# Patient Record
Sex: Female | Born: 1975
Health system: Southern US, Community
[De-identification: ages and names within clinical notes are randomized; demographics above are authoritative.]

## PROBLEM LIST (undated history)

## (undated) DIAGNOSIS — J45909 Unspecified asthma, uncomplicated: Secondary | ICD-10-CM

## (undated) DIAGNOSIS — T7840XA Allergy, unspecified, initial encounter: Secondary | ICD-10-CM

## (undated) DIAGNOSIS — K219 Gastro-esophageal reflux disease without esophagitis: Secondary | ICD-10-CM

## (undated) DIAGNOSIS — F411 Generalized anxiety disorder: Secondary | ICD-10-CM

## (undated) DIAGNOSIS — E559 Vitamin D deficiency, unspecified: Secondary | ICD-10-CM

## (undated) HISTORY — PX: TONSILLECTOMY AND ADENOIDECTOMY: SUR1326

## (undated) HISTORY — DX: Unspecified asthma, uncomplicated: J45.909

## (undated) HISTORY — DX: Allergy, unspecified, initial encounter: T78.40XA

## (undated) HISTORY — DX: Vitamin D deficiency, unspecified: E55.9

## (undated) HISTORY — DX: Gastro-esophageal reflux disease without esophagitis: K21.9

## (undated) HISTORY — DX: Generalized anxiety disorder: F41.1

---

## 1998-12-25 ENCOUNTER — Other Ambulatory Visit: Admission: RE | Admit: 1998-12-25 | Discharge: 1998-12-25 | Payer: Self-pay | Admitting: Obstetrics and Gynecology

## 2000-01-07 ENCOUNTER — Other Ambulatory Visit: Admission: RE | Admit: 2000-01-07 | Discharge: 2000-01-07 | Payer: Self-pay | Admitting: *Deleted

## 2000-06-08 ENCOUNTER — Ambulatory Visit (HOSPITAL_COMMUNITY): Admission: RE | Admit: 2000-06-08 | Discharge: 2000-06-08 | Payer: Self-pay | Admitting: Obstetrics and Gynecology

## 2000-06-08 ENCOUNTER — Encounter: Payer: Self-pay | Admitting: Obstetrics and Gynecology

## 2000-06-22 ENCOUNTER — Emergency Department (HOSPITAL_COMMUNITY): Admission: EM | Admit: 2000-06-22 | Discharge: 2000-06-22 | Payer: Self-pay | Admitting: Emergency Medicine

## 2000-07-02 ENCOUNTER — Emergency Department (HOSPITAL_COMMUNITY): Admission: EM | Admit: 2000-07-02 | Discharge: 2000-07-02 | Payer: Self-pay | Admitting: Emergency Medicine

## 2001-01-12 ENCOUNTER — Other Ambulatory Visit: Admission: RE | Admit: 2001-01-12 | Discharge: 2001-01-12 | Payer: Self-pay | Admitting: Obstetrics and Gynecology

## 2001-02-10 ENCOUNTER — Encounter (INDEPENDENT_AMBULATORY_CARE_PROVIDER_SITE_OTHER): Payer: Self-pay | Admitting: *Deleted

## 2001-02-10 ENCOUNTER — Ambulatory Visit (HOSPITAL_COMMUNITY): Admission: RE | Admit: 2001-02-10 | Discharge: 2001-02-10 | Payer: Self-pay

## 2002-04-05 ENCOUNTER — Encounter: Admission: RE | Admit: 2002-04-05 | Discharge: 2002-04-05 | Payer: Self-pay | Admitting: Obstetrics and Gynecology

## 2002-06-20 ENCOUNTER — Inpatient Hospital Stay (HOSPITAL_COMMUNITY): Admission: AD | Admit: 2002-06-20 | Discharge: 2002-06-23 | Payer: Self-pay | Admitting: Obstetrics & Gynecology

## 2002-07-18 ENCOUNTER — Other Ambulatory Visit: Admission: RE | Admit: 2002-07-18 | Discharge: 2002-07-18 | Payer: Self-pay | Admitting: Obstetrics & Gynecology

## 2003-07-05 ENCOUNTER — Other Ambulatory Visit: Admission: RE | Admit: 2003-07-05 | Discharge: 2003-07-05 | Payer: Self-pay | Admitting: Obstetrics and Gynecology

## 2004-07-05 ENCOUNTER — Other Ambulatory Visit: Admission: RE | Admit: 2004-07-05 | Discharge: 2004-07-05 | Payer: Self-pay | Admitting: Obstetrics and Gynecology

## 2005-07-25 ENCOUNTER — Other Ambulatory Visit: Admission: RE | Admit: 2005-07-25 | Discharge: 2005-07-25 | Payer: Self-pay | Admitting: Obstetrics and Gynecology

## 2008-09-30 ENCOUNTER — Emergency Department (HOSPITAL_COMMUNITY): Admission: EM | Admit: 2008-09-30 | Discharge: 2008-09-30 | Payer: Self-pay | Admitting: Emergency Medicine

## 2008-10-05 ENCOUNTER — Ambulatory Visit (HOSPITAL_COMMUNITY): Admission: RE | Admit: 2008-10-05 | Discharge: 2008-10-05 | Payer: Self-pay | Admitting: Gastroenterology

## 2008-10-13 ENCOUNTER — Encounter: Admission: RE | Admit: 2008-10-13 | Discharge: 2008-10-13 | Payer: Self-pay | Admitting: Gastroenterology

## 2008-11-08 ENCOUNTER — Ambulatory Visit (HOSPITAL_COMMUNITY): Admission: RE | Admit: 2008-11-08 | Discharge: 2008-11-08 | Payer: Self-pay | Admitting: Gastroenterology

## 2009-01-04 ENCOUNTER — Encounter: Admission: RE | Admit: 2009-01-04 | Discharge: 2009-01-04 | Payer: Self-pay | Admitting: Gastroenterology

## 2010-07-05 IMAGING — US US ABDOMEN COMPLETE
1 series · 14 of 25 positions shown · non-contrast
Comparison: Abdominal radiographs 10/05/2008.

CLINICAL DATA: Right upper quadrant abdominal pain.

ABDOMEN ULTRASOUND
TECHNIQUE: Complete abdominal ultrasound examination was performed
including evaluation of the liver, gallbladder, bile ducts,
pancreas, kidneys, spleen, IVC, and abdominal aorta.

[Series 1: unknown · 0.28mm/px · 14 of 60 slices shown]
[im 1/60]
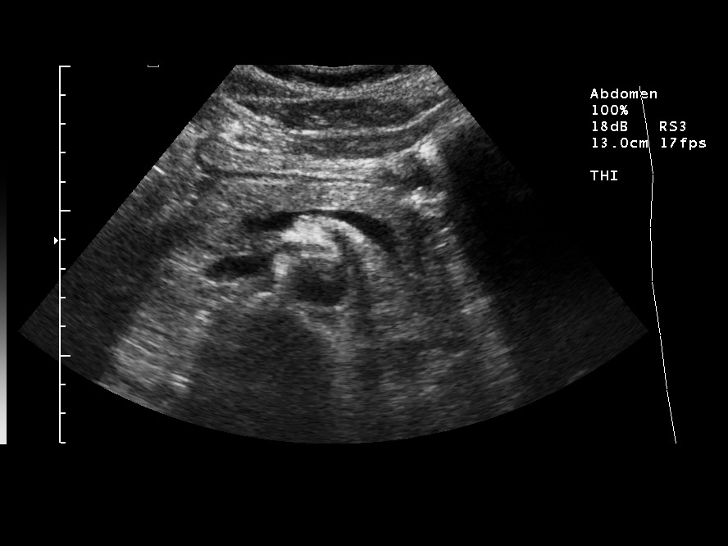
[im 5/60]
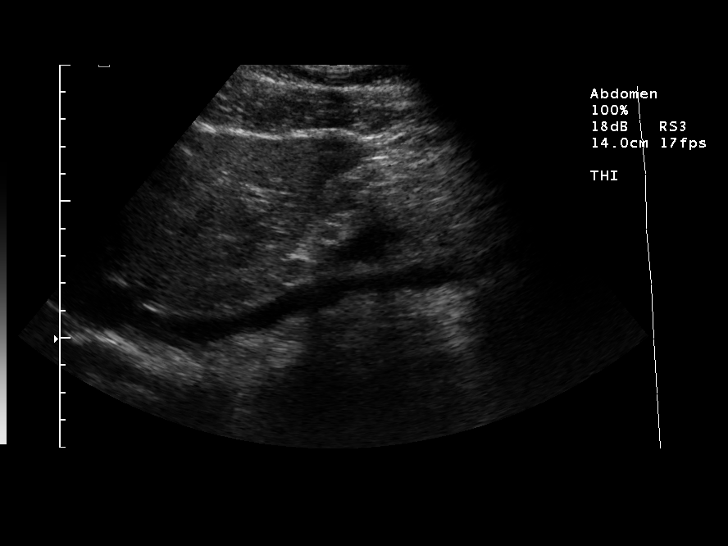
[im 10/60]
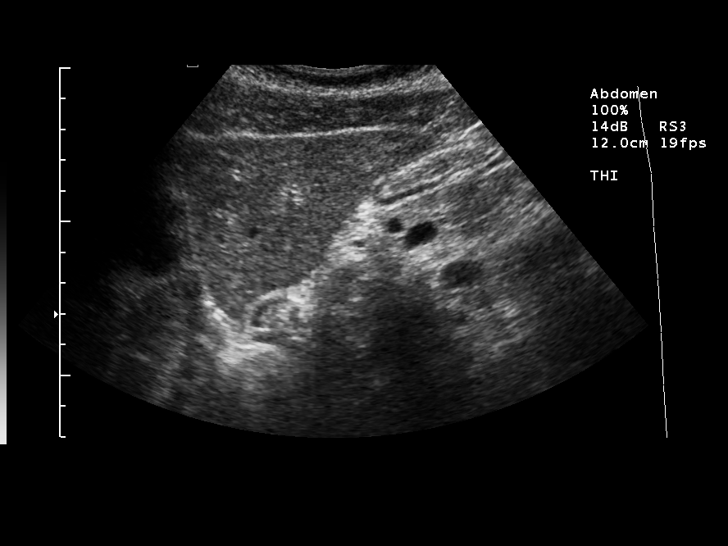
[im 15/60]
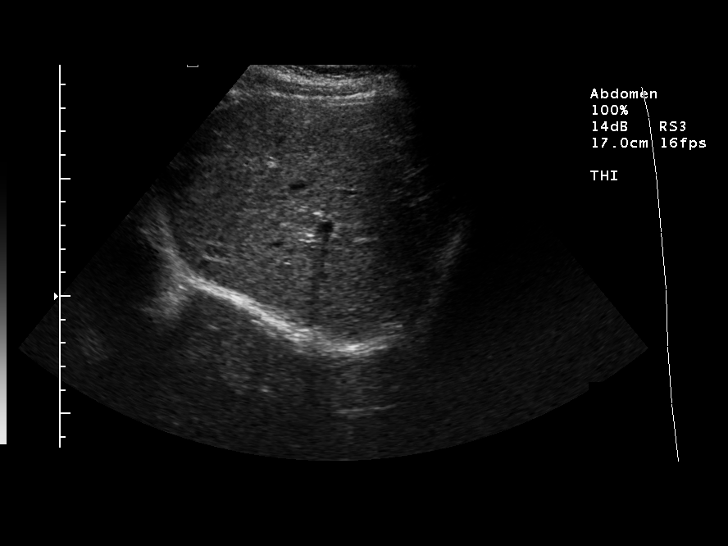
[im 20/60]
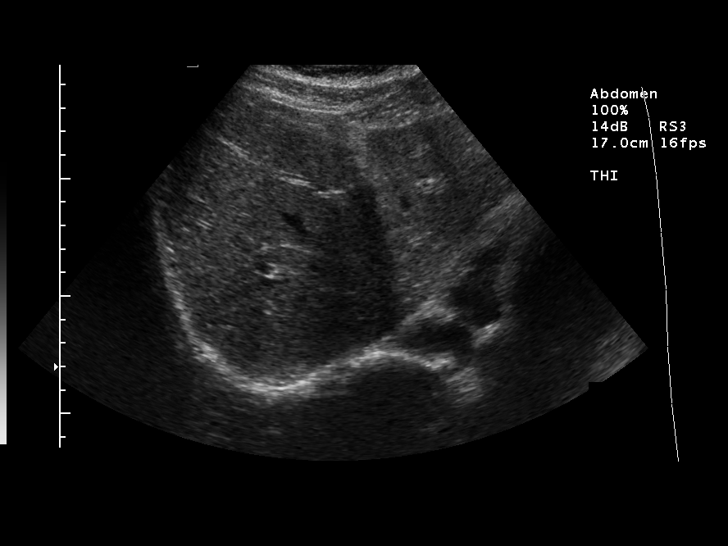
[im 23/60]
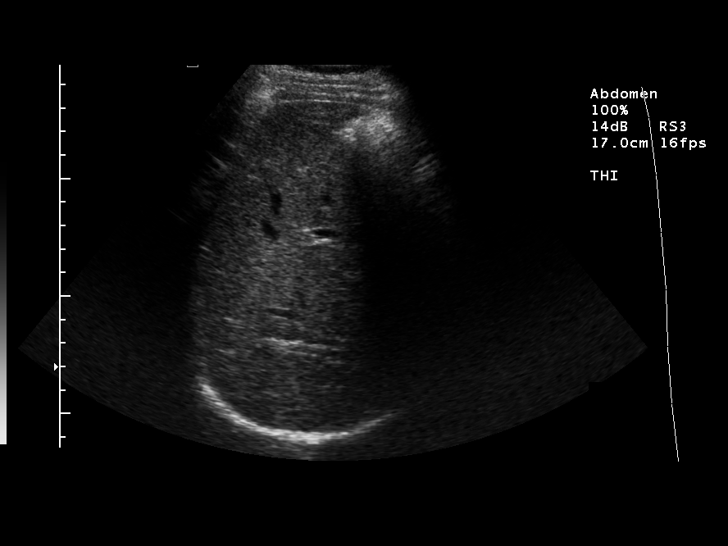
[im 28/60]
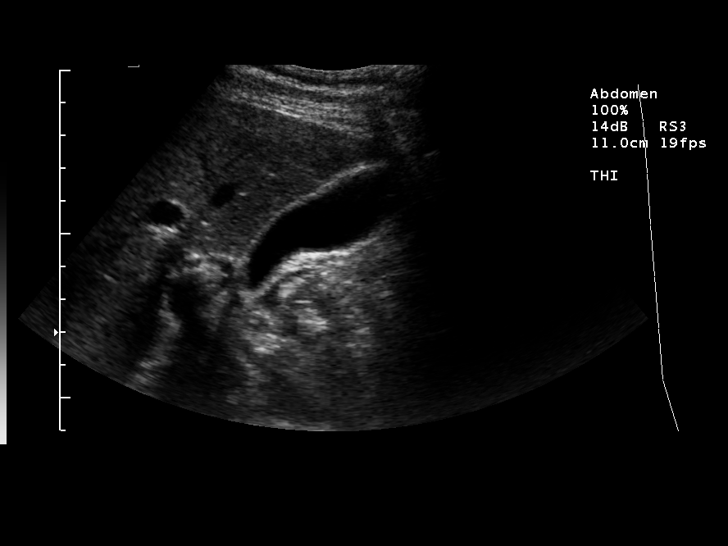
[im 32/60]
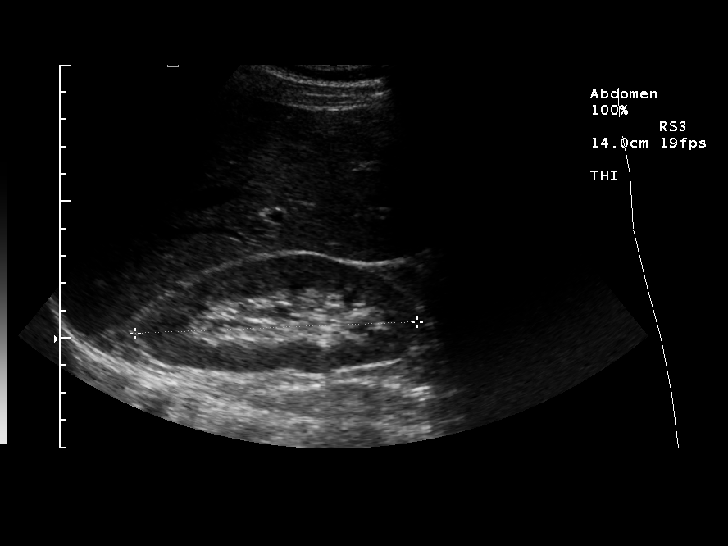
[im 37/60]
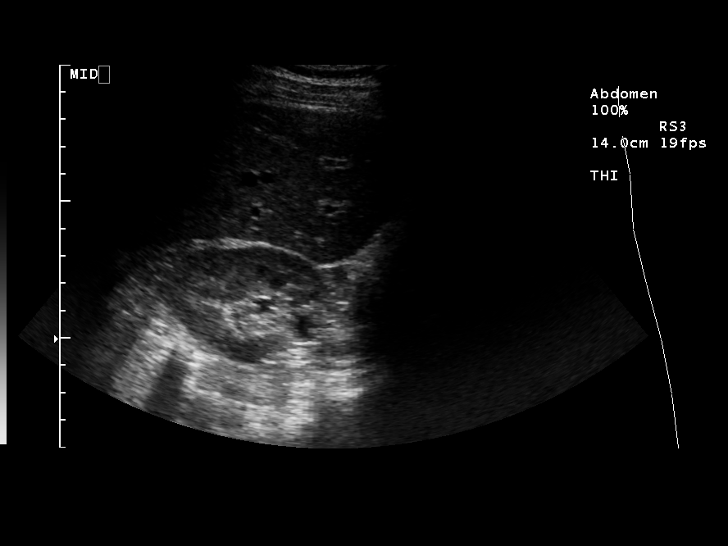
[im 40/60]
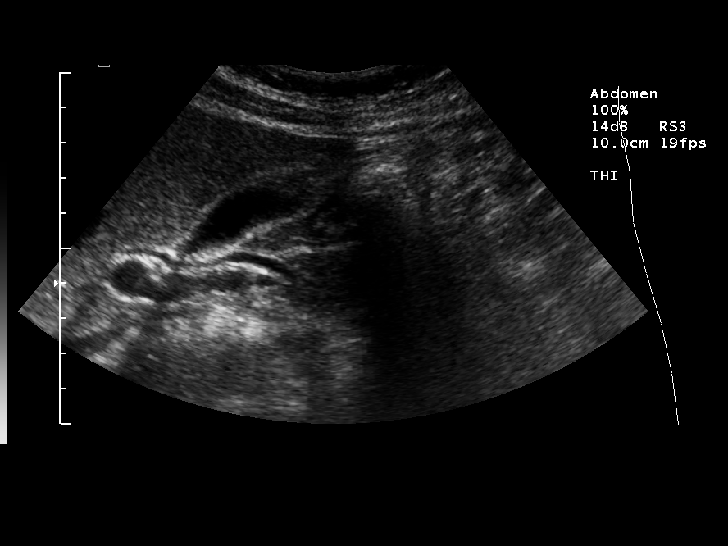
[im 45/60]
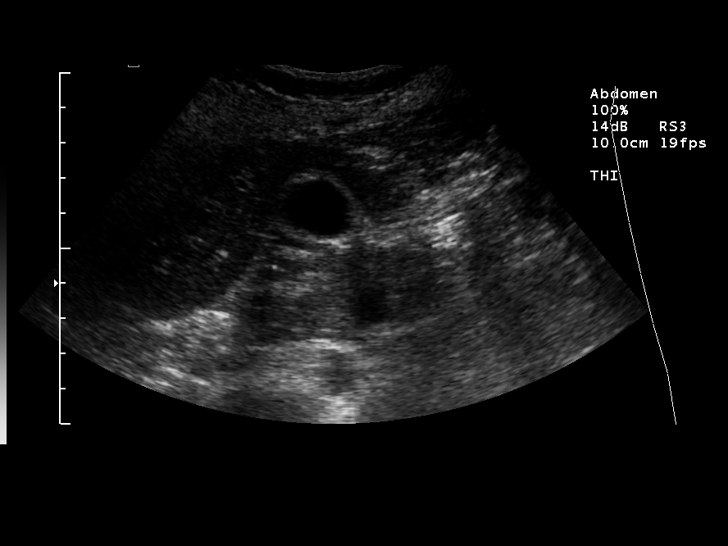
[im 50/60]
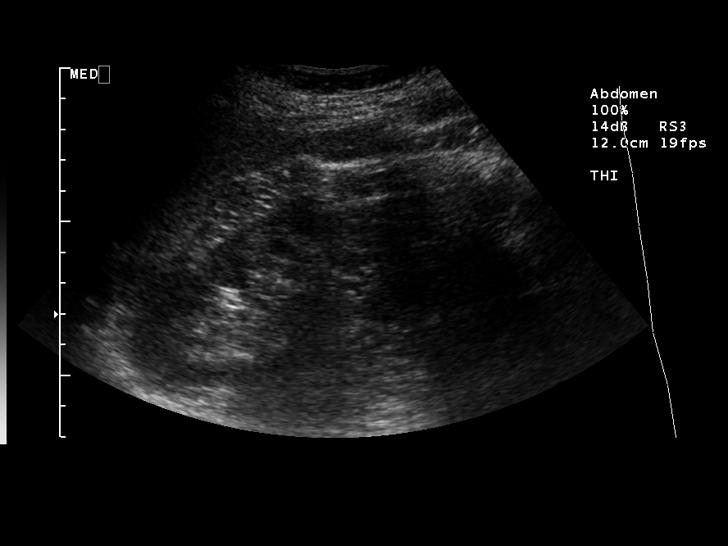
[im 55/60]
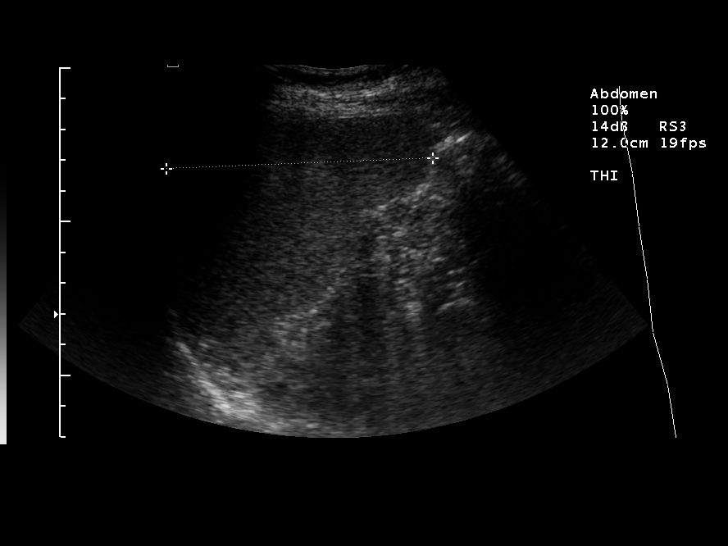
[im 60/60]
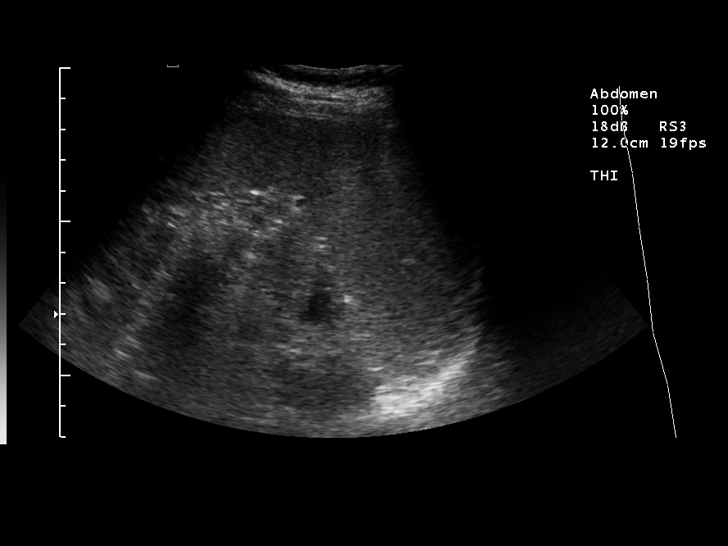

[14 of 25 positions shown; findings below may reference images not displayed]

FINDINGS: The gallbladder appears normal without gallstones or wall
thickening.  There is no biliary dilatation.  The visualized
portions of the liver, spleen, pancreas, IVC and abdominal aorta
appear normal.

Both kidneys appear normal.  The right kidney measures 10.3 cm in
length and the left kidney measures 11.8 cm in length.  There is no
hydronephrosis.
IMPRESSION: No acute abdominal findings.

## 2010-12-15 ENCOUNTER — Encounter: Payer: Self-pay | Admitting: Gastroenterology

## 2011-04-11 NOTE — Discharge Summary (Signed)
   NAMELAUREL, HARNDEN                         ACCOUNT NO.:  [id number]   MEDICAL RECORD NO.:  [id number]                   PATIENT TYPE:  INP   LOCATION:  9102                                 FACILITY:  [facility]   PHYSICIAN:  Malva Limes, MD                   DATE OF BIRTH:  [birth year]   DATE OF ADMISSION:  06/20/2002  DATE OF DISCHARGE:  06/23/2002                                 DISCHARGE SUMMARY   FINAL DIAGNOSES:  Intrauterine pregnancy at term, breech presentation.   PROCEDURE:  Primary low-transverse cesarean section.   SURGEON:  Gerrit Friends. Aldona Bar, M.D.   ASSISTANT:  Lodema Hong, M.D.   COMPLICATIONS:  None.   HISTORY:  This 35 year old G1, P0 was admitted to the hospital on June 20, 2002 for cesarean section secondary to breech presentation.  Patient was  taken to the operating room where primary low-transverse cesarean section  was performed with the delivery of a 7 pound 9 ounce female infant with  Apgar's of 9 and 10.  She delivered without complications.  Patient's  postoperative course was complicated by some postoperative anemia.  Her  hemoglobin was 8.3, which was stable and she was started on some iron.  She  was felt ready for discharge on postoperative day 3.   DISCHARGE INSTRUCTIONS:  1. She was sent home on a regular diet.  2. She was told to decrease activities.  3. She was told to continue prenatal vitamins.  4. Prescription for Tylox to take one to two every 4 hours as needed for     pain.   FOLLOW UP:  She is to follow up in the office in four weeks.      Michelle B. Rialto, PA-C                Malva Limes, MD    MBB/MEDQ  D:  07/26/2002  T:  07/26/2002  Job:  [id number]

## 2011-04-11 NOTE — Op Note (Signed)
Christine [name], Christine [name]                         ACCOUNT NO.:  [id number]   MEDICAL RECORD NO.:  [id number]                   PATIENT TYPE:  INP   LOCATION:  9102                                 FACILITY:  [facility]   PHYSICIAN:  Gerrit Friends. Aldona Bar, M.D.                DATE OF BIRTH:  Nov 10, 1976   DATE OF PROCEDURE:  06/20/2002  DATE OF DISCHARGE:  06/23/2002                                 OPERATIVE REPORT   PREOPERATIVE DIAGNOSES:  Primigravida breech at term.   POSTOPERATIVE DIAGNOSES:  Primigravida breech at term.  Plus delivery of 7  pound 9 ounce female infant, Apgars 9 and 10.   PROCEDURE:  Primary low transverse cesarean section.   SURGEON:  Gerrit Friends. Aldona Bar, M.D.   ASSISTANT:  Luvenia Redden, M.D.   ANESTHESIA:  Spinal by Quillian Quince, M.D.   INDICATIONS FOR PROCEDURE:  This 35 year old primigravida was known to have  a breech presentation and presents at this time for delivery by primary low  transverse cesarean section having had all options discussed with her in the  office. She is taken to the operating room for delivery.   DESCRIPTION OF PROCEDURE:  The patient was taken to the operating room where  after the satisfactory induction of spinal anesthetic by Dr. Arby Barrette, she  was prepped and draped in the usual sterile fashion.  A Foley catheter was  placed as part of the prep.  Once good anesthetic levels were documented,  with the patient being placed in the supine position slightly tilted to the  left, the procedure was begun.  The Pfannenstiel incision was made and with  minimal difficulty and good hemostasis, was dissected down to and through  the fascia in a low transverse fashion. Subfascial space was created  inferiorly and superiorly.  The muscles were separated in the midline.  The  peritoneum was identified and entered appropriately with care taken to avoid  the bowel superiorly and the bladder inferiorly.  At this time, the  vesicouterine peritoneum was incised in  a low transverse fashion and pushed  off the lower uterine segment with ease.  Sharp incision to the uterus in  the low transverse fashion was then carried out with Metzenbaum scissors and  extended laterally.  Amniotomy was produced with production of clear fluid.  Thereafter the baby was delivered from the frank breech presentation.  The  shoulders were delivered without difficulty using the modified Love seat  maneuver.  Once the infant had been delivered and the cord had been clamped  and cut, the infant was passed off to the awaiting team.  Apgars were noted  to be 9 and 10.  Subsequent weight was found to be 7 pounds 9 ounces.  The  baby was taken to the nursery in good condition. After the cord blood was  collected, the placenta was delivered intact.  The uterus was then  exteriorized  and rendered free of any remaining products of conception.  Good uterine contractility was afforded with slowly given intravenous  Pitocin and manual stimulation.  Closure of the uterus was carried out using  a single layer of #1 Vicryl in a running locking fashion and this was  reinforced with several figure-of-eight #1 Vicryls.  Tubes and ovaries  appeared normal.  At this time, the incision was noted to be dry, and the  uterus was replaced in the abdominal cavity after the abdomen lavaged of all  free blood and clot.  At this time after all counts were noted to be correct  and no foreign bodies were noted to be remaining in the abdominal cavity,  closure of the abdomen was begun in layers.  The abdominal peritoneum was  closed with 0 Vicryl in a running fashion and the muscles secured with the  same.  Assured of good subfascial hemostasis, the fascia was reapproximated  with 0 Vicryl from angle to midline bilaterally.  The subcutaneous tissue  was rendered hemostatic and staples were then used to close the skin.  A  sterile pressure dressing was applied and the patient at this time was  transported  to the recovery room in satisfactory condition having tolerated  the procedure well.  Estimated blood loss was 500 cc.  All needle, sponge,  and instrument count correct x2. In summary, this patient was a primigravida  breech and was delivered at term by primary low transverse cesarean section.  At the conclusion of the procedure, both mother and baby were doing well in  their respective recovery areas.                                                Gerrit Friends. Aldona Bar, M.D.    RMW/MEDQ  D:  06/20/2002  T:  06/23/2002  Job:  [id number]

## 2011-04-11 NOTE — Op Note (Signed)
[facility]  Patient:    Christine [name], Christine [name]                      MRN: [id number] Adm. Date:  [id number] Attending:  Silverio Lay A                           Operative Report  PREOPERATIVE DIAGNOSIS:       Infertility with questionable hysterosalpingogram.  POSTOPERATIVE DIAGNOSES:      1. Left paratubal cyst.                               2. Bilateral tubal patency.                               3. Posterior cul-de-sac peritoneal retraction                                  with possible endometriosis.  PROCEDURES:                   1. Diagnostic laparoscopy with peritoneal                                  biopsy x 3.                               2. Chromopertubation.  SURGEON:                      Silverio Lay, M.D.  ANESTHESIA:                   General.  ESTIMATED BLOOD LOSS:         Minimal.  DESCRIPTION OF PROCEDURE:     After being informed of the planned procedure with the possible complications including bleeding; infection; injury to bowel, bladder or ureter and the need for laparotomy, informed consent was obtained.  The patient was taken to OR #2, given general anesthesia with endotracheal intubation and placed in the lithotomy position,  She was prepped and draped in a sterile fashion and the bladder was emptied with a Foley catheter.  GYN exam revealed an anteverted uterus, normal in size and shape; two normal adnexa.  A speculum was inserted.  The anterior lip of the cervix was grasped with a tenaculum and an acorn manipulator was inserted, which was hooked to a line for methylene blue chromopertubation.  We then proceeded with an umbilical incision was 1 cm with a knife, insertion of a Veress needle and insufflation of pneumoperitoneum with a maximum pressure of 15 mmHg CO2.  We then tried to insert a 10 mm trocar, but the patients muscle habitus and thinness made it difficult to safely push in a 10 mm trocar while we were unable  to pull up her abdominal wall.  We then decided to proceed with an open approach.  Using skin retractors, we directly visualized fascia, which was opened with the knife.  Two sutures of 0 Vicryl were placed on each angle. The peritoneum was then entered bluntly with a hemostat clamp.  A Hasson 10 mm trocar  was then placed and held in place with the two sutures previously placed.  We then proceeded with reinsufflation of pneumoperitoneum and could insert a camera mounted on the video monitor.  OBSERVATION:                  The anterior cul-de-sac was normal.  The uterus was normal in shape and size.  Both tubes were normal except for the small paratubal cyst on the left side measuring 0.5 cm.  Both ovaries were normal. The posterior cul-de-sac showed on the left uterosacral ligament three areas of peritoneal retraction with no definite lesion.  Because these retractions could very well be signs of minimal endometriosis, we decided to proceed with biopsy and a 5 mm trocar was inserted suprapubically under direct visualization.  We proceeded with punch biopsy to do three small peritoneal biopsies without significant bleeding.  Using bipolar cautery, we then cauterized those three sites.  We finally proceeded with methylene blue chromopertubation, which revealed two patent tubes without any difficulty in filling.  We then removed trocars after evacuating the pneumoperitoneum, closed the previously placed sutures on the fascia of the umbilical incision after assessing that no bowel loop was in the incision.  The skin of both incisions was closed with Dermabond after infiltrating with Marcaine 0.25, 8 cc.  Instrument and sponge count was complete x 2.  Estimated blood loss was minimal.  The procedure was very well tolerated by the patient, without was taken to the recovery room in a well and stable condition. DD:  02/10/01 TD:  02/11/01 Job: [id number]

## 2013-02-10 ENCOUNTER — Ambulatory Visit (INDEPENDENT_AMBULATORY_CARE_PROVIDER_SITE_OTHER): Payer: 59 | Admitting: General Practice

## 2013-02-10 ENCOUNTER — Encounter: Payer: Self-pay | Admitting: General Practice

## 2013-02-10 VITALS — BP 138/76 | HR 78 | Temp 97.2°F | Ht 65.0 in | Wt 176.0 lb

## 2013-02-10 DIAGNOSIS — F411 Generalized anxiety disorder: Secondary | ICD-10-CM

## 2013-02-10 DIAGNOSIS — J45909 Unspecified asthma, uncomplicated: Secondary | ICD-10-CM

## 2013-02-10 DIAGNOSIS — E559 Vitamin D deficiency, unspecified: Secondary | ICD-10-CM | POA: Insufficient documentation

## 2013-02-10 DIAGNOSIS — J4530 Mild persistent asthma, uncomplicated: Secondary | ICD-10-CM | POA: Insufficient documentation

## 2013-02-10 MED ORDER — CITALOPRAM HYDROBROMIDE 20 MG PO TABS: 20.0000 mg | ORAL_TABLET | Freq: Every day | ORAL | Status: DC

## 2013-02-10 NOTE — Progress Notes (Signed)
  Subjective:    Patient ID: Christine [name], female    DOB: 1976-09-22, 37 y.o.   MRN: [id number]  Asthma She complains of cough, sputum production and wheezing. There is no chest tightness. Primary symptoms comments: Reports this only occurs when upset or very anxious . This is a chronic problem. The current episode started more than 1 year ago. The problem occurs rarely. The problem has been rapidly improving. The cough is productive of sputum. Pertinent negatives include no chest pain, dyspnea on exertion, sneezing or sore throat. Her symptoms are aggravated by emotional stress (allergens). Her symptoms are alleviated by rest (anti-anxiety med). She reports significant improvement on treatment. There are no known risk factors for lung disease. Her past medical history is significant for asthma.  Anxiety Symptoms include excessive worry and nervous/anxious behavior. Patient reports no chest pain, confusion, dizziness, nausea or suicidal ideas. Symptoms occur occasionally. The severity of symptoms is moderate. The patient sleeps 6 hours per night. The quality of sleep is fair. Nighttime awakenings: occasional.   Her past medical history is significant for asthma. Compliance with medications is 76-100%.  Patient expressed concern about weight of 176lb, which is her pregancy weight. Verbalizes she feels better around 130lbs.     Review of Systems  HENT: Negative for sore throat and sneezing.   Respiratory: Positive for cough, sputum production and wheezing.   Cardiovascular: Negative for chest pain and dyspnea on exertion.  Gastrointestinal: Negative for nausea.  Skin: Negative.   Neurological: Negative for dizziness.  Psychiatric/Behavioral: Negative for suicidal ideas and confusion. The patient is nervous/anxious.        Objective:   Physical Exam  Constitutional: She is oriented to person, place, and time. She appears well-developed and well-nourished.  Eyes: Pupils are equal, round, and  reactive to light.  Neck: Normal range of motion.  Cardiovascular: Normal rate, regular rhythm and normal heart sounds.   No murmur heard. Pulmonary/Chest: Effort normal and breath sounds normal.  Abdominal: Soft.  Musculoskeletal: Normal range of motion.  Neurological: She is alert and oriented to person, place, and time. She has normal reflexes.  Skin: Skin is warm and dry.  Psychiatric: She has a normal mood and affect.   Gad-7 score=2       Assessment & Plan:  Continue current medications as prescribed Discussed increase in physical activity, healthy eating habits for weight management discussed stress management   Raymon Mutton, FNP-C

## 2013-02-10 NOTE — Patient Instructions (Signed)
Anxiety and Panic Attacks Your caregiver has informed you that you are having an anxiety or panic attack. There may be many forms of this. Most of the time these attacks come suddenly and without warning. They come at any time of day, including periods of sleep, and at any time of life. They may be strong and unexplained. Although panic attacks are very scary, they are physically harmless. Sometimes the cause of your anxiety is not known. Anxiety is a protective mechanism of the body in its fight or flight mechanism. Most of these perceived danger situations are actually nonphysical situations (such as anxiety over losing a job). CAUSES  The causes of an anxiety or panic attack are many. Panic attacks may occur in otherwise healthy people given a certain set of circumstances. There may be a genetic cause for panic attacks. Some medications may also have anxiety as a side effect. SYMPTOMS  Some of the most common feelings are:  Intense terror.  Dizziness, feeling faint.  Hot and cold flashes.  Fear of going crazy.  Feelings that nothing is real.  Sweating.  Shaking.  Chest pain or a fast heartbeat (palpitations).  Smothering, choking sensations.  Feelings of impending doom and that death is near.  Tingling of extremities, this may be from over-breathing.  Altered reality (derealization).  Being detached from yourself (depersonalization). Several symptoms can be present to make up anxiety or panic attacks. DIAGNOSIS  The evaluation by your caregiver will depend on the type of symptoms you are experiencing. The diagnosis of anxiety or panic attack is made when no physical illness can be determined to be a cause of the symptoms. TREATMENT  Treatment to prevent anxiety and panic attacks may include:  Avoidance of circumstances that cause anxiety.  Reassurance and relaxation.  Regular exercise.  Relaxation therapies, such as yoga.  Psychotherapy with a psychiatrist or  therapist.  Avoidance of caffeine, alcohol and illegal drugs.  Prescribed medication. SEEK IMMEDIATE MEDICAL CARE IF:   You experience panic attack symptoms that are different than your usual symptoms.  You have any worsening or concerning symptoms. Document Released: 11/10/2005 Document Revised: 02/02/2012 Document Reviewed: 03/14/2010 [facility] Patient Information 2013 [place], [place].   Asthma, Adult Asthma is caused by narrowing of the air passages in the lungs. It may be triggered by pollen, dust, animal dander, molds, some foods, respiratory infections, exposure to smoke, exercise, emotional stress or other allergens (things that cause allergic reactions or allergies). Repeat attacks are common. HOME CARE INSTRUCTIONS   Use prescription medications as ordered by your caregiver.  Avoid pollen, dust, animal dander, molds, smoke and other things that cause attacks at home and at work.  You may have fewer attacks if you decrease dust in your home. Electrostatic air cleaners may help.  It may help to replace your pillows or mattress with materials less likely to cause allergies.  Talk to your caregiver about an action plan for managing asthma attacks at home, including, the use of a peak flow meter which measures the severity of your asthma attack. An action plan can help minimize or stop the attack without having to seek medical care.  If you are not on a fluid restriction, drink 8 to 10 glasses of water each day.  Always have a plan prepared for seeking medical attention, including, calling your physician, accessing local emergency care, and calling 911 (in the [place] for a severe attack.  Discuss possible exercise routines with your caregiver.  If animal dander is the cause  of asthma, you may need to get rid of pets. SEEK MEDICAL CARE IF:   You have wheezing and shortness of breath even if taking medicine to prevent attacks.  You have muscle aches, chest pain or thickening of  sputum.  Your sputum changes from clear or white to yellow, green, gray, or bloody.  You have any problems that may be related to the medicine you are taking (such as a rash, itching, swelling or trouble breathing). SEEK IMMEDIATE MEDICAL CARE IF:   Your usual medicines do not stop your wheezing or there is increased coughing and/or shortness of breath.  You have increased difficulty breathing.  You have a fever. MAKE SURE YOU:   Understand these instructions.  Will watch your condition.  Will get help right away if you are not doing well or get worse. Document Released: 11/10/2005 Document Revised: 02/02/2012 Document Reviewed: 06/28/2008 [facility] Patient Information 2013 [place], [place].

## 2013-05-09 ENCOUNTER — Ambulatory Visit: Payer: 59 | Admitting: Nurse Practitioner

## 2013-06-14 ENCOUNTER — Other Ambulatory Visit: Payer: Self-pay | Admitting: *Deleted

## 2013-06-14 DIAGNOSIS — F411 Generalized anxiety disorder: Secondary | ICD-10-CM

## 2013-06-14 MED ORDER — CITALOPRAM HYDROBROMIDE 20 MG PO TABS: 20.0000 mg | ORAL_TABLET | Freq: Every day | ORAL | Status: DC

## 2013-06-14 NOTE — Telephone Encounter (Signed)
LAST OV 3/14

## 2013-07-06 ENCOUNTER — Ambulatory Visit (INDEPENDENT_AMBULATORY_CARE_PROVIDER_SITE_OTHER): Payer: BC Managed Care – PPO | Admitting: Nurse Practitioner

## 2013-07-06 VITALS — BP 134/74 | HR 78 | Temp 97.8°F | Ht 65.0 in | Wt 184.0 lb

## 2013-07-06 DIAGNOSIS — F32A Depression, unspecified: Secondary | ICD-10-CM

## 2013-07-06 DIAGNOSIS — F411 Generalized anxiety disorder: Secondary | ICD-10-CM

## 2013-07-06 DIAGNOSIS — F3289 Other specified depressive episodes: Secondary | ICD-10-CM

## 2013-07-06 DIAGNOSIS — F329 Major depressive disorder, single episode, unspecified: Secondary | ICD-10-CM

## 2013-07-06 MED ORDER — CITALOPRAM HYDROBROMIDE 20 MG PO TABS: 20.0000 mg | ORAL_TABLET | Freq: Every day | ORAL | Status: DC

## 2013-07-06 NOTE — Progress Notes (Signed)
  Subjective:    Patient ID: Christine [name], female    DOB: [birth year], 37 y.o.   MRN: [id number]  HPI  Patinet here today for refill on celexa- she has been taking for awhile for depression- Patient says that it helps with her anxiety- keeps her calm- Wants to remain on current dose.    Review of Systems  All other systems reviewed and are negative.       Objective:   Physical Exam  Constitutional: She appears well-developed and well-nourished.  Cardiovascular: Normal rate and normal heart sounds.   Pulmonary/Chest: Effort normal and breath sounds normal.  Psychiatric: She has a normal mood and affect. Her behavior is normal. Judgment and thought content normal.    BP 134/74  Pulse 78  Temp(Src) 97.8 F (36.6 C) (Oral)  Ht 5\' 5"  (1.651 m)  Wt 184 lb (83.462 kg)  BMI 30.62 kg/m2  LMP 07/06/2013       Assessment & Plan:   1. Depression   2. GAD (generalized anxiety disorder)   3. Generalized anxiety disorder    Meds ordered this encounter  Medications  . citalopram (CELEXA) 20 MG tablet    Sig: Take 1 tablet (20 mg total) by mouth daily.    Dispense:  30 tablet    Refill:  11    Order Specific Question:  Supervising Provider    Answer:  Ernestina Penna [[id number]]   Stress management Flo[place] up in 6  Months  Mary-Margaret Daphine Deutscher, FNP

## 2013-07-06 NOTE — Patient Instructions (Signed)

## 2013-07-08 ENCOUNTER — Ambulatory Visit: Payer: 59 | Admitting: Nurse Practitioner

## 2013-08-08 ENCOUNTER — Telehealth: Payer: Self-pay | Admitting: Nurse Practitioner

## 2013-08-08 MED ORDER — AZITHROMYCIN 250 MG PO TABS: ORAL_TABLET | ORAL | Status: DC

## 2013-08-08 NOTE — Telephone Encounter (Signed)
zpak rx sent to pharmacy 

## 2013-08-08 NOTE — Telephone Encounter (Signed)
Please advise 

## 2014-03-07 ENCOUNTER — Ambulatory Visit (INDEPENDENT_AMBULATORY_CARE_PROVIDER_SITE_OTHER): Payer: BC Managed Care – PPO | Admitting: General Practice

## 2014-03-07 VITALS — BP 147/92 | HR 73 | Temp 97.4°F | Wt 194.6 lb

## 2014-03-07 DIAGNOSIS — W57XXXA Bitten or stung by nonvenomous insect and other nonvenomous arthropods, initial encounter: Secondary | ICD-10-CM

## 2014-03-07 DIAGNOSIS — J209 Acute bronchitis, unspecified: Secondary | ICD-10-CM

## 2014-03-07 DIAGNOSIS — T148 Other injury of unspecified body region: Secondary | ICD-10-CM

## 2014-03-07 DIAGNOSIS — L0390 Cellulitis, unspecified: Secondary | ICD-10-CM

## 2014-03-07 DIAGNOSIS — L0291 Cutaneous abscess, unspecified: Secondary | ICD-10-CM

## 2014-03-07 MED ORDER — PREDNISONE (PAK) 10 MG PO TABS: ORAL_TABLET | ORAL | Status: DC

## 2014-03-07 MED ORDER — DOXYCYCLINE HYCLATE 100 MG PO TABS: ORAL_TABLET | ORAL | Status: DC

## 2014-03-07 MED ORDER — METHYLPREDNISOLONE ACETATE 80 MG/ML IJ SUSP
80.0000 mg | Freq: Once | INTRAMUSCULAR | Status: AC
  Administered 2014-03-07: 80 mg via INTRAMUSCULAR

## 2014-03-07 NOTE — Patient Instructions (Signed)

## 2014-03-07 NOTE — Progress Notes (Signed)
   Subjective:    Patient ID: Christine MontaneJenny H [name], female    DOB: 06/11/1976, 38 y.o.   MRN: [id number]  Cough This is a new problem. The current episode started in the past 7 days. The problem has been waxing and waning. The cough is non-productive. Associated symptoms include wheezing. Pertinent negatives include no chest pain, chills, fever or shortness of breath. She has tried nothing for the symptoms. Her past medical history is significant for asthma and bronchitis.   Patient presents today with complaints of redness to right inner elbow area. Reports tick being removed from arm on Sunday and increased redness and swelling since then. Reports putting alcohol on area, Sunday and Monday. Last menstrual cycle 3 weeks ago and normal.     Review of Systems  Constitutional: Negative for fever and chills.  Respiratory: Positive for cough and wheezing. Negative for chest tightness and shortness of breath.   Cardiovascular: Negative for chest pain and palpitations.  Skin:       Redness and warmth to right inner elbow area.       Objective:   Physical Exam  Constitutional: She is oriented to person, place, and time. She appears well-developed and well-nourished.  Cardiovascular: Normal rate, regular rhythm and normal heart sounds.   Pulmonary/Chest: Effort normal. No respiratory distress. She has wheezes. She exhibits no tenderness.  Bronchial cough  Neurological: She is alert and oriented to person, place, and time.  Skin: Skin is warm and dry.  Right inner elbow area redness and warmth, 1/2 inch x 1/2 inch. Negative for drainage.   Psychiatric: She has a normal mood and affect.          Assessment & Plan:  1. Acute bronchitis  - methylPREDNISolone acetate (DEPO-MEDROL) injection 80 mg; Inject 1 mL (80 mg total) into the muscle once. - predniSONE (STERAPRED UNI-PAK) 10 MG tablet; Start on 03/08/14  Dispense: 21 tablet; Refill: 0  2. Tick bite, 3. Cellulitis  - doxycycline  (VIBRA-TABS) 100 MG tablet; Take one tablet twice a day on day one, then take one tablet daily for next 14 days  Dispense: 16 tablet; Refill: 0 -provided and discussed home care instructions -RTO prn -Patient verbalized understanding Christine KeensMae E. Lily Kernen, FNP-C

## 2014-03-20 ENCOUNTER — Ambulatory Visit (INDEPENDENT_AMBULATORY_CARE_PROVIDER_SITE_OTHER): Payer: BC Managed Care – PPO | Admitting: Family Medicine

## 2014-03-20 VITALS — BP 142/88 | HR 75 | Temp 97.9°F | Ht 65.0 in | Wt 193.2 lb

## 2014-03-20 DIAGNOSIS — R21 Rash and other nonspecific skin eruption: Secondary | ICD-10-CM

## 2014-03-20 DIAGNOSIS — L500 Allergic urticaria: Secondary | ICD-10-CM

## 2014-03-20 DIAGNOSIS — T7840XA Allergy, unspecified, initial encounter: Secondary | ICD-10-CM

## 2014-03-20 MED ORDER — HYDROXYZINE HCL 25 MG PO TABS: 25.0000 mg | ORAL_TABLET | Freq: Three times a day (TID) | ORAL | Status: DC | PRN

## 2014-03-20 MED ORDER — METHYLPREDNISOLONE (PAK) 4 MG PO TABS: ORAL_TABLET | ORAL | Status: DC

## 2014-03-20 MED ORDER — METHYLPREDNISOLONE ACETATE 80 MG/ML IJ SUSP
80.0000 mg | Freq: Once | INTRAMUSCULAR | Status: AC
  Administered 2014-03-20: 80 mg via INTRAMUSCULAR

## 2014-03-20 MED ORDER — EPINEPHRINE 0.3 MG/0.3ML IJ SOAJ: 0.3000 mg | Freq: Once | INTRAMUSCULAR | Status: DC

## 2014-03-20 NOTE — Progress Notes (Signed)
   Subjective:    Patient ID: Christine [name], female    DOB: 09/10/1976, 38 y.o.   MRN: [id number]  HPI This 38 y.o. female presents for evaluation of rash and hives.  She states she was on doxycycline and finished it and 3 days later she rashes. She is not sure what else could have caused this.  She has hx  Of urticaria and facial swelling due to allergens and allergic rxn's to abx's.  She was rx'd epi pen by Her allergist but she did not get it filled.   Review of Systems C/o rash and pruritis .No chest pain, SOB, HA, dizziness, vision change, N/V, diarrhea, constipation, dysuria, urinary urgency or frequency, myalgias, arthralgias.     Objective:   Physical Exam Vital signs noted  Well developed well nourished female.  HEENT - Head atraumatic Normocephalic                Eyes - PERRLA, Conjuctiva - clear Sclera- Clear EOMI                Ears - EAC's Wnl TM's Wnl Gross Hearing WNL                Throat - oropharanx wnl Respiratory - Lungs CTA bilateral Cardiac - RRR S1 and S2 without murmur GI - Abdomen soft Nontender and bowel sounds active x 4 Extremities - No edema. Neuro - Grossly intact. Skin - Urticaria over abdomen, chest, back, and arms      Assessment & Plan:  Allergic urticaria - Plan: hydrOXYzine (ATARAX/VISTARIL) 25 MG tablet, methylPREDNIsolone (MEDROL DOSPACK) 4 MG tablet, methylPREDNISolone acetate (DEPO-MEDROL) injection 80 mg, EPINEPHrine (EPI-PEN) 0.3 mg/0.3 mL SOAJ injection  Allergic reaction caused by a drug - Plan: hydrOXYzine (ATARAX/VISTARIL) 25 MG tablet, methylPREDNIsolone (MEDROL DOSPACK) 4 MG tablet, methylPREDNISolone acetate (DEPO-MEDROL) injection 80 mg, EPINEPHrine (EPI-PEN) 0.3 mg/0.3 mL SOAJ injection  Rash and nonspecific skin eruption - Plan: hydrOXYzine (ATARAX/VISTARIL) 25 MG tablet, methylPREDNIsolone (MEDROL DOSPACK) 4 MG tablet, methylPREDNISolone acetate (DEPO-MEDROL) injection 80 mg, EPINEPHrine (EPI-PEN) 0.3 mg/0.3 mL SOAJ  injection  Deatra CanterWilliam J Oxford FNP

## 2014-05-31 ENCOUNTER — Encounter (INDEPENDENT_AMBULATORY_CARE_PROVIDER_SITE_OTHER): Payer: Self-pay

## 2014-05-31 ENCOUNTER — Ambulatory Visit (INDEPENDENT_AMBULATORY_CARE_PROVIDER_SITE_OTHER): Payer: BC Managed Care – PPO | Admitting: Family Medicine

## 2014-05-31 ENCOUNTER — Encounter: Payer: Self-pay | Admitting: Family Medicine

## 2014-05-31 ENCOUNTER — Telehealth: Payer: Self-pay | Admitting: Nurse Practitioner

## 2014-05-31 VITALS — BP 141/90 | HR 87 | Temp 98.5°F | Ht 65.0 in | Wt 190.8 lb

## 2014-05-31 DIAGNOSIS — T50995A Adverse effect of other drugs, medicaments and biological substances, initial encounter: Secondary | ICD-10-CM

## 2014-05-31 DIAGNOSIS — L500 Allergic urticaria: Secondary | ICD-10-CM

## 2014-05-31 DIAGNOSIS — I10 Essential (primary) hypertension: Secondary | ICD-10-CM

## 2014-05-31 DIAGNOSIS — R21 Rash and other nonspecific skin eruption: Secondary | ICD-10-CM

## 2014-05-31 DIAGNOSIS — T7840XA Allergy, unspecified, initial encounter: Secondary | ICD-10-CM

## 2014-05-31 MED ORDER — METHYLPREDNISOLONE ACETATE 80 MG/ML IJ SUSP
80.0000 mg | Freq: Once | INTRAMUSCULAR | Status: AC
  Administered 2014-05-31: 80 mg via INTRAMUSCULAR

## 2014-05-31 MED ORDER — METHYLPREDNISOLONE (PAK) 4 MG PO TABS: ORAL_TABLET | ORAL | Status: DC

## 2014-05-31 NOTE — Progress Notes (Signed)
   Subjective:    Patient ID: Erich MontaneJenny H Belmonte, female    DOB: 07/11/1976, 38 y.o.   MRN: [id number]  HPI This 38 y.o. female presents for evaluation of rash on back and abdomen.  She has been having difficutly with urticaria.   Review of Systems C/o rash No chest pain, SOB, HA, dizziness, vision change, N/V, diarrhea, constipation, dysuria, urinary urgency or frequency, myalgias, arthralgias.     Objective:   Physical Exam  Vital signs noted  Well developed well nourished female.  HEENT - Head atraumatic Normocephalic                Eyes - PERRLA, Conjuctiva - clear Sclera- Clear EOMI                Ears - EAC's Wnl TM's Wnl Gross Hearing WNL                Nose - Nares patent                 Throat - oropharanx wnl Respiratory - Lungs CTA bilateral Cardiac - RRR S1 and S2 without murmur GI - Abdomen soft Nontender and bowel sounds active x 4 Extremities - No edema. Neuro - Grossly intact. Skin - Urticaria on back and chest    Assessment & Plan:  Allergic urticaria - Plan: methylPREDNISolone acetate (DEPO-MEDROL) injection 80 mg, methylPREDNIsolone (MEDROL DOSPACK) 4 MG tablet  Take zyrtec bid  Allergic reaction caused by a drug - Plan: methylPREDNISolone acetate (DEPO-MEDROL) injection 80 mg, methylPREDNIsolone (MEDROL DOSPACK) 4 MG tablet  Take zyrtec bid  Rash and nonspecific skin eruption - Plan: methylPREDNISolone acetate (DEPO-MEDROL) injection 80 mg, methylPREDNIsolone (MEDROL DOSPACK) 4 MG tablet  Take zyrtec bid  Follow up with allergist   Deatra CanterWilliam J Oxford FNP

## 2014-05-31 NOTE — Telephone Encounter (Signed)
appt given for today at 5 patient has not been seen since 02/2014

## 2014-06-01 NOTE — Telephone Encounter (Signed)
Appointment made

## 2014-07-17 ENCOUNTER — Other Ambulatory Visit: Payer: Self-pay | Admitting: *Deleted

## 2014-07-17 DIAGNOSIS — F411 Generalized anxiety disorder: Secondary | ICD-10-CM

## 2014-07-17 MED ORDER — CITALOPRAM HYDROBROMIDE 20 MG PO TABS: 20.0000 mg | ORAL_TABLET | Freq: Every day | ORAL | Status: DC

## 2014-10-13 ENCOUNTER — Other Ambulatory Visit: Payer: Self-pay | Admitting: Family Medicine

## 2014-11-03 ENCOUNTER — Encounter: Payer: Self-pay | Admitting: Family Medicine

## 2014-11-03 ENCOUNTER — Ambulatory Visit (INDEPENDENT_AMBULATORY_CARE_PROVIDER_SITE_OTHER): Payer: BC Managed Care – PPO | Admitting: Family Medicine

## 2014-11-03 VITALS — BP 156/86 | HR 87 | Temp 98.3°F | Wt 193.2 lb

## 2014-11-03 DIAGNOSIS — J069 Acute upper respiratory infection, unspecified: Secondary | ICD-10-CM

## 2014-11-03 MED ORDER — METHYLPREDNISOLONE ACETATE 80 MG/ML IJ SUSP
80.0000 mg | Freq: Once | INTRAMUSCULAR | Status: AC
  Administered 2014-11-03: 80 mg via INTRAMUSCULAR

## 2014-11-03 MED ORDER — AZITHROMYCIN 250 MG PO TABS: ORAL_TABLET | ORAL | Status: DC

## 2014-11-03 NOTE — Progress Notes (Signed)
   Subjective:    Patient ID: Christine MontaneJenny H [name], female    DOB: 08/11/1976, 38 y.o.   MRN: [id number]  HPI C/o uri sx's and cold sx's.  Review of Systems  Constitutional: Negative for fever.  HENT: Negative for ear pain.   Eyes: Negative for discharge.  Respiratory: Negative for cough.   Cardiovascular: Negative for chest pain.  Gastrointestinal: Negative for abdominal distention.  Endocrine: Negative for polyuria.  Genitourinary: Negative for difficulty urinating.  Musculoskeletal: Negative for gait problem and neck pain.  Skin: Negative for color change and rash.  Neurological: Negative for speech difficulty and headaches.  Psychiatric/Behavioral: Negative for agitation.       Objective:    BP 156/86 mmHg  Pulse 87  Temp(Src) 98.3 F (36.8 C) (Oral)  Wt 193 lb 3.2 oz (87.635 kg)  LMP 10/22/2014 Physical Exam  Constitutional: She is oriented to person, place, and time. She appears well-developed and well-nourished.  HENT:  Head: Normocephalic and atraumatic.  Mouth/Throat: Oropharynx is clear and moist.  Eyes: Pupils are equal, round, and reactive to light.  Neck: Normal range of motion. Neck supple.  Cardiovascular: Normal rate and regular rhythm.   No murmur heard. Pulmonary/Chest: Effort normal and breath sounds normal.  Abdominal: Soft. Bowel sounds are normal. There is no tenderness.  Neurological: She is alert and oriented to person, place, and time.  Skin: Skin is warm and dry.  Psychiatric: She has a normal mood and affect.          Assessment & Plan:     ICD-9-CM ICD-10-CM   1. URI (upper respiratory infection) 465.9 J06.9 azithromycin (ZITHROMAX) 250 MG tablet     methylPREDNISolone acetate (DEPO-MEDROL) injection 80 mg     No Follow-up on file.  Deatra CanterWilliam J Vylette Strubel FNP

## 2014-11-13 ENCOUNTER — Other Ambulatory Visit: Payer: Self-pay | Admitting: Nurse Practitioner

## 2014-11-13 ENCOUNTER — Other Ambulatory Visit: Payer: Self-pay | Admitting: *Deleted

## 2014-11-13 MED ORDER — CITALOPRAM HYDROBROMIDE 20 MG PO TABS: 20.0000 mg | ORAL_TABLET | Freq: Every day | ORAL | Status: DC

## 2014-11-24 DIAGNOSIS — N762 Acute vulvitis: Secondary | ICD-10-CM | POA: Insufficient documentation

## 2014-11-24 DIAGNOSIS — R928 Other abnormal and inconclusive findings on diagnostic imaging of breast: Secondary | ICD-10-CM | POA: Insufficient documentation

## 2014-11-27 ENCOUNTER — Telehealth: Payer: Self-pay | Admitting: Nurse Practitioner

## 2014-11-27 NOTE — Telephone Encounter (Signed)
Pt given the 4:30 appt on 1/13 with MMM.

## 2014-12-06 ENCOUNTER — Encounter: Payer: Self-pay | Admitting: Nurse Practitioner

## 2014-12-06 ENCOUNTER — Ambulatory Visit (INDEPENDENT_AMBULATORY_CARE_PROVIDER_SITE_OTHER): Payer: BLUE CROSS/BLUE SHIELD | Admitting: Nurse Practitioner

## 2014-12-06 VITALS — BP 156/97 | HR 70 | Temp 97.2°F | Ht 65.0 in | Wt 195.0 lb

## 2014-12-06 DIAGNOSIS — F411 Generalized anxiety disorder: Secondary | ICD-10-CM

## 2014-12-06 MED ORDER — CITALOPRAM HYDROBROMIDE 20 MG PO TABS: 20.0000 mg | ORAL_TABLET | Freq: Every day | ORAL | Status: DC

## 2014-12-06 NOTE — Progress Notes (Signed)
   Subjective:    Patient ID: Christine [name], female    DOB: 03/02/1976, 39 y.o.   MRN: [id number]  HPI Patient here today for follow up of GAD- she is currently on celexa 20 ng- working well- says that it keeps her daily anxiety to a minium. SHe denies any side effects.    Review of Systems  Constitutional: Negative.   HENT: Negative.   Respiratory: Negative.   Cardiovascular: Negative.   Gastrointestinal: Negative.   Genitourinary: Negative.   Neurological: Negative.   Psychiatric/Behavioral: Negative.   All other systems reviewed and are negative.      Objective:   Physical Exam  Constitutional: She is oriented to person, place, and time. She appears well-developed and well-nourished.  Cardiovascular: Normal rate and normal heart sounds.   Pulmonary/Chest: Effort normal and breath sounds normal.  Neurological: She is alert and oriented to person, place, and time.  Skin: Skin is warm and dry.  Psychiatric: She has a normal mood and affect. Her behavior is normal. Judgment and thought content normal.   BP 156/97 mmHg  Pulse 70  Temp(Src) 97.2 F (36.2 C) (Oral)  Ht 5\' 5"  (1.651 m)  Wt 195 lb (88.451 kg)  BMI 32.45 kg/m2        Assessment & Plan:   1. GAD (generalized anxiety disorder)    Meds ordered this encounter  Medications  . citalopram (CELEXA) 20 MG tablet    Sig: Take 1 tablet (20 mg total) by mouth daily.    Dispense:  30 tablet    Refill:  5    Order Specific Question:  Supervising Provider    Answer:  Ernestina PennaMOORE, DONALD W [[id number]]    Stress management Medication side effects discussed  Mary-Margaret Daphine DeutscherMartin, FNP

## 2014-12-06 NOTE — Patient Instructions (Signed)
Stress and Stress Management Stress is a normal reaction to life events. It is what you feel when life demands more than you are used to or more than you can handle. Some stress can be useful. For example, the stress reaction can help you catch the last bus of the day, study for a test, or meet a deadline at work. But stress that occurs too often or for too long can cause problems. It can affect your emotional health and interfere with relationships and normal daily activities. Too much stress can weaken your immune system and increase your risk for physical illness. If you already have a medical problem, stress can make it worse. CAUSES  All sorts of life events may cause stress. An event that causes stress for one person may not be stressful for another person. Major life events commonly cause stress. These may be positive or negative. Examples include losing your job, moving into a new home, getting married, having a baby, or losing a loved one. Less obvious life events may also cause stress, especially if they occur day after day or in combination. Examples include working long hours, driving in traffic, caring for children, being in debt, or being in a difficult relationship. SIGNS AND SYMPTOMS Stress may cause emotional symptoms including, the following:  Anxiety. This is feeling worried, afraid, on edge, overwhelmed, or out of control.  Anger. This is feeling irritated or impatient.  Depression. This is feeling sad, down, helpless, or guilty.  Difficulty focusing, remembering, or making decisions. Stress may cause physical symptoms, including the following:   Aches and pains. These may affect your head, neck, back, stomach, or other areas of your body.  Tight muscles or clenched jaw.  Low energy or trouble sleeping. Stress may cause unhealthy behaviors, including the following:   Eating to feel better (overeating) or skipping meals.  Sleeping too little, too much, or both.  Working  too much or putting off tasks (procrastination).  Smoking, drinking alcohol, or using drugs to feel better. DIAGNOSIS  Stress is diagnosed through an assessment by your health care provider. Your health care provider will ask questions about your symptoms and any stressful life events.Your health care provider will also ask about your medical history and may order blood tests or other tests. Certain medical conditions and medicine can cause physical symptoms similar to stress. Mental illness can cause emotional symptoms and unhealthy behaviors similar to stress. Your health care provider may refer you to a mental health professional for further evaluation.  TREATMENT  Stress management is the recommended treatment for stress.The goals of stress management are reducing stressful life events and coping with stress in healthy ways.  Techniques for reducing stressful life events include the following:  Stress identification. Self-monitor for stress and identify what causes stress for you. These skills may help you to avoid some stressful events.  Time management. Set your priorities, keep a calendar of events, and learn to say "no." These tools can help you avoid making too many commitments. Techniques for coping with stress include the following:  Rethinking the problem. Try to think realistically about stressful events rather than ignoring them or overreacting. Try to find the positives in a stressful situation rather than focusing on the negatives.  Exercise. Physical exercise can release both physical and emotional tension. The key is to find a form of exercise you enjoy and do it regularly.  Relaxation techniques. These relax the body and mind. Examples include yoga, meditation, tai chi, biofeedback, deep  breathing, progressive muscle relaxation, listening to music, being out in nature, journaling, and other hobbies. Again, the key is to find one or more that you enjoy and can do  regularly.  Healthy lifestyle. Eat a balanced diet, get plenty of sleep, and do not smoke. Avoid using alcohol or drugs to relax.  Strong support network. Spend time with family, friends, or other people you enjoy being around.Express your feelings and talk things over with someone you trust. Counseling or talktherapy with a mental health professional may be helpful if you are having difficulty managing stress on your own. Medicine is typically not recommended for the treatment of stress.Talk to your health care provider if you think you need medicine for symptoms of stress. HOME CARE INSTRUCTIONS  Keep all follow-up visits as directed by your health care provider.  Take all medicines as directed by your health care provider. SEEK MEDICAL CARE IF:  Your symptoms get worse or you start having new symptoms.  You feel overwhelmed by your problems and can no longer manage them on your own. SEEK IMMEDIATE MEDICAL CARE IF:  You feel like hurting yourself or someone else. Document Released: 05/06/2001 Document Revised: 03/27/2014 Document Reviewed: 07/05/2013 Exit[employer] Patient Information 2015 [employer]. This information is not intended to replace advice given to you by your health care provider. Make sure you discuss any questions you have with your health care provider.  

## 2015-06-06 ENCOUNTER — Other Ambulatory Visit: Payer: Self-pay | Admitting: Nurse Practitioner

## 2015-06-06 NOTE — Telephone Encounter (Signed)
no more refills without being seen  

## 2015-06-06 NOTE — Telephone Encounter (Signed)
Last seen 12/23/14 MMM

## 2015-06-09 ENCOUNTER — Other Ambulatory Visit: Payer: Self-pay | Admitting: Nurse Practitioner

## 2015-07-14 ENCOUNTER — Other Ambulatory Visit: Payer: Self-pay | Admitting: Nurse Practitioner

## 2015-07-16 ENCOUNTER — Telehealth: Payer: Self-pay | Admitting: Nurse Practitioner

## 2015-07-16 NOTE — Telephone Encounter (Signed)
Done earler this am

## 2015-07-16 NOTE — Telephone Encounter (Signed)
Last seen 12/13/14

## 2015-07-26 HISTORY — PX: BREAST BIOPSY: SHX20

## 2015-08-02 ENCOUNTER — Other Ambulatory Visit: Payer: Self-pay | Admitting: Obstetrics and Gynecology

## 2015-08-02 DIAGNOSIS — R928 Other abnormal and inconclusive findings on diagnostic imaging of breast: Secondary | ICD-10-CM

## 2015-08-03 ENCOUNTER — Ambulatory Visit
Admission: RE | Admit: 2015-08-03 | Discharge: 2015-08-03 | Disposition: A | Discharge status: Home / Self Care | Payer: BLUE CROSS/BLUE SHIELD | Source: Ambulatory Visit | Attending: Obstetrics and Gynecology | Admitting: Obstetrics and Gynecology

## 2015-08-03 ENCOUNTER — Other Ambulatory Visit: Payer: Self-pay | Admitting: Obstetrics and Gynecology

## 2015-08-03 ENCOUNTER — Other Ambulatory Visit: Payer: Self-pay

## 2015-08-03 DIAGNOSIS — R928 Other abnormal and inconclusive findings on diagnostic imaging of breast: Secondary | ICD-10-CM

## 2015-08-03 DIAGNOSIS — N6310 Unspecified lump in the right breast, unspecified quadrant: Secondary | ICD-10-CM

## 2015-08-08 ENCOUNTER — Ambulatory Visit
Admission: RE | Admit: 2015-08-08 | Discharge: 2015-08-08 | Disposition: A | Discharge status: Home / Self Care | Payer: BLUE CROSS/BLUE SHIELD | Source: Ambulatory Visit | Attending: Obstetrics and Gynecology | Admitting: Obstetrics and Gynecology

## 2015-08-08 ENCOUNTER — Other Ambulatory Visit: Payer: Self-pay | Admitting: Obstetrics and Gynecology

## 2015-08-08 DIAGNOSIS — N6310 Unspecified lump in the right breast, unspecified quadrant: Secondary | ICD-10-CM

## 2015-08-10 ENCOUNTER — Other Ambulatory Visit: Payer: BLUE CROSS/BLUE SHIELD

## 2015-08-15 ENCOUNTER — Other Ambulatory Visit: Payer: Self-pay | Admitting: Nurse Practitioner

## 2015-08-15 NOTE — Telephone Encounter (Signed)
no more refills without being seen  

## 2015-08-15 NOTE — Telephone Encounter (Signed)
lmovm that refill has been sent in but NTBS before next refill

## 2015-08-15 NOTE — Telephone Encounter (Signed)
Last seen 12/13/14

## 2015-08-16 NOTE — Telephone Encounter (Signed)
Is she talking about celexa- looks like it has already been done

## 2015-08-16 NOTE — Telephone Encounter (Signed)
Patient aware that medication was filled yesterday.

## 2015-09-06 ENCOUNTER — Ambulatory Visit (INDEPENDENT_AMBULATORY_CARE_PROVIDER_SITE_OTHER): Payer: BLUE CROSS/BLUE SHIELD | Admitting: Nurse Practitioner

## 2015-09-06 ENCOUNTER — Encounter: Payer: Self-pay | Admitting: Nurse Practitioner

## 2015-09-06 VITALS — BP 151/91 | HR 69 | Temp 97.3°F | Ht 65.0 in | Wt 197.0 lb

## 2015-09-06 DIAGNOSIS — Z6832 Body mass index (BMI) 32.0-32.9, adult: Secondary | ICD-10-CM

## 2015-09-06 DIAGNOSIS — Z91018 Allergy to other foods: Secondary | ICD-10-CM

## 2015-09-06 DIAGNOSIS — F411 Generalized anxiety disorder: Secondary | ICD-10-CM

## 2015-09-06 DIAGNOSIS — R5383 Other fatigue: Secondary | ICD-10-CM

## 2015-09-06 DIAGNOSIS — Z23 Encounter for immunization: Secondary | ICD-10-CM | POA: Diagnosis not present

## 2015-09-06 DIAGNOSIS — T7800XA Anaphylactic reaction due to unspecified food, initial encounter: Secondary | ICD-10-CM | POA: Insufficient documentation

## 2015-09-06 DIAGNOSIS — R030 Elevated blood-pressure reading, without diagnosis of hypertension: Secondary | ICD-10-CM | POA: Diagnosis not present

## 2015-09-06 MED ORDER — ALPRAZOLAM 0.25 MG PO TABS: 0.2500 mg | ORAL_TABLET | Freq: Two times a day (BID) | ORAL | Status: DC | PRN

## 2015-09-06 MED ORDER — CITALOPRAM HYDROBROMIDE 20 MG PO TABS: 20.0000 mg | ORAL_TABLET | Freq: Every day | ORAL | Status: DC

## 2015-09-06 NOTE — Progress Notes (Signed)
   Subjective:    Patient ID: Christine [name], female    DOB: April 24, 1976, 39 y.o.   MRN: [id number]  HPI  Patient here today for follow up of GAD- she is currently on celexa 20 ng- working well- says that it keeps her daily anxiety to a minium. SHe denies any side effects. She says she has occasional panic attacks and is stressed- would like to have xanax to take on as needed bases.  Thinks her b12 is low     Review of Systems  Constitutional: Negative.   HENT: Negative.   Respiratory: Negative.   Cardiovascular: Negative.   Gastrointestinal: Negative.   Genitourinary: Negative.   Neurological: Negative.   Psychiatric/Behavioral: Negative.   All other systems reviewed and are negative.      Objective:   Physical Exam  Constitutional: She is oriented to person, place, and time. She appears well-developed and well-nourished.  Cardiovascular: Normal rate and normal heart sounds.   Pulmonary/Chest: Effort normal and breath sounds normal.  Neurological: She is alert and oriented to person, place, and time.  Skin: Skin is warm and dry.  Psychiatric: She has a normal mood and affect. Her behavior is normal. Judgment and thought content normal.    BP 151/91 mmHg  Pulse 69  Temp(Src) 97.3 F (36.3 C) (Oral)  Ht $R'5\' 5"'Wj$  (1.651 m)  Wt 197 lb (89.359 kg)  BMI 32.78 kg/m2        Assessment & Plan:   1. Allergy to meat Avoid redmeat and pork Keep epipen with you at all times  2. White coat syndrome with high blood pressure without hypertension Avoid salt Check blood pressure at home  3. Generalized anxiety disorder Stress management - ALPRAZolam (XANAX) 0.25 MG tablet; Take 1 tablet (0.25 mg total) by mouth 2 (two) times daily as needed for anxiety.  Dispense: 20 tablet; Refill: 0 - citalopram (CELEXA) 20 MG tablet; Take 1 tablet (20 mg total) by mouth daily.  Dispense: 30 tablet; Refill: 5  4. Other fatigue Labs pending - CMP14+EGFR - Anemia Profile B - Thyroid Panel  With TSH  5. BMI 32.0-32.9,adult Discussed diet and exercise for person with BMI >25 Will recheck weight in 3-6 months     Labs pending Health maintenance reviewed Diet and exercise encouraged Continue all meds Follow up  In 6 months and prn   [name], FNP

## 2015-09-06 NOTE — Patient Instructions (Signed)
Fatigue  Fatigue is feeling tired all of the time, a lack of energy, or a lack of motivation. Occasional or mild fatigue is often a normal response to activity or life in general. However, long-lasting (chronic) or extreme fatigue may indicate an underlying medical condition.  HOME CARE INSTRUCTIONS   Watch your fatigue for any changes. The following actions may help to lessen any discomfort you are feeling:  · Talk to your health care provider about how much sleep you need each night. Try to get the required amount every night.  · Take medicines only as directed by your health care provider.  · Eat a healthy and nutritious diet. Ask your health care provider if you need help changing your diet.  · Drink enough fluid to keep your urine clear or pale yellow.  · Practice ways of relaxing, such as yoga, meditation, massage therapy, or acupuncture.  · Exercise regularly.    · Change situations that cause you stress. Try to keep your work and personal routine reasonable.  · Do not abuse illegal drugs.  · Limit alcohol intake to no more than 1 drink per day for nonpregnant women and 2 drinks per day for men. One drink equals 12 ounces of beer, 5 ounces of wine, or 1½ ounces of hard liquor.  · Take a multivitamin, if directed by your health care provider.  SEEK MEDICAL CARE IF:   · Your fatigue does not get better.  · You have a fever.    · You have unintentional weight loss or gain.  · You have headaches.    · You have difficulty:      Falling asleep.    Sleeping throughout the night.  · You feel angry, guilty, anxious, or sad.     · You are unable to have a bowel movement (constipation).    · You skin is dry.     · Your legs or another part of your body is swollen.    SEEK IMMEDIATE MEDICAL CARE IF:   · You feel confused.    · Your vision is blurry.  · You feel faint or pass out.    · You have a severe headache.    · You have severe abdominal, pelvic, or back pain.    · You have chest pain, shortness of breath, or an  irregular or fast heartbeat.    · You are unable to urinate or you urinate less than normal.    · You develop abnormal bleeding, such as bleeding from the rectum, vagina, nose, lungs, or nipples.  · You vomit blood.     · You have thoughts about harming yourself or committing suicide.    · You are worried that you might harm someone else.       This information is not intended to replace advice given to you by your health care provider. Make sure you discuss any questions you have with your health care provider.     Document Released: 09/07/2007 Document Revised: 12/01/2014 Document Reviewed: 03/14/2014  Elsevier Interactive Patient Education ©2016 [employer]

## 2015-09-07 LAB — ANEMIA PROFILE B
BASOS ABS: 0.1 10*3/uL (ref 0.0–0.2)
Basos: 1 %
EOS (ABSOLUTE): 0.3 10*3/uL (ref 0.0–0.4)
Eos: 3 %
Ferritin: 69 ng/mL (ref 15–150)
Folate: 4.1 ng/mL (ref >3.0)
HEMATOCRIT: 40.7 % (ref 34.0–46.6)
Hemoglobin: 13.8 g/dL (ref 11.1–15.9)
IMMATURE GRANS (ABS): 0 10*3/uL (ref 0.0–0.1)
IMMATURE GRANULOCYTES: 0 %
IRON: 100 ug/dL (ref 27–159)
Iron Saturation: 28 % (ref 15–55)
Lymphocytes Absolute: 3.2 10*3/uL — ABNORMAL HIGH (ref 0.7–3.1)
Lymphs: 34 %
MCH: 30.9 pg (ref 26.6–33.0)
MCHC: 33.9 g/dL (ref 31.5–35.7)
MCV: 91 fL (ref 79–97)
MONOCYTES: 6 %
Monocytes Absolute: 0.6 10*3/uL (ref 0.1–0.9)
NEUTROS ABS: 5.3 10*3/uL (ref 1.4–7.0)
Neutrophils: 56 %
PLATELETS: 284 10*3/uL (ref 150–379)
RBC: 4.47 x10E6/uL (ref 3.77–5.28)
RDW: 13.6 % (ref 12.3–15.4)
RETIC CT PCT: 3 % — AB (ref 0.6–2.6)
TIBC: 353 ug/dL (ref 250–450)
UIBC: 253 ug/dL (ref 131–425)
VITAMIN B 12: 352 pg/mL (ref 211–946)
WBC: 9.5 10*3/uL (ref 3.4–10.8)

## 2015-09-07 LAB — CMP14+EGFR
A/G RATIO: 1.4 (ref 1.1–2.5)
ALBUMIN: 4.2 g/dL (ref 3.5–5.5)
ALT: 21 IU/L (ref 0–32)
AST: 20 IU/L (ref 0–40)
Alkaline Phosphatase: 108 IU/L (ref 39–117)
BUN/Creatinine Ratio: 12 (ref 9–23)
BUN: 8 mg/dL (ref 6–24)
Bilirubin Total: 0.4 mg/dL (ref 0.0–1.2)
CALCIUM: 9.5 mg/dL (ref 8.7–10.2)
CHLORIDE: 101 mmol/L (ref 97–108)
CO2: 22 mmol/L (ref 18–29)
CREATININE: 0.67 mg/dL (ref 0.57–1.00)
GFR, EST AFRICAN AMERICAN: 126 mL/min/{1.73_m2} (ref >59)
GFR, EST NON AFRICAN AMERICAN: 110 mL/min/{1.73_m2} (ref >59)
GLOBULIN, TOTAL: 2.9 g/dL (ref 1.5–4.5)
Glucose: 78 mg/dL (ref 65–99)
Potassium: 4.6 mmol/L (ref 3.5–5.2)
SODIUM: 140 mmol/L (ref 134–144)
TOTAL PROTEIN: 7.1 g/dL (ref 6.0–8.5)

## 2015-09-07 LAB — THYROID PANEL WITH TSH
Free Thyroxine Index: 1.6 (ref 1.2–4.9)
T3 UPTAKE RATIO: 18 % — AB (ref 24–39)
T4 TOTAL: 9.1 ug/dL (ref 4.5–12.0)
TSH: 1.39 u[IU]/mL (ref 0.450–4.500)

## 2015-09-10 ENCOUNTER — Ambulatory Visit: Payer: BLUE CROSS/BLUE SHIELD | Admitting: Nurse Practitioner

## 2015-11-11 ENCOUNTER — Other Ambulatory Visit: Payer: Self-pay | Admitting: Allergy and Immunology

## 2015-11-15 ENCOUNTER — Other Ambulatory Visit: Payer: Self-pay | Admitting: Allergy and Immunology

## 2016-02-11 ENCOUNTER — Other Ambulatory Visit: Payer: Self-pay | Admitting: Allergy

## 2016-02-11 MED ORDER — MONTELUKAST SODIUM 10 MG PO TABS: ORAL_TABLET | ORAL | Status: DC

## 2016-02-13 ENCOUNTER — Other Ambulatory Visit: Payer: Self-pay

## 2016-02-13 ENCOUNTER — Telehealth: Payer: Self-pay | Admitting: Allergy and Immunology

## 2016-02-13 MED ORDER — MONTELUKAST SODIUM 10 MG PO TABS: ORAL_TABLET | ORAL | Status: DC

## 2016-02-13 NOTE — Telephone Encounter (Signed)
Sent in refill of montelukast  

## 2016-02-13 NOTE — Telephone Encounter (Signed)
Pt called and made appointment for Monday march 27 with dr Nunzio CobbsBobbitt  And wanted to see if we would call in [place].

## 2016-02-18 ENCOUNTER — Encounter: Payer: Self-pay | Admitting: Allergy and Immunology

## 2016-02-18 ENCOUNTER — Ambulatory Visit (INDEPENDENT_AMBULATORY_CARE_PROVIDER_SITE_OTHER): Payer: BLUE CROSS/BLUE SHIELD | Admitting: Allergy and Immunology

## 2016-02-18 VITALS — BP 120/85 | HR 70 | Temp 97.5°F | Resp 16

## 2016-02-18 DIAGNOSIS — Z91018 Allergy to other foods: Secondary | ICD-10-CM | POA: Diagnosis not present

## 2016-02-18 DIAGNOSIS — J3089 Other allergic rhinitis: Secondary | ICD-10-CM

## 2016-02-18 DIAGNOSIS — J0100 Acute maxillary sinusitis, unspecified: Secondary | ICD-10-CM

## 2016-02-18 DIAGNOSIS — J45901 Unspecified asthma with (acute) exacerbation: Secondary | ICD-10-CM | POA: Diagnosis not present

## 2016-02-18 DIAGNOSIS — J019 Acute sinusitis, unspecified: Secondary | ICD-10-CM | POA: Insufficient documentation

## 2016-02-18 MED ORDER — BECLOMETHASONE DIPROPIONATE 40 MCG/ACT IN AERS: 2.0000 | INHALATION_SPRAY | Freq: Two times a day (BID) | RESPIRATORY_TRACT | Status: DC

## 2016-02-18 MED ORDER — MONTELUKAST SODIUM 10 MG PO TABS
ORAL_TABLET | ORAL | Status: DC
Start: 2016-02-18 — End: 2016-03-15

## 2016-02-18 MED ORDER — FLUTICASONE PROPIONATE 50 MCG/ACT NA SUSP: 1.0000 | Freq: Every day | NASAL | Status: DC

## 2016-02-18 MED ORDER — ALBUTEROL SULFATE HFA 108 (90 BASE) MCG/ACT IN AERS: 2.0000 | INHALATION_SPRAY | RESPIRATORY_TRACT | Status: DC | PRN

## 2016-02-18 NOTE — Patient Instructions (Addendum)
Acute sinusitis  Prednisone has been provided, 20 mg x 4 days, 10 mg x1 day, then stop.  Nasal saline lavage (NeilMed) as needed has been recommended along with instructions for proper administration.  Guaifenesin 1200 mg twice daily as needed with adequate hydration as discussed.   Fluticasone nasal spray, one spray per nostril twice a day when necessary.  The patient has been asked to contact me if her symptoms persist, progress, or if she becomes febrile. Otherwise, she may return for follow up in 6 months.  Mild persistent asthma with mild exacerbation  Prednisone has been provided (as above).  Restart montelukast 10 mg daily at bedtime.  A refill prescription has been provided.  For now, and during all respiratory tract infection or asthma flares, add Qvar 40 g, 2 inhalations via spacer device twice a day until symptoms have returned baseline.  A sample and prescription have been provided.  Subjective and objective measures of pulmonary function will be followed and the treatment plan will be adjusted accordingly.  Allergy to meat Galactose-alpha-1,3-galactose hypersensitivity.  Continue meticulous avoidance of mammalian meat and have access to epinephrine autoinjector 2 pack in case of accidental ingestion.  We will recheck alpha gal levels in 6-12 months.  Allergic rhinitis  Continue appropriate allergen avoidance measures, montelukast 10 mg daily at bedtime, and fluticasone nasal spray as needed.    Return in about 6 months (around 08/20/2016), or if symptoms worsen or fail to improve.

## 2016-02-18 NOTE — Assessment & Plan Note (Signed)
   Prednisone has been provided, 20 mg x 4 days, 10 mg x1 day, then stop.  Nasal saline lavage (NeilMed) as needed has been recommended along with instructions for proper administration.  Guaifenesin 1200 mg twice daily as needed with adequate hydration as discussed.   Fluticasone nasal spray, one spray per nostril twice a day when necessary.  The patient has been asked to contact me if her symptoms persist, progress, or if she becomes febrile. Otherwise, she may return for follow up in 6 months.

## 2016-02-18 NOTE — Assessment & Plan Note (Addendum)
   Prednisone has been provided (as above).  Restart montelukast 10 mg daily at bedtime.  A refill prescription has been provided.  For now, and during all respiratory tract infection or asthma flares, add Qvar 40 g, 2 inhalations via spacer device twice a day until symptoms have returned baseline.  A sample and prescription have been provided.  Subjective and objective measures of pulmonary function will be followed and the treatment plan will be adjusted accordingly.

## 2016-02-18 NOTE — Assessment & Plan Note (Signed)
   Continue appropriate allergen avoidance measures, montelukast 10 mg daily at bedtime, and fluticasone nasal spray as needed. 

## 2016-02-18 NOTE — Progress Notes (Signed)
Follow-up Note  RE: Christine [name] MRN: [id number] DOB: 03/07/1976 Date of Office Visit: 02/18/2016  Primary care provider: Bennie PieriniMARTIN,MARY MARGARET, FNP Referring provider: Bennie PieriniMartin, Mary-Margaret, FNP  History of present illness: HPI Comments: Christine PhilipsJenny [name] is a 40 y.o. female with asthma, allergic rhinitis, and galactose-alpha-1,3-galactose hypersensitivity who presents today for follow up.  She was last seen by me on 05/22/2015.  She reports that she ran out of montelukast last week and since that time has developed some chest tightness, dyspnea, and coughing.  In addition, over the past week or so she has experienced nasal congestion, thick postnasal drainage, and sinus pressure/pain behind the eyes and over the cheekbones.  She denies fevers, chills, or discolored mucus production.  She has been meticulously avoiding nonprimate mammalian meat and has access to epinephrine autoinjector in case of accidental ingestion.  She has had no additional reactions since avoiding beef, pork, lamb, and venison.   Assessment and plan: Acute sinusitis  Prednisone has been provided, 20 mg x 4 days, 10 mg x1 day, then stop.  Nasal saline lavage (NeilMed) as needed has been recommended along with instructions for proper administration.  Guaifenesin 1200 mg twice daily as needed with adequate hydration as discussed.   Fluticasone nasal spray, one spray per nostril twice a day when necessary.  The patient has been asked to contact me if her symptoms persist, progress, or if she becomes febrile. Otherwise, she may return for follow up in 6 months.  Mild persistent asthma with mild exacerbation  Prednisone has been provided (as above).  Restart montelukast 10 mg daily at bedtime.  A refill prescription has been provided.  For now, and during all respiratory tract infection or asthma flares, add Qvar 40 g, 2 inhalations via spacer device twice a day until symptoms have returned baseline.  A sample and  prescription have been provided.  Subjective and objective measures of pulmonary function will be followed and the treatment plan will be adjusted accordingly.  Allergy to meat Galactose-alpha-1,3-galactose hypersensitivity.  Continue meticulous avoidance of mammalian meat and have access to epinephrine autoinjector 2 pack in case of accidental ingestion.  We will recheck alpha gal levels in 6-12 months.  Allergic rhinitis  Continue appropriate allergen avoidance measures, montelukast 10 mg daily at bedtime, and fluticasone nasal spray as needed.    Meds ordered this encounter  Medications  . beclomethasone (QVAR) 40 MCG/ACT inhaler    Sig: Inhale 2 puffs into the lungs 2 (two) times daily.    Dispense:  1 Inhaler    Refill:  5  . montelukast (SINGULAIR) 10 MG tablet    Sig: TAKE ONE TABLET BY MOUTH ONCE DAILY AS DIRECTED    Dispense:  30 tablet    Refill:  0    Patient needs OV for further refills  . fluticasone (FLONASE) 50 MCG/ACT nasal spray    Sig: Place 1 spray into both nostrils daily.    Dispense:  16 g    Refill:  5  . albuterol (PROAIR HFA) 108 (90 Base) MCG/ACT inhaler    Sig: Inhale 2 puffs into the lungs every 4 (four) hours as needed for wheezing or shortness of breath.    Dispense:  1 Inhaler    Refill:  3    Diagnositics: Spirometry revealed an FVC of 3.07 L and an FEV1 of 2.47 L (86% predicted) without significant postbronchodilator improvement.  Please see scanned spirometry results for details.    Physical examination: Blood pressure 120/85, pulse 70,  temperature 97.5 F (36.4 C), temperature source Oral, resp. rate 16.  General: Alert, interactive, in no acute distress. HEENT: TMs pearly gray, turbinates edematous with thick discharge, post-pharynx erythematous. Neck: Supple without lymphadenopathy. Lungs: Mildly decreased breath sounds bilaterally without wheezing, rhonchi or rales. CV: Normal S1, S2 without murmurs. Skin: Warm and dry, without  lesions or rashes.  The following portions of the patient's history were reviewed and updated as appropriate: allergies, current medications, past family history, past medical history, past social history, past surgical history and problem list.    Medication List       This list is accurate as of: 02/18/16  7:46 PM.  Always use your most recent med list.               ALPRAZolam 0.25 MG tablet  Commonly known as:  XANAX  Take 1 tablet (0.25 mg total) by mouth 2 (two) times daily as needed for anxiety.     beclomethasone 40 MCG/ACT inhaler  Commonly known as:  QVAR  Inhale 2 puffs into the lungs 2 (two) times daily.     beclomethasone 40 MCG/ACT inhaler  Commonly known as:  QVAR  Inhale 2 puffs into the lungs 2 (two) times daily.     citalopram 20 MG tablet  Commonly known as:  CELEXA  Take 1 tablet (20 mg total) by mouth daily.     EPINEPHrine 0.3 mg/0.3 mL Soaj injection  Commonly known as:  EPI-PEN  Inject 0.3 mLs (0.3 mg total) into the muscle once.     fluticasone 50 MCG/ACT nasal spray  Commonly known as:  FLONASE  Place 1 spray into both nostrils daily.     [facility] 1/35 1-35 MG-MCG tablet  Generic drug:  ethynodiol-ethinyl estradiol  Take 1 tablet by mouth daily.     montelukast 10 MG tablet  Commonly known as:  SINGULAIR  TAKE ONE TABLET BY MOUTH ONCE DAILY AS DIRECTED     PROAIR HFA 108 (90 Base) MCG/ACT inhaler  Generic drug:  albuterol  Inhale 2 puffs into the lungs every 4 (four) hours as needed for wheezing or shortness of breath.     albuterol 108 (90 Base) MCG/ACT inhaler  Commonly known as:  PROAIR HFA  Inhale 2 puffs into the lungs every 4 (four) hours as needed for wheezing or shortness of breath.        Allergies  Allergen Reactions  . Sulfa Antibiotics Hives  . Doxycycline Hyclate   . Ivp Dye [Iodinated Diagnostic Agents] Hives  . Paxil [Paroxetine Hcl] Other (See Comments)    Made pt very angry   . Phosphorus Diarrhea   Review of  systems: Constitutional: Negative for fever, chills and weight loss.  HENT: Negative for nosebleeds.   Positive for nasal congestion, postnasal drainage, and sinus pressure/pain. Eyes: Negative for blurred vision.  Respiratory: Negative for hemoptysis.   Positive for dyspnea, coughing. Cardiovascular: Negative for chest pain.  Gastrointestinal: Negative for diarrhea and constipation.  Genitourinary: Negative for dysuria.  Musculoskeletal: Negative for myalgias and joint pain.  Neurological: Negative for dizziness.  Endo/Heme/Allergies: Does not bruise/bleed easily.   Past Medical History  Diagnosis Date  . Asthma   . Vitamin D deficiency   . GAD (generalized anxiety disorder)     Family History  Problem Relation Age of Onset  . Diabetes Mother   . Hypertension Mother   . Hyperlipidemia Father   . Hypertension Father     Social History   Social History  .  Marital Status: Married    Spouse Name: N/A  . Number of Children: N/A  . Years of Education: N/A   Occupational History  . Not on file.   Social History Main Topics  . Smoking status: Never Smoker   . Smokeless tobacco: Not on file  . Alcohol Use: No  . Drug Use: No  . Sexual Activity: Not on file   Other Topics Concern  . Not on file   Social History Narrative     I appreciate the opportunity to take part in this Karole's care. Please do not hesitate to contact me with questions.  Sincerely,   R. Jorene Guest, MD

## 2016-02-18 NOTE — Assessment & Plan Note (Signed)
Galactose-alpha-1,3-galactose hypersensitivity.  Continue meticulous avoidance of mammalian meat and have access to epinephrine autoinjector 2 pack in case of accidental ingestion.  We will recheck alpha gal levels in 6-12 months.

## 2016-02-29 ENCOUNTER — Other Ambulatory Visit: Payer: Self-pay

## 2016-02-29 DIAGNOSIS — J45901 Unspecified asthma with (acute) exacerbation: Secondary | ICD-10-CM

## 2016-02-29 MED ORDER — BECLOMETHASONE DIPROPIONATE 40 MCG/ACT IN AERS: 2.0000 | INHALATION_SPRAY | Freq: Two times a day (BID) | RESPIRATORY_TRACT | Status: DC

## 2016-02-29 MED ORDER — MOMETASONE FUROATE 100 MCG/ACT IN AERO: 2.0000 | INHALATION_SPRAY | Freq: Two times a day (BID) | RESPIRATORY_TRACT | Status: DC

## 2016-02-29 NOTE — Telephone Encounter (Signed)
Called patient to advise change to Asmanex per Dr. Nunzio CobbsBobbitt.  Patient [facility].  She requested I send new rx for QVAR to [facility] and will call me Monday to advise status.  Advised it was an insurance issue and probably not pharmacy but would try.

## 2016-02-29 NOTE — Telephone Encounter (Signed)
[employer] denied original rx for QVAR and had to try Asmanex and Flovent.  Asmanex rx sent to pharmacy.

## 2016-03-14 ENCOUNTER — Other Ambulatory Visit: Payer: Self-pay | Admitting: Nurse Practitioner

## 2016-03-15 ENCOUNTER — Other Ambulatory Visit: Payer: Self-pay | Admitting: Allergy and Immunology

## 2016-03-28 ENCOUNTER — Encounter (INDEPENDENT_AMBULATORY_CARE_PROVIDER_SITE_OTHER): Payer: Self-pay

## 2016-03-28 ENCOUNTER — Encounter: Payer: Self-pay | Admitting: Family Medicine

## 2016-03-28 ENCOUNTER — Ambulatory Visit (INDEPENDENT_AMBULATORY_CARE_PROVIDER_SITE_OTHER): Payer: BLUE CROSS/BLUE SHIELD | Admitting: Family Medicine

## 2016-03-28 VITALS — BP 146/92 | HR 90 | Temp 97.3°F | Ht 65.0 in | Wt 192.4 lb

## 2016-03-28 DIAGNOSIS — R3129 Other microscopic hematuria: Secondary | ICD-10-CM

## 2016-03-28 DIAGNOSIS — R1030 Lower abdominal pain, unspecified: Secondary | ICD-10-CM

## 2016-03-28 LAB — URINALYSIS, COMPLETE
GLUCOSE, UA: NEGATIVE
LEUKOCYTES UA: NEGATIVE
Nitrite, UA: NEGATIVE
SPEC GRAV UA: 1.02 (ref 1.005–1.030)
Urobilinogen, Ur: 0.2 mg/dL (ref 0.2–1.0)
pH, UA: 7 (ref 5.0–7.5)

## 2016-03-28 LAB — MICROSCOPIC EXAMINATION: Epithelial Cells (non renal): >10 /hpf — AB (ref 0–10)

## 2016-03-28 LAB — PREGNANCY, URINE: Preg Test, Ur: NEGATIVE

## 2016-03-28 MED ORDER — DICYCLOMINE HCL 10 MG PO CAPS: 10.0000 mg | ORAL_CAPSULE | Freq: Three times a day (TID) | ORAL | Status: DC

## 2016-03-28 NOTE — Patient Instructions (Signed)
Great to meet you!  I think it is most likely you have IBS.   The small amount of blood in your urine is likely not important and we can go looking at another urine sample in 1 month  Come back for follow up with Lamount CrankerMary Margret or myself in 1 month

## 2016-03-28 NOTE — Progress Notes (Signed)
   HPI  Patient presents today here with suprapubic pressure.  Patient's lines over the last 5 days she's had dull achy annoying type suprapubic pressure pain.  She denies any other obvious associated symptoms. She denies dysuria, other abdominal pain, vaginal discharge or itching. She's never had similar pain. No nausea, vomiting, or chills. She has anxiety and is worried about cancer.  This week she's had increased episodes of loose stools. She struggles at baseline with constipation, over 40% of her stools are constipated. She does have some incomplete improvement of the pain after defecation. When She gets nervous she does have increased loose stools and diarrhea.  She's had UTIs previously which were much more obvious than this.  Has mildly increased urinary frequency and mild midline low back pain.  The pain is similar to before she starts. However it's not time and she has not started.  PMH: Smoking status noted ROS: Per HPI  Objective: BP 146/92 mmHg  Pulse 90  Temp(Src) 97.3 F (36.3 C) (Oral)  Ht 5\' 5"  (1.651 m)  Wt 192 lb 6.4 oz (87.272 kg)  BMI 32.02 kg/m2  LMP 03/12/2016 Gen: NAD, alert, cooperative with exam HEENT: NCAT CV: RRR, good S1/S2, no murmur Resp: CTABL, no wheezes, non-labored Abd: Soft, mild tenderness to palpation of right lower quadrant/suprapubic area, no guarding, no rebound, positive bowel sounds Ext: No edema, warm Neuro: Alert and oriented, No gross deficits  Assessment and plan:  # Per pubic pressure Likely irritable bowel syndrome with resolution last improvement after defecation Trial of Bentyl No obvious signs of UTI on urinalysis, however she does have microscopic hematuria, I have sent a culture Repeat urinalysis next month Follow-up in one month with myself or PCP    Orders Placed This Encounter  Procedures  . Urine culture  . Urinalysis, Complete  . Pregnancy, urine    Meds ordered this encounter  Medications  .  dicyclomine (BENTYL) 10 MG capsule    Sig: Take 1 capsule (10 mg total) by mouth 3 (three) times daily before meals.    Dispense:  90 capsule    Refill:  0    Murtis SinkSam Jedaiah Rathbun, MD [facility] 03/28/2016, 12:39 PM

## 2016-03-30 LAB — URINE CULTURE

## 2016-03-31 ENCOUNTER — Other Ambulatory Visit: Payer: Self-pay | Admitting: Nurse Practitioner

## 2016-03-31 ENCOUNTER — Encounter: Payer: Self-pay | Admitting: Family Medicine

## 2016-03-31 ENCOUNTER — Other Ambulatory Visit: Payer: Self-pay | Admitting: Family Medicine

## 2016-03-31 DIAGNOSIS — F411 Generalized anxiety disorder: Secondary | ICD-10-CM

## 2016-03-31 MED ORDER — CIPROFLOXACIN HCL 250 MG PO TABS: 250.0000 mg | ORAL_TABLET | Freq: Two times a day (BID) | ORAL | Status: DC

## 2016-03-31 MED ORDER — ALPRAZOLAM 0.25 MG PO TABS: 0.2500 mg | ORAL_TABLET | Freq: Two times a day (BID) | ORAL | Status: DC | PRN

## 2016-03-31 NOTE — Addendum Note (Signed)
Addended by: Jannifer RodneyHAWKS, Avie Checo A on: 03/31/2016 10:00 AM   Modules accepted: Orders

## 2016-04-02 ENCOUNTER — Encounter: Payer: Self-pay | Admitting: Nurse Practitioner

## 2016-04-15 ENCOUNTER — Telehealth: Payer: Self-pay | Admitting: Family Medicine

## 2016-04-15 ENCOUNTER — Encounter: Payer: Self-pay | Admitting: Family Medicine

## 2016-04-15 ENCOUNTER — Telehealth: Payer: Self-pay

## 2016-04-15 DIAGNOSIS — R195 Other fecal abnormalities: Secondary | ICD-10-CM

## 2016-04-15 NOTE — Telephone Encounter (Signed)
My exam previously she had symptoms more consistent with IBS than any other explanation. I think GI referral is reasonable.  Referral placed.  Murtis SinkSam Mccayla Shimada, MD [facility] 04/15/2016, 12:38 PM

## 2016-04-15 NOTE — Telephone Encounter (Signed)
Spoke with pt and she states she recently went to her GYN and they did a pelvic ultrasound as well as blood work and all was normal. They agreed that it was most likely IBS and advised her to call us so we could do a referral for GI. She would like to see Dr.Gates, Laural BenesJohnson or Sharl MaKerr at [facility] off of [address]. Is this ok?

## 2016-04-15 NOTE — Telephone Encounter (Signed)
Wants a referral to GI

## 2016-04-15 NOTE — Telephone Encounter (Signed)
Pt aware.

## 2016-04-16 ENCOUNTER — Other Ambulatory Visit: Payer: Self-pay | Admitting: Family

## 2016-04-16 DIAGNOSIS — F411 Generalized anxiety disorder: Secondary | ICD-10-CM

## 2016-04-16 MED ORDER — ALPRAZOLAM 0.25 MG PO TABS: 0.2500 mg | ORAL_TABLET | Freq: Two times a day (BID) | ORAL | Status: DC | PRN

## 2016-04-16 NOTE — Telephone Encounter (Signed)
[facility] 

## 2016-04-16 NOTE — Telephone Encounter (Signed)
Please call in xanax 0.25 1 po qd prn #[phone]  with 0 refills

## 2016-04-17 ENCOUNTER — Other Ambulatory Visit: Payer: Self-pay | Admitting: Nurse Practitioner

## 2016-04-18 ENCOUNTER — Other Ambulatory Visit: Payer: Self-pay | Admitting: Nurse Practitioner

## 2016-05-06 ENCOUNTER — Other Ambulatory Visit: Payer: Self-pay | Admitting: *Deleted

## 2016-05-06 MED ORDER — DICYCLOMINE HCL 10 MG PO CAPS: 10.0000 mg | ORAL_CAPSULE | Freq: Three times a day (TID) | ORAL | Status: DC

## 2016-05-06 NOTE — Telephone Encounter (Signed)
Patient is requesting a 90 day supply #270.

## 2016-05-14 ENCOUNTER — Other Ambulatory Visit: Payer: Self-pay | Admitting: Nurse Practitioner

## 2016-07-01 ENCOUNTER — Encounter: Payer: Self-pay | Admitting: Nurse Practitioner

## 2016-07-01 ENCOUNTER — Ambulatory Visit (INDEPENDENT_AMBULATORY_CARE_PROVIDER_SITE_OTHER): Payer: 59 | Admitting: Nurse Practitioner

## 2016-07-01 VITALS — BP 136/82 | HR 73 | Temp 97.0°F | Ht 65.0 in | Wt 185.0 lb

## 2016-07-01 DIAGNOSIS — G4700 Insomnia, unspecified: Secondary | ICD-10-CM | POA: Diagnosis not present

## 2016-07-01 DIAGNOSIS — F411 Generalized anxiety disorder: Secondary | ICD-10-CM | POA: Diagnosis not present

## 2016-07-01 MED ORDER — CITALOPRAM HYDROBROMIDE 40 MG PO TABS: 40.0000 mg | ORAL_TABLET | Freq: Every day | ORAL | 5 refills | Status: DC

## 2016-07-01 MED ORDER — ZOLPIDEM TARTRATE 5 MG PO TABS: 5.0000 mg | ORAL_TABLET | Freq: Every evening | ORAL | 1 refills | Status: DC | PRN

## 2016-07-01 NOTE — Progress Notes (Signed)
   Subjective:    Patient ID: Christine MontaneJenny H [name], female    DOB: 10/17/1976, 40 y.o.   MRN: [id number]  HPI Patient in today to discuss anxiety. Started Last Wednesday- no new events in her life so not sure what triggered it. SHe says she is having episodes where she feels pnickally and waking up feeling like pens and needles all over. Slight SOB during attacks. Seems to be worse in the mornings and gets better throughout day. Dreads goiun to bed because she knows she is going to not be able to sleep. She is currently celexa 20mg  a day and xanax 0.25 that she only takes as needed. GAD 7 : Generalized Anxiety Score 07/01/2016  Nervous, Anxious, on Edge 3  Control/stop worrying 3  Worry too much - different things 3  Trouble relaxing 2  Restless 0  Easily annoyed or irritable 2  Afraid - awful might happen 3  Total GAD 7 Score 16  Anxiety Difficulty Very difficult    Depression screen [facility] 2/9 07/01/2016 03/28/2016 09/06/2015 12/06/2014  Decreased Interest 0 0 0 0  Down, Depressed, Hopeless 0 0 0 0  PHQ - 2 Score 0 0 0 0       Review of Systems  Constitutional: Negative.   HENT: Negative.   Respiratory: Negative.   Cardiovascular: Negative.   Genitourinary: Negative.   Neurological: Negative.   Psychiatric/Behavioral: Negative.   All other systems reviewed and are negative.      Objective:   Physical Exam  Constitutional: She is oriented to person, place, and time. She appears well-developed and well-nourished. No distress.  Cardiovascular: Normal rate, regular rhythm and normal heart sounds.   Pulmonary/Chest: Effort normal and breath sounds normal.  Abdominal: Soft.  Neurological: She is alert and oriented to person, place, and time.  Skin: Skin is warm and dry.  Psychiatric: She has a normal mood and affect. Her behavior is normal. Judgment and thought content normal.  Good eye contact  Answers all questions approrpiately   BP 136/82 (BP Location: Left Arm, Cuff Size: Normal)    Pulse 73   Temp 97 F (36.1 C) (Oral)   Ht 5\' 5"  (1.651 m)   Wt 185 lb (83.9 kg)   BMI 30.79 kg/m         Assessment & Plan:  1. Insomnia Bedtime routine - zolpidem (AMBIEN) 5 MG tablet; Take 1 tablet (5 mg total) by mouth at bedtime as needed for sleep.  Dispense: 15 tablet; Refill: 1  2. GAD (generalized anxiety disorder) Stress management See counselor - citalopram (CELEXA) 40 MG tablet; Take 1 tablet (40 mg total) by mouth daily.  Dispense: 30 tablet; Refill: 5  Mary-Margaret Daphine DeutscherMartin, FNP

## 2016-07-01 NOTE — Patient Instructions (Signed)
Stress and Stress Management Stress is a normal reaction to life events. It is what you feel when life demands more than you are used to or more than you can handle. Some stress can be useful. For example, the stress reaction can help you catch the last bus of the day, study for a test, or meet a deadline at work. But stress that occurs too often or for too long can cause problems. It can affect your emotional health and interfere with relationships and normal daily activities. Too much stress can weaken your immune system and increase your risk for physical illness. If you already have a medical problem, stress can make it worse. CAUSES  All sorts of life events may cause stress. An event that causes stress for one person may not be stressful for another person. Major life events commonly cause stress. These may be positive or negative. Examples include losing your job, moving into a new home, getting married, having a baby, or losing a loved one. Less obvious life events may also cause stress, especially if they occur day after day or in combination. Examples include working long hours, driving in traffic, caring for children, being in debt, or being in a difficult relationship. SIGNS AND SYMPTOMS Stress may cause emotional symptoms including, the following:  Anxiety. This is feeling worried, afraid, on edge, overwhelmed, or out of control.  Anger. This is feeling irritated or impatient.  Depression. This is feeling sad, down, helpless, or guilty.  Difficulty focusing, remembering, or making decisions. Stress may cause physical symptoms, including the following:   Aches and pains. These may affect your head, neck, back, stomach, or other areas of your body.  Tight muscles or clenched jaw.  Low energy or trouble sleeping. Stress may cause unhealthy behaviors, including the following:   Eating to feel better (overeating) or skipping meals.  Sleeping too little, too much, or both.  Working  too much or putting off tasks (procrastination).  Smoking, drinking alcohol, or using drugs to feel better. DIAGNOSIS  Stress is diagnosed through an assessment by your health care provider. Your health care provider will ask questions about your symptoms and any stressful life events.Your health care provider will also ask about your medical history and may order blood tests or other tests. Certain medical conditions and medicine can cause physical symptoms similar to stress. Mental illness can cause emotional symptoms and unhealthy behaviors similar to stress. Your health care provider may refer you to a mental health professional for further evaluation.  TREATMENT  Stress management is the recommended treatment for stress.The goals of stress management are reducing stressful life events and coping with stress in healthy ways.  Techniques for reducing stressful life events include the following:  Stress identification. Self-monitor for stress and identify what causes stress for you. These skills may help you to avoid some stressful events.  Time management. Set your priorities, keep a calendar of events, and learn to say "no." These tools can help you avoid making too many commitments. Techniques for coping with stress include the following:  Rethinking the problem. Try to think realistically about stressful events rather than ignoring them or overreacting. Try to find the positives in a stressful situation rather than focusing on the negatives.  Exercise. Physical exercise can release both physical and emotional tension. The key is to find a form of exercise you enjoy and do it regularly.  Relaxation techniques. These relax the body and mind. Examples include yoga, meditation, tai chi, biofeedback, deep  breathing, progressive muscle relaxation, listening to music, being out in nature, journaling, and other hobbies. Again, the key is to find one or more that you enjoy and can do  regularly.  Healthy lifestyle. Eat a balanced diet, get plenty of sleep, and do not smoke. Avoid using alcohol or drugs to relax.  Strong support network. Spend time with family, friends, or other people you enjoy being around.Express your feelings and talk things over with someone you trust. Counseling or talktherapy with a mental health professional may be helpful if you are having difficulty managing stress on your own. Medicine is typically not recommended for the treatment of stress.Talk to your health care provider if you think you need medicine for symptoms of stress. HOME CARE INSTRUCTIONS  Keep all follow-up visits as directed by your health care provider.  Take all medicines as directed by your health care provider. SEEK MEDICAL CARE IF:  Your symptoms get worse or you start having new symptoms.  You feel overwhelmed by your problems and can no longer manage them on your own. SEEK IMMEDIATE MEDICAL CARE IF:  You feel like hurting yourself or someone else.   This information is not intended to replace advice given to you by your health care provider. Make sure you discuss any questions you have with your health care provider.   Document Released: 05/06/2001 Document Revised: 12/01/2014 Document Reviewed: 07/05/2013 Elsevier Interactive Patient Education 2016 Elsevier Inc.  

## 2016-07-05 ENCOUNTER — Other Ambulatory Visit: Payer: Self-pay | Admitting: Nurse Practitioner

## 2016-07-05 DIAGNOSIS — F411 Generalized anxiety disorder: Secondary | ICD-10-CM

## 2016-07-07 ENCOUNTER — Telehealth: Payer: Self-pay | Admitting: Nurse Practitioner

## 2016-07-07 NOTE — Telephone Encounter (Signed)
[facility] 

## 2016-07-07 NOTE — Telephone Encounter (Signed)
Patient was seen 8/8 for her anxiety and her Celexa was increased from 20mg  to 40mg . Patient states that her anxiety is not much better and she feels "crappy". Patient states that when she first started the Celexa it made her feel this way but since she had been on it and just a dose change she was wanting to know if it was going to make her feel that way again? Please advise

## 2016-07-07 NOTE — Telephone Encounter (Signed)
Will take about 2 weeks to get better- just keep taking

## 2016-07-07 NOTE — Telephone Encounter (Signed)
Last filled 04/16/16. Last seen 07/01/16. If approved please route to pool to have nurse called in.

## 2016-07-07 NOTE — Telephone Encounter (Signed)
Patient aware.

## 2016-07-07 NOTE — Telephone Encounter (Signed)
Please call in xanax with 1 refills 

## 2016-07-10 ENCOUNTER — Other Ambulatory Visit: Payer: Self-pay | Admitting: Nurse Practitioner

## 2016-07-30 ENCOUNTER — Other Ambulatory Visit: Payer: Self-pay | Admitting: Nurse Practitioner

## 2016-07-30 DIAGNOSIS — G4700 Insomnia, unspecified: Secondary | ICD-10-CM

## 2016-07-30 NOTE — Telephone Encounter (Signed)
Please forward to Gennette PacMary Margaret as she will be back tomorrow

## 2016-07-31 NOTE — Telephone Encounter (Signed)
Please call in ambien with 1 refills 

## 2016-08-04 ENCOUNTER — Ambulatory Visit (INDEPENDENT_AMBULATORY_CARE_PROVIDER_SITE_OTHER): Payer: 59 | Admitting: Family

## 2016-08-04 ENCOUNTER — Encounter: Payer: Self-pay | Admitting: Family

## 2016-08-04 VITALS — BP 129/82 | HR 82 | Temp 97.1°F | Ht 65.0 in | Wt 172.0 lb

## 2016-08-04 DIAGNOSIS — K649 Unspecified hemorrhoids: Secondary | ICD-10-CM | POA: Diagnosis not present

## 2016-08-04 MED ORDER — LIDOCAINE-HYDROCORTISONE ACE 3-0.5 % RE CREA: 1.0000 | TOPICAL_CREAM | Freq: Two times a day (BID) | RECTAL | 3 refills | Status: DC

## 2016-08-04 MED ORDER — HYDROCORTISONE ACETATE 25 MG RE SUPP: 25.0000 mg | Freq: Two times a day (BID) | RECTAL | 0 refills | Status: DC

## 2016-08-04 NOTE — Progress Notes (Signed)
   Subjective:    Patient ID: Christine [name], female    DOB: 04/22/1976, 40 y.o.   MRN: [id number]  HPI Pt presents to the office today with rectal pain related to hemorrhoids. Pt states she has had hemorrhoids in the past, but last week she started having pain. Pt states she is having constant pain 8 out 10 while sitting. PT has tried OTC ointment with no relief. PT denies any blood in stool.    Review of Systems  Constitutional: Negative.   HENT: Negative.   Eyes: Negative.   Respiratory: Negative.  Negative for shortness of breath.   Cardiovascular: Negative.  Negative for palpitations.  Gastrointestinal: Negative.   Endocrine: Negative.   Genitourinary: Negative.   Musculoskeletal: Negative.   Neurological: Negative.  Negative for headaches.  Hematological: Negative.   Psychiatric/Behavioral: Negative.   All other systems reviewed and are negative.      Objective:   Physical Exam  Constitutional: She is oriented to person, place, and time. She appears well-developed and well-nourished. No distress.  HENT:  Head: Normocephalic.  Eyes: Pupils are equal, round, and reactive to light.  Neck: Normal range of motion. Neck supple. No thyromegaly present.  Cardiovascular: Normal rate, regular rhythm, normal heart sounds and intact distal pulses.   No murmur heard. Pulmonary/Chest: Effort normal and breath sounds normal. No respiratory distress. She has no wheezes.  Abdominal: Soft. Bowel sounds are normal. She exhibits no distension. There is no tenderness.  Genitourinary:     Musculoskeletal: Normal range of motion. She exhibits no edema or tenderness.  Neurological: She is alert and oriented to person, place, and time. She has normal reflexes. No cranial nerve deficit.  Skin: Skin is warm and dry.  Psychiatric: She has a normal mood and affect. Her behavior is normal. Judgment and thought content normal.  Vitals reviewed.     BP 129/82   Pulse 82   Temp 97.1 F (36.2  C) (Oral)   Ht 5\' 5"  (1.651 m)   Wt 172 lb (78 kg)   BMI 28.62 kg/m      Assessment & Plan:  1. Hemorrhoids, unspecified hemorrhoid type -Increase fiber  -Force fluids -RTO prn   - hydrocortisone (ANUSOL-HC) 25 MG suppository; Place 1 suppository (25 mg total) rectally 2 (two) times daily.  Dispense: 12 suppository; Refill: 0 - lidocaine-hydrocortisone (ANAMANTEL HC) 3-0.5 % CREA; Place 1 Applicatorful rectally 2 (two) times daily.  Dispense: 85 g; Refill: 3  Jannifer Rodneyhristy Maicee Ullman, FNP

## 2016-08-04 NOTE — Addendum Note (Signed)
Addended by: Jannifer RodneyHAWKS, Kateria Cutrona A on: 08/04/2016 04:07 PM   Modules accepted: Orders

## 2016-08-04 NOTE — Patient Instructions (Signed)

## 2016-08-05 ENCOUNTER — Ambulatory Visit: Payer: 59 | Admitting: Nurse Practitioner

## 2016-08-06 ENCOUNTER — Telehealth: Payer: Self-pay | Admitting: *Deleted

## 2016-08-06 NOTE — Telephone Encounter (Signed)
Pt called to report worsening abd cramping and pain from hemmorhoids which is making anxiety worse. Questioning whether she needs US or xray Please review and advise

## 2016-08-07 ENCOUNTER — Telehealth: Payer: Self-pay | Admitting: Family

## 2016-08-07 MED ORDER — CITALOPRAM HYDROBROMIDE 40 MG PO TABS: 40.0000 mg | ORAL_TABLET | Freq: Every day | ORAL | 5 refills | Status: DC

## 2016-08-07 NOTE — Telephone Encounter (Signed)
Celexa was increased to 40 mg by Mary-Margaret back 8/8. She has an appt with the surgeon on Wed next week. What else may help with this. She is also going to increase her water intake to see if this also improves the sxs any.

## 2016-08-07 NOTE — Telephone Encounter (Signed)
Referral to general surgery ordered. Lets increase Celexa to 40 mg from 20 mg. This may help with anxiety. After removal of hemorrhoids and continues to have pain we will do more extensive testing.

## 2016-08-08 ENCOUNTER — Ambulatory Visit (INDEPENDENT_AMBULATORY_CARE_PROVIDER_SITE_OTHER): Payer: 59 | Admitting: Pediatrics

## 2016-08-08 VITALS — BP 126/85 | HR 88 | Temp 99.3°F | Ht 65.0 in | Wt 172.7 lb

## 2016-08-08 DIAGNOSIS — R1030 Lower abdominal pain, unspecified: Secondary | ICD-10-CM

## 2016-08-08 DIAGNOSIS — K5900 Constipation, unspecified: Secondary | ICD-10-CM

## 2016-08-08 LAB — URINALYSIS
BILIRUBIN UA: NEGATIVE
GLUCOSE, UA: NEGATIVE
Leukocytes, UA: NEGATIVE
NITRITE UA: NEGATIVE
Specific Gravity, UA: 1.025 (ref 1.005–1.030)
UUROB: 0.2 mg/dL (ref 0.2–1.0)
pH, UA: 6 (ref 5.0–7.5)

## 2016-08-08 NOTE — Telephone Encounter (Signed)
Patient states that when she was seen for the Hemorids she told Christine [name] that she was also having abdominal pain and the pain has not gotten any better. Patient is concerned and wanted to rule out anything else which was why she wanted the abdominal US. Please advise

## 2016-08-08 NOTE — Telephone Encounter (Signed)
The only diagnosis that I see for a referral for her is for hemorrhoids, I do not see anything about abdominal pain or things like that. Patient will likely need to come in and be seen if she has developed new symptoms from the last visit.

## 2016-08-08 NOTE — Patient Instructions (Signed)
Take miralax 2-3 times a day, goal for 1-2 soft stools every day You can take senna if needed but this may increase cramping Drink lots of water daily Goal is to not strain at all with passing stool

## 2016-08-08 NOTE — Telephone Encounter (Signed)
This will have to either be discussed with Christine [name] because she did not put it in her note or patient will need to be seen again.

## 2016-08-08 NOTE — Telephone Encounter (Signed)
Patient would like to know if she could get a stat referral of abdominal done today? Patient called yesterday as well and is waiting on a answer. Covering PCP, please advise both telephone calls.

## 2016-08-08 NOTE — Telephone Encounter (Signed)
Appointment made today at 4:45 with Dr. Loel RoVincennt

## 2016-08-08 NOTE — Progress Notes (Signed)
  Subjective:   Patient ID: Christine [name], female    DOB: 07/26/1976, 40 y.o.   MRN: [id number] CC: Abdominal Pain (Lower)  HPI: Christine [name] is a 40 y.o. female presenting for Abdominal Pain (Lower)  Lower abd pain starting a few days ago Last UTI years ago No dysuria Doesn't pass stool unless she takes something such as senna Seen in clinic two days ago for constipation, was supposed to start miralax as well, which has taken a coupl eof times Has been taking sennakot for years Taking miralax in the morning sennakot at night last night Increased water intake, helped some with abd pain This morning had fairly good large bowel movement x3 Going small amount past few days, not a large amount No nausea, no vomiting Appetite has been down but she thinks it is because of her anxiety Eating doesn't make pain worse  Anxiety: increased celexa recently to 40mg  Feeling better Increased anxiety hurts her appetite  Has hemorrhoids Not painful Do get irritated with passage of stool Once had scant amount of blood on toilet paper, no other bleeding  Relevant past medical, surgical, family and social history reviewed. Allergies and medications reviewed and updated. History  Smoking Status  . Never Smoker  Smokeless Tobacco  . Not on file   ROS: Per HPI   Objective:    BP 126/85   Pulse 88   Temp 99.3 F (37.4 C) (Oral)   Ht 5\' 5"  (1.651 m)   Wt 172 lb 11.2 oz (78.3 kg)   BMI 28.74 kg/m   Wt Readings from Last 3 Encounters:  08/08/16 172 lb 11.2 oz (78.3 kg)  08/04/16 172 lb (78 kg)  07/01/16 185 lb (83.9 kg)     Gen: NAD, alert, cooperative with exam, NCAT EYES: EOMI, no conjunctival injection, or no icterus ENT:  TMs pearly gray b/l, OP without erythema LYMPH: no cervical LAD CV: NRRR, normal S1/S2 Resp: CTABL, no wheezes, normal WOB Abd: +BS, soft, mildly tender lower abd with palpation, ND, no guarding, rebound or organomegaly Ext: No edema, warm Neuro: Alert and  oriented  Assessment & Plan:  Christine [name] was seen today for abdominal pain. Likely related to constipation as pain did get better with passage of stools. Thought to have some component of IBS past few months.  Diagnoses and all orders for this visit:  Constipation, unspecified constipation type Take miralax Increase water intake  Lower abdominal pain UA without infection cells, did show small ketones, trace protein and blood. Increase water intake. Needs repeat UA in 1 month. -     Urine culture -     Urinalysis   Follow up plan: Return if symptoms worsen or fail to improve. Rex Krasarol Abigale Dorow, MD [facility]

## 2016-08-10 LAB — URINE CULTURE

## 2016-08-14 ENCOUNTER — Other Ambulatory Visit: Payer: Self-pay | Admitting: Nurse Practitioner

## 2016-08-14 DIAGNOSIS — F411 Generalized anxiety disorder: Secondary | ICD-10-CM

## 2016-08-14 NOTE — Telephone Encounter (Signed)
Please call in alprazolam with 1 refills 

## 2016-08-14 NOTE — Telephone Encounter (Signed)
Last seen 07/01/16 with you, last rx'd 07/07/16 #20 take 1-2 times daily PRN with one refill. If approved please route to Nurse Pool B for nurse to call in.

## 2016-08-15 ENCOUNTER — Ambulatory Visit
Admission: RE | Admit: 2016-08-15 | Discharge: 2016-08-15 | Disposition: A | Discharge status: Home / Self Care | Payer: Commercial Managed Care - HMO | Source: Ambulatory Visit | Attending: Gastroenterology | Admitting: Gastroenterology

## 2016-08-15 ENCOUNTER — Other Ambulatory Visit: Payer: Self-pay | Admitting: Gastroenterology

## 2016-08-15 DIAGNOSIS — R634 Abnormal weight loss: Secondary | ICD-10-CM

## 2016-08-15 DIAGNOSIS — R1084 Generalized abdominal pain: Secondary | ICD-10-CM

## 2016-08-15 DIAGNOSIS — R198 Other specified symptoms and signs involving the digestive system and abdomen: Secondary | ICD-10-CM

## 2016-08-18 ENCOUNTER — Telehealth: Payer: Self-pay | Admitting: Nurse Practitioner

## 2016-08-18 NOTE — Telephone Encounter (Signed)
Will need to be seen to discuss 

## 2016-08-18 NOTE — Telephone Encounter (Signed)
Returned patient's phone call.  Patient states that she feels like anxiety medication is not helping anymore.

## 2016-08-18 NOTE — Telephone Encounter (Signed)
Appointment made

## 2016-08-19 ENCOUNTER — Encounter: Payer: Self-pay | Admitting: Nurse Practitioner

## 2016-08-19 ENCOUNTER — Ambulatory Visit (INDEPENDENT_AMBULATORY_CARE_PROVIDER_SITE_OTHER): Payer: 59 | Admitting: Nurse Practitioner

## 2016-08-19 VITALS — BP 133/85 | HR 76 | Temp 98.1°F | Ht 65.0 in | Wt 168.6 lb

## 2016-08-19 DIAGNOSIS — F411 Generalized anxiety disorder: Secondary | ICD-10-CM

## 2016-08-19 DIAGNOSIS — Z23 Encounter for immunization: Secondary | ICD-10-CM

## 2016-08-19 MED ORDER — SERTRALINE HCL 100 MG PO TABS: 100.0000 mg | ORAL_TABLET | Freq: Every day | ORAL | 5 refills | Status: DC

## 2016-08-19 NOTE — Patient Instructions (Signed)
Stress and Stress Management Stress is a normal reaction to life events. It is what you feel when life demands more than you are used to or more than you can handle. Some stress can be useful. For example, the stress reaction can help you catch the last bus of the day, study for a test, or meet a deadline at work. But stress that occurs too often or for too long can cause problems. It can affect your emotional health and interfere with relationships and normal daily activities. Too much stress can weaken your immune system and increase your risk for physical illness. If you already have a medical problem, stress can make it worse. CAUSES  All sorts of life events may cause stress. An event that causes stress for one person may not be stressful for another person. Major life events commonly cause stress. These may be positive or negative. Examples include losing your job, moving into a new home, getting married, having a baby, or losing a loved one. Less obvious life events may also cause stress, especially if they occur day after day or in combination. Examples include working long hours, driving in traffic, caring for children, being in debt, or being in a difficult relationship. SIGNS AND SYMPTOMS Stress may cause emotional symptoms including, the following:  Anxiety. This is feeling worried, afraid, on edge, overwhelmed, or out of control.  Anger. This is feeling irritated or impatient.  Depression. This is feeling sad, down, helpless, or guilty.  Difficulty focusing, remembering, or making decisions. Stress may cause physical symptoms, including the following:   Aches and pains. These may affect your head, neck, back, stomach, or other areas of your body.  Tight muscles or clenched jaw.  Low energy or trouble sleeping. Stress may cause unhealthy behaviors, including the following:   Eating to feel better (overeating) or skipping meals.  Sleeping too little, too much, or both.  Working  too much or putting off tasks (procrastination).  Smoking, drinking alcohol, or using drugs to feel better. DIAGNOSIS  Stress is diagnosed through an assessment by your health care provider. Your health care provider will ask questions about your symptoms and any stressful life events.Your health care provider will also ask about your medical history and may order blood tests or other tests. Certain medical conditions and medicine can cause physical symptoms similar to stress. Mental illness can cause emotional symptoms and unhealthy behaviors similar to stress. Your health care provider may refer you to a mental health professional for further evaluation.  TREATMENT  Stress management is the recommended treatment for stress.The goals of stress management are reducing stressful life events and coping with stress in healthy ways.  Techniques for reducing stressful life events include the following:  Stress identification. Self-monitor for stress and identify what causes stress for you. These skills may help you to avoid some stressful events.  Time management. Set your priorities, keep a calendar of events, and learn to say "no." These tools can help you avoid making too many commitments. Techniques for coping with stress include the following:  Rethinking the problem. Try to think realistically about stressful events rather than ignoring them or overreacting. Try to find the positives in a stressful situation rather than focusing on the negatives.  Exercise. Physical exercise can release both physical and emotional tension. The key is to find a form of exercise you enjoy and do it regularly.  Relaxation techniques. These relax the body and mind. Examples include yoga, meditation, tai chi, biofeedback, deep  breathing, progressive muscle relaxation, listening to music, being out in nature, journaling, and other hobbies. Again, the key is to find one or more that you enjoy and can do  regularly.  Healthy lifestyle. Eat a balanced diet, get plenty of sleep, and do not smoke. Avoid using alcohol or drugs to relax.  Strong support network. Spend time with family, friends, or other people you enjoy being around.Express your feelings and talk things over with someone you trust. Counseling or talktherapy with a mental health professional may be helpful if you are having difficulty managing stress on your own. Medicine is typically not recommended for the treatment of stress.Talk to your health care provider if you think you need medicine for symptoms of stress. HOME CARE INSTRUCTIONS  Keep all follow-up visits as directed by your health care provider.  Take all medicines as directed by your health care provider. SEEK MEDICAL CARE IF:  Your symptoms get worse or you start having new symptoms.  You feel overwhelmed by your problems and can no longer manage them on your own. SEEK IMMEDIATE MEDICAL CARE IF:  You feel like hurting yourself or someone else.   This information is not intended to replace advice given to you by your health care provider. Make sure you discuss any questions you have with your health care provider.   Document Released: 05/06/2001 Document Revised: 12/01/2014 Document Reviewed: 07/05/2013 Elsevier Interactive Patient Education 2016 Elsevier Inc.  

## 2016-08-19 NOTE — Progress Notes (Signed)
   Subjective:    Patient ID: Christine [name], female    DOB: 06/09/1976, 40 y.o.   MRN: [id number]   Patient here for follow-up on her anxiety problem. She was seen 7 weeks ago here in the clinic. At that visit her Celexa was increased from 20 mg to 40 mg daily. She reports that symptoms are better but she is still not where she wants to be. She report having a lot stress from work- works as an Scientist, water qualityaccount manager. [facility] 08/19/2016 08/19/2016 08/08/2016 08/04/2016 07/01/2016  Decreased Interest 0 2 1 1  0  Down, Depressed, Hopeless 1 2 1 1  0  PHQ - 2 Score 1 4 2 2  0  Altered sleeping 3 3 2 2  -  Tired, decreased energy 3 3 3 3  -  Change in appetite 3 3 3 3  -  Feeling bad or failure about yourself  2 2 2 2  -  Trouble concentrating 1 1 1 1  -  Moving slowly or fidgety/restless 0 0 0 0 -  Suicidal thoughts 0 0 0 0 -  PHQ-9 Score 13 16 13 13  -  Difficult doing work/chores - - Somewhat difficult - -   GAD 7 : Generalized Anxiety Score 08/19/2016 08/08/2016 07/01/2016  Nervous, Anxious, on Edge 3 3 3   Control/stop worrying 2 3 3   Worry too much - different things 3 3 3   Trouble relaxing 2 2 2   Restless 0 0 0  Easily annoyed or irritable 0 2 2  Afraid - awful might happen 2 3 3   Total GAD 7 Score 12 16 16   Anxiety Difficulty Not difficult at all Very difficult Very difficult     - She followed up with GI, for her complaint of hemorrhoids, she had CT of the abdomen. Patient reported being told by GI, there was no indication for surgery.  Marland Kitchen. HPI Review of Systems  Constitutional: Positive for appetite change (poor appetite), fatigue and unexpected weight change (lost weight without trying). Negative for chills and fever.  HENT: Negative.   Respiratory: Positive for chest tightness (with anxiety). Negative for wheezing.   Cardiovascular: Negative.   Gastrointestinal: Positive for constipation. Negative for abdominal distention, abdominal pain, anal bleeding, blood in stool and  vomiting.       Objective:   Physical Exam  Constitutional: She is oriented to person, place, and time. She appears well-developed and well-nourished.  HENT:  Head: Normocephalic.  Cardiovascular: Normal rate and regular rhythm.   Pulmonary/Chest: Effort normal and breath sounds normal. No respiratory distress. She has no wheezes.  Abdominal: Soft. Bowel sounds are normal. She exhibits no distension.  Neurological: She is alert and oriented to person, place, and time.  Psychiatric: She has a normal mood and affect. Her behavior is normal. Judgment and thought content normal.    BP 133/85   Pulse 76   Temp 98.1 F (36.7 C) (Oral)   Ht 5\' 5"  (1.651 m)   Wt 168 lb 9.6 oz (76.5 kg)   LMP 07/29/2016   BMI 28.06 kg/m       Assessment & Plan:  1. Generalized anxiety disorder Stress management Stop Celexa and start Zoloft as prescribed - sertraline (ZOLOFT) 100 MG tablet; Take 1 tablet (100 mg total) by mouth daily.  Dispense: 30 tablet; Refill: 5    Follow up  in 1 month - for CPE , no pap and lab work.  Mary-Margaret Daphine DeutscherMartin, FNP

## 2016-09-01 ENCOUNTER — Other Ambulatory Visit: Payer: Self-pay | Admitting: Nurse Practitioner

## 2016-09-01 DIAGNOSIS — G4700 Insomnia, unspecified: Secondary | ICD-10-CM

## 2016-09-02 ENCOUNTER — Telehealth: Payer: Self-pay | Admitting: Nurse Practitioner

## 2016-09-02 NOTE — Telephone Encounter (Signed)
Patient would like a Rx for IBS sent to pharmacy.

## 2016-09-02 NOTE — Telephone Encounter (Signed)
Patient will call GI for Rx for IBS

## 2016-09-03 ENCOUNTER — Other Ambulatory Visit: Payer: Self-pay | Admitting: Allergy and Immunology

## 2016-09-03 ENCOUNTER — Other Ambulatory Visit: Payer: Self-pay | Admitting: Nurse Practitioner

## 2016-09-03 NOTE — Telephone Encounter (Signed)
Please advise on refill for patient and if she needs to be seen for bowel problems.

## 2016-09-03 NOTE — Telephone Encounter (Signed)
First line treatment for IBS are diet changes, there arent any medications that would be appropriate right now without an office visit. If she is having constipation she should continue miralax and increased water intake. Often certain foods make IBS worse, she should keep a food diary of what she ate and how her symptoms were and bring to next clinic visit. Foods that tend to make IBS worse include foods that produce gas  (eg, beans, onions, celery, carrots, raisins, bananas, apricots, prunes, Brussels sprouts, wheat germ, pretzels, and bagels), alcohol, and caffeine so she can try to avoid those, come back to be seen if still with symptoms.

## 2016-09-03 NOTE — Telephone Encounter (Signed)
(  Second call this morning.)Patient thinks she has IBS after reviewing her symptoms on line.  She wants one of the newer medictions for this.  She was advised she may need an appointment to review with a provider.  Also, advised that Ms. Daphine DeutscherMartin was out of office and we would call her as soon as we could.

## 2016-09-03 NOTE — Telephone Encounter (Signed)
Aware of doctor's suggestions.  Patient says she has tried all this and had multiple tests in the past.

## 2016-09-03 NOTE — Telephone Encounter (Signed)
Patient needs office visit.  

## 2016-09-05 MED ORDER — DICYCLOMINE HCL 10 MG PO CAPS: 10.0000 mg | ORAL_CAPSULE | Freq: Three times a day (TID) | ORAL | 0 refills | Status: DC

## 2016-09-05 NOTE — Telephone Encounter (Signed)
Left detailed message on patients voicemail that rx sent to pharmacy 

## 2016-09-05 NOTE — Telephone Encounter (Signed)
Bentyl sent in, will need to be seen for any adjustments or new requests.   Murtis SinkSam Bradshaw, MD [facility] 09/05/2016, 2:24 PM

## 2016-09-13 ENCOUNTER — Other Ambulatory Visit: Payer: Self-pay | Admitting: Allergy and Immunology

## 2016-09-18 ENCOUNTER — Ambulatory Visit (INDEPENDENT_AMBULATORY_CARE_PROVIDER_SITE_OTHER): Payer: 59 | Admitting: Nurse Practitioner

## 2016-09-18 ENCOUNTER — Encounter: Payer: Self-pay | Admitting: Nurse Practitioner

## 2016-09-18 VITALS — BP 127/80 | HR 71 | Temp 96.7°F | Ht 65.0 in | Wt 164.0 lb

## 2016-09-18 DIAGNOSIS — Z0000 Encounter for general adult medical examination without abnormal findings: Secondary | ICD-10-CM | POA: Diagnosis not present

## 2016-09-18 DIAGNOSIS — E559 Vitamin D deficiency, unspecified: Secondary | ICD-10-CM

## 2016-09-18 DIAGNOSIS — J45901 Unspecified asthma with (acute) exacerbation: Secondary | ICD-10-CM

## 2016-09-18 DIAGNOSIS — Z23 Encounter for immunization: Secondary | ICD-10-CM

## 2016-09-18 DIAGNOSIS — F411 Generalized anxiety disorder: Secondary | ICD-10-CM

## 2016-09-18 DIAGNOSIS — F5101 Primary insomnia: Secondary | ICD-10-CM

## 2016-09-18 MED ORDER — MONTELUKAST SODIUM 10 MG PO TABS: 10.0000 mg | ORAL_TABLET | Freq: Every day | ORAL | 0 refills | Status: DC

## 2016-09-18 MED ORDER — ZOLPIDEM TARTRATE 5 MG PO TABS: 5.0000 mg | ORAL_TABLET | Freq: Every evening | ORAL | 2 refills | Status: DC | PRN

## 2016-09-18 NOTE — Progress Notes (Signed)
Subjective:    Patient ID: Christine [name], female    DOB: [birth year], 40 y.o.   MRN: [id number]  HPI 40 y/o white female presents today for a follow-up since changing her anxiety medication and blood work. The patient was seen on 9/26 and changed from Celexa to Zoloft 100 mg PO daily. The patient does report she feels like it is helping.  GAD 7 : Generalized Anxiety Score 09/18/2016 08/19/2016 08/08/2016 07/01/2016  Nervous, Anxious, on Edge 0 3 3 3   Control/stop worrying 1 2 3 3   Worry too much - different things 0 3 3 3   Trouble relaxing 1 2 2 2   Restless 0 0 0 0  Easily annoyed or irritable 1 0 2 2  Afraid - awful might happen 0 2 3 3   Total GAD 7 Score 3 12 16 16   Anxiety Difficulty Not difficult at all Not difficult at all Very difficult Very difficult   Insomnia Patient reports she is getting better sleep at night and not needed to take the Ambien as often. Asthma Patient reports she only uses her Albuterol inhaler and no scheduled inhalers. Takes Singulair daily as well. She is using the Albuterol inhaler less often than before switching her anxiety medication.   Review of Systems  Constitutional: Positive for appetite change and unexpected weight change.       Prior to changing anxiety medication last month the patient had lost ~30 lbs. Appetite coming back now.  HENT: Negative.   Eyes: Negative.   Respiratory: Positive for chest tightness and shortness of breath.        First thing in the morning prior to medications.  Cardiovascular: Negative.   Gastrointestinal: Positive for constipation.  Endocrine: Negative.   Genitourinary: Negative.   Musculoskeletal: Negative.   Skin: Negative.   Allergic/Immunologic: Positive for environmental allergies and food allergies.  Neurological: Negative.   Hematological: Negative.   Psychiatric/Behavioral: Positive for sleep disturbance. Negative for suicidal ideas. The patient is nervous/anxious.       Objective:   Physical Exam    Constitutional: She is oriented to person, place, and time. She appears well-developed and well-nourished.  HENT:  Head: Normocephalic.  Right Ear: External ear normal.  Left Ear: External ear normal.  Mouth/Throat: Oropharynx is clear and moist.  Eyes: Conjunctivae and EOM are normal. Pupils are equal, round, and reactive to light.  Neck: Normal range of motion. Neck supple. No thyromegaly present.  Cardiovascular: Normal rate, regular rhythm and normal heart sounds.   Pulmonary/Chest: Effort normal and breath sounds normal.  Abdominal: Soft. Bowel sounds are normal.  Musculoskeletal: Normal range of motion.  Lymphadenopathy:    She has no cervical adenopathy.  Neurological: She is alert and oriented to person, place, and time.  Skin: Skin is warm and dry.  Psychiatric: She has a normal mood and affect. Her behavior is normal. Judgment and thought content normal.   BP 127/80   Pulse 71   Temp (!) 96.7 F (35.9 C) (Oral)   Ht 5' 5"  (1.651 m)   Wt 164 lb (74.4 kg)   LMP 07/29/2016   BMI 27.29 kg/m      Assessment & Plan:  1. Annual physical exam - CMP14+EGFR - Lipid panel - Thyroid Panel With TSH - CBC with Differential  2. Generalized anxiety disorder Continue current medications. Improving.  3. Vitamin D deficiency - VITAMIN D 25 Hydroxy (Vit-D Deficiency, Fractures)  4. Primary insomnia Establish a bedtime routine. - zolpidem (AMBIEN) 5 MG  tablet; Take 1 tablet (5 mg total) by mouth at bedtime as needed.  Dispense: 30 tablet; Refill: 2  5. Asthma with acute exacerbation, unspecified asthma severity, unspecified whether persistent Keep a log of how often the Albuterol inhaler is needed and bring to the next appointment.   Labs pending Health maintenance reviewed Diet and exercise encouraged Continue all meds Follow up in 3 months   Hendricks Limes, BSN-RN/FNP student  [place], [place]

## 2016-09-18 NOTE — Addendum Note (Signed)
Addended by: Cleda DaubUCKER, Rojean Ige G on: 09/18/2016 05:18 PM   Modules accepted: Orders

## 2016-09-18 NOTE — Patient Instructions (Signed)
Stress and Stress Management Stress is a normal reaction to life events. It is what you feel when life demands more than you are used to or more than you can handle. Some stress can be useful. For example, the stress reaction can help you catch the last bus of the day, study for a test, or meet a deadline at work. But stress that occurs too often or for too long can cause problems. It can affect your emotional health and interfere with relationships and normal daily activities. Too much stress can weaken your immune system and increase your risk for physical illness. If you already have a medical problem, stress can make it worse. CAUSES  All sorts of life events may cause stress. An event that causes stress for one person may not be stressful for another person. Major life events commonly cause stress. These may be positive or negative. Examples include losing your job, moving into a new home, getting married, having a baby, or losing a loved one. Less obvious life events may also cause stress, especially if they occur day after day or in combination. Examples include working long hours, driving in traffic, caring for children, being in debt, or being in a difficult relationship. SIGNS AND SYMPTOMS Stress may cause emotional symptoms including, the following:  Anxiety. This is feeling worried, afraid, on edge, overwhelmed, or out of control.  Anger. This is feeling irritated or impatient.  Depression. This is feeling sad, down, helpless, or guilty.  Difficulty focusing, remembering, or making decisions. Stress may cause physical symptoms, including the following:   Aches and pains. These may affect your head, neck, back, stomach, or other areas of your body.  Tight muscles or clenched jaw.  Low energy or trouble sleeping. Stress may cause unhealthy behaviors, including the following:   Eating to feel better (overeating) or skipping meals.  Sleeping too little, too much, or both.  Working  too much or putting off tasks (procrastination).  Smoking, drinking alcohol, or using drugs to feel better. DIAGNOSIS  Stress is diagnosed through an assessment by your health care provider. Your health care provider will ask questions about your symptoms and any stressful life events.Your health care provider will also ask about your medical history and may order blood tests or other tests. Certain medical conditions and medicine can cause physical symptoms similar to stress. Mental illness can cause emotional symptoms and unhealthy behaviors similar to stress. Your health care provider may refer you to a mental health professional for further evaluation.  TREATMENT  Stress management is the recommended treatment for stress.The goals of stress management are reducing stressful life events and coping with stress in healthy ways.  Techniques for reducing stressful life events include the following:  Stress identification. Self-monitor for stress and identify what causes stress for you. These skills may help you to avoid some stressful events.  Time management. Set your priorities, keep a calendar of events, and learn to say "no." These tools can help you avoid making too many commitments. Techniques for coping with stress include the following:  Rethinking the problem. Try to think realistically about stressful events rather than ignoring them or overreacting. Try to find the positives in a stressful situation rather than focusing on the negatives.  Exercise. Physical exercise can release both physical and emotional tension. The key is to find a form of exercise you enjoy and do it regularly.  Relaxation techniques. These relax the body and mind. Examples include yoga, meditation, tai chi, biofeedback, deep  breathing, progressive muscle relaxation, listening to music, being out in nature, journaling, and other hobbies. Again, the key is to find one or more that you enjoy and can do  regularly.  Healthy lifestyle. Eat a balanced diet, get plenty of sleep, and do not smoke. Avoid using alcohol or drugs to relax.  Strong support network. Spend time with family, friends, or other people you enjoy being around.Express your feelings and talk things over with someone you trust. Counseling or talktherapy with a mental health professional may be helpful if you are having difficulty managing stress on your own. Medicine is typically not recommended for the treatment of stress.Talk to your health care provider if you think you need medicine for symptoms of stress. HOME CARE INSTRUCTIONS  Keep all follow-up visits as directed by your health care provider.  Take all medicines as directed by your health care provider. SEEK MEDICAL CARE IF:  Your symptoms get worse or you start having new symptoms.  You feel overwhelmed by your problems and can no longer manage them on your own. SEEK IMMEDIATE MEDICAL CARE IF:  You feel like hurting yourself or someone else.   This information is not intended to replace advice given to you by your health care provider. Make sure you discuss any questions you have with your health care provider.   Document Released: 05/06/2001 Document Revised: 12/01/2014 Document Reviewed: 07/05/2013 Elsevier Interactive Patient Education [employer].

## 2016-09-19 ENCOUNTER — Encounter: Payer: Self-pay | Admitting: Nurse Practitioner

## 2016-09-19 LAB — LIPID PANEL
CHOLESTEROL TOTAL: 245 mg/dL — AB (ref 100–199)
Chol/HDL Ratio: 4.9 ratio units — ABNORMAL HIGH (ref 0.0–4.4)
HDL: 50 mg/dL (ref >39)
LDL Calculated: 165 mg/dL — ABNORMAL HIGH (ref 0–99)
TRIGLYCERIDES: 149 mg/dL (ref 0–149)
VLDL Cholesterol Cal: 30 mg/dL (ref 5–40)

## 2016-09-19 LAB — CBC WITH DIFFERENTIAL/PLATELET
BASOS: 1 %
Basophils Absolute: 0 10*3/uL (ref 0.0–0.2)
EOS (ABSOLUTE): 0.1 10*3/uL (ref 0.0–0.4)
Eos: 2 %
Hematocrit: 41.2 % (ref 34.0–46.6)
Hemoglobin: 13.6 g/dL (ref 11.1–15.9)
IMMATURE GRANULOCYTES: 0 %
Immature Grans (Abs): 0 10*3/uL (ref 0.0–0.1)
Lymphocytes Absolute: 2 10*3/uL (ref 0.7–3.1)
Lymphs: 26 %
MCH: 30.8 pg (ref 26.6–33.0)
MCHC: 33 g/dL (ref 31.5–35.7)
MCV: 93 fL (ref 79–97)
MONOS ABS: 0.3 10*3/uL (ref 0.1–0.9)
Monocytes: 4 %
Neutrophils Absolute: 5.1 10*3/uL (ref 1.4–7.0)
Neutrophils: 67 %
PLATELETS: 296 10*3/uL (ref 150–379)
RBC: 4.42 x10E6/uL (ref 3.77–5.28)
RDW: 13.8 % (ref 12.3–15.4)
WBC: 7.6 10*3/uL (ref 3.4–10.8)

## 2016-09-19 LAB — CMP14+EGFR
A/G RATIO: 1.7 (ref 1.2–2.2)
ALK PHOS: 70 IU/L (ref 39–117)
ALT: 14 IU/L (ref 0–32)
AST: 14 IU/L (ref 0–40)
Albumin: 4.6 g/dL (ref 3.5–5.5)
BUN/Creatinine Ratio: 10 (ref 9–23)
BUN: 7 mg/dL (ref 6–24)
Bilirubin Total: 0.3 mg/dL (ref 0.0–1.2)
CO2: 23 mmol/L (ref 18–29)
Calcium: 9.6 mg/dL (ref 8.7–10.2)
Chloride: 101 mmol/L (ref 96–106)
Creatinine, Ser: 0.72 mg/dL (ref 0.57–1.00)
GFR calc Af Amer: 119 mL/min/{1.73_m2} (ref >59)
GFR calc non Af Amer: 104 mL/min/{1.73_m2} (ref >59)
GLOBULIN, TOTAL: 2.7 g/dL (ref 1.5–4.5)
Glucose: 82 mg/dL (ref 65–99)
POTASSIUM: 5.5 mmol/L — AB (ref 3.5–5.2)
SODIUM: 139 mmol/L (ref 134–144)
Total Protein: 7.3 g/dL (ref 6.0–8.5)

## 2016-09-19 LAB — THYROID PANEL WITH TSH
Free Thyroxine Index: 1.5 (ref 1.2–4.9)
T3 UPTAKE RATIO: 17 % — AB (ref 24–39)
T4 TOTAL: 8.7 ug/dL (ref 4.5–12.0)
TSH: 0.508 u[IU]/mL (ref 0.450–4.500)

## 2016-09-19 LAB — VITAMIN D 25 HYDROXY (VIT D DEFICIENCY, FRACTURES): Vit D, 25-Hydroxy: 43.5 ng/mL (ref 30.0–100.0)

## 2016-09-20 ENCOUNTER — Other Ambulatory Visit (INDEPENDENT_AMBULATORY_CARE_PROVIDER_SITE_OTHER): Payer: 59

## 2016-09-20 ENCOUNTER — Other Ambulatory Visit: Payer: Self-pay | Admitting: *Deleted

## 2016-09-20 DIAGNOSIS — Z6832 Body mass index (BMI) 32.0-32.9, adult: Secondary | ICD-10-CM

## 2016-09-20 DIAGNOSIS — R195 Other fecal abnormalities: Secondary | ICD-10-CM

## 2016-09-21 LAB — BMP8+EGFR
BUN / CREAT RATIO: 11 (ref 9–23)
BUN: 8 mg/dL (ref 6–24)
CHLORIDE: 101 mmol/L (ref 96–106)
CO2: 22 mmol/L (ref 18–29)
Calcium: 9.4 mg/dL (ref 8.7–10.2)
Creatinine, Ser: 0.74 mg/dL (ref 0.57–1.00)
GFR calc Af Amer: 116 mL/min/{1.73_m2} (ref >59)
GFR calc non Af Amer: 100 mL/min/{1.73_m2} (ref >59)
GLUCOSE: 91 mg/dL (ref 65–99)
Potassium: 4.8 mmol/L (ref 3.5–5.2)
SODIUM: 138 mmol/L (ref 134–144)

## 2016-11-14 ENCOUNTER — Other Ambulatory Visit: Payer: Self-pay | Admitting: Allergy and Immunology

## 2016-12-01 ENCOUNTER — Encounter: Payer: Self-pay | Admitting: Allergy and Immunology

## 2016-12-01 ENCOUNTER — Ambulatory Visit (INDEPENDENT_AMBULATORY_CARE_PROVIDER_SITE_OTHER): Payer: 59 | Admitting: Allergy and Immunology

## 2016-12-01 VITALS — BP 122/80 | HR 92 | Resp 20 | Ht 65.0 in | Wt 175.8 lb

## 2016-12-01 DIAGNOSIS — Z91018 Allergy to other foods: Secondary | ICD-10-CM | POA: Diagnosis not present

## 2016-12-01 DIAGNOSIS — J0100 Acute maxillary sinusitis, unspecified: Secondary | ICD-10-CM | POA: Diagnosis not present

## 2016-12-01 DIAGNOSIS — J4531 Mild persistent asthma with (acute) exacerbation: Secondary | ICD-10-CM | POA: Diagnosis not present

## 2016-12-01 MED ORDER — BECLOMETHASONE DIPROPIONATE 40 MCG/ACT IN AERS
2.0000 | INHALATION_SPRAY | Freq: Two times a day (BID) | RESPIRATORY_TRACT | 3 refills | Status: DC
Start: 2016-12-01 — End: 2017-02-17

## 2016-12-01 MED ORDER — PREDNISONE 1 MG PO TABS: 10.0000 mg | ORAL_TABLET | Freq: Every day | ORAL | Status: DC

## 2016-12-01 MED ORDER — MONTELUKAST SODIUM 10 MG PO TABS: 10.0000 mg | ORAL_TABLET | Freq: Every day | ORAL | 5 refills | Status: DC

## 2016-12-01 MED ORDER — AZELASTINE HCL 0.1 % NA SOLN: NASAL | 5 refills | Status: DC

## 2016-12-01 NOTE — Assessment & Plan Note (Addendum)
Mild exacerbation.  Prednisone has been provided (as above).  Continue montelukast 10 mg daily at bedtime.   For now, and during all respiratory tract infections or asthma flares, add Qvar 40 g, 2 inhalations via spacer device twice a day until symptoms have returned baseline.  A sample and prescription have been provided.  The patient has been asked to contact me if her symptoms persist or progress.

## 2016-12-01 NOTE — Patient Instructions (Addendum)
Acute sinusitis  Prednisone has been provided, 20 mg x 4 days, 10 mg x1 day, then stop.  A prescription has been provided for azelastine nasal spray, 1-2 sprays per nostril 2 times daily as needed. Proper nasal spray technique has been discussed and demonstrated.   Continue fluticasone nasal spray.  Nasal saline lavage (NeilMed) has been recommended prior to medicated nasal sprays and as needed along with instructions for proper administration.  For thick post nasal drainage, add guaifenesin 1200 mg (Mucinex Maximum Strength)  twice daily as needed with adequate hydration as discussed.  The patient has been asked to contact me if her symptoms persist, progress, or if she becomes febrile. Otherwise, she may return for follow up in 4 months.  Mild persistent asthma Mild exacerbation.  Prednisone has been provided (as above).  Continue montelukast 10 mg daily at bedtime.   For now, and during all respiratory tract infections or asthma flares, add Qvar 40 g, 2 inhalations via spacer device twice a day until symptoms have returned baseline.  A sample and prescription have been provided.  The patient has been asked to contact me if her symptoms persist or progress.   Allergy to meat  A lab order form has been provided for alpha gal panel.  For now, continue meticulous avoidance of mammalian meats and have access to epinephrine autoinjector 2 pack in case of accidental ingestion.  Food allergy action plan is in place.   When lab results have returned the patient will be called with further recommendations and follow up instructions.

## 2016-12-01 NOTE — Assessment & Plan Note (Signed)
   A lab order form has been provided for alpha gal panel.  For now, continue meticulous avoidance of mammalian meats and have access to epinephrine autoinjector 2 pack in case of accidental ingestion.  Food allergy action plan is in place.

## 2016-12-01 NOTE — Assessment & Plan Note (Signed)
   Prednisone has been provided, 20 mg x 4 days, 10 mg x1 day, then stop.  A prescription has been provided for azelastine nasal spray, 1-2 sprays per nostril 2 times daily as needed. Proper nasal spray technique has been discussed and demonstrated.   Continue fluticasone nasal spray.  Nasal saline lavage (NeilMed) has been recommended prior to medicated nasal sprays and as needed along with instructions for proper administration.  For thick post nasal drainage, add guaifenesin 1200 mg (Mucinex Maximum Strength)  twice daily as needed with adequate hydration as discussed.  The patient has been asked to contact me if her symptoms persist, progress, or if she becomes febrile. Otherwise, she may return for follow up in 4 months.

## 2016-12-01 NOTE — Progress Notes (Signed)
Follow-up Note  RE: Christine [name] MRN: [id number] DOB: [birth year] Date of Office Visit: 12/01/2016  Primary care provider: Bennie Pierini, FNP Referring provider: Daphine Deutscher, Mary-Margaret, *  History of present illness: Christine [name] is a 41 y.o. female with persistent asthma, allergic rhinitis, and alpha gal hypersensitivity presenting today for follow up.  She was last seen in this clinic in March 2017.  She reports that over this past week she has been experiencing nasal congestion, rhinorrhea, postnasal drainage, and sinus pressure over her cheekbones.  She has not been experiencing fevers, chills, or discolored mucus production.  She also complains of dyspnea and chest tightness over this past week.  She takes montelukast daily and ProAir HFA as needed.  She carefully avoids mammalian meat and has access to epinephrine autoinjectors in case of accidental ingestion followed by systemic symptoms.   Assessment and plan: Acute sinusitis  Prednisone has been provided, 20 mg x 4 days, 10 mg x1 day, then stop.  A prescription has been provided for azelastine nasal spray, 1-2 sprays per nostril 2 times daily as needed. Proper nasal spray technique has been discussed and demonstrated.   Continue fluticasone nasal spray.  Nasal saline lavage (NeilMed) has been recommended prior to medicated nasal sprays and as needed along with instructions for proper administration.  For thick post nasal drainage, add guaifenesin 1200 mg (Mucinex Maximum Strength)  twice daily as needed with adequate hydration as discussed.  The patient has been asked to contact me if her symptoms persist, progress, or if she becomes febrile. Otherwise, she may return for follow up in 4 months.  Mild persistent asthma Mild exacerbation.  Prednisone has been provided (as above).  Continue montelukast 10 mg daily at bedtime.   For now, and during all respiratory tract infections or asthma flares, add Qvar 40 g,  2 inhalations via spacer device twice a day until symptoms have returned baseline.  A sample and prescription have been provided.  The patient has been asked to contact me if her symptoms persist or progress.   Allergy to meat  A lab order form has been provided for alpha gal panel.  For now, continue meticulous avoidance of mammalian meats and have access to epinephrine autoinjector 2 pack in case of accidental ingestion.  Food allergy action plan is in place.   Meds ordered this encounter  Medications  . azelastine (ASTELIN) 0.1 % nasal spray    Sig: Use 1-2 sprays per nostril 2 times daily as needed    Dispense:  30 mL    Refill:  5  . beclomethasone (QVAR) 40 MCG/ACT inhaler    Sig: Inhale 2 puffs into the lungs 2 (two) times daily.    Dispense:  1 Inhaler    Refill:  3  . montelukast (SINGULAIR) 10 MG tablet    Sig: Take 1 tablet (10 mg total) by mouth at bedtime.    Dispense:  30 tablet    Refill:  5  . predniSONE (DELTASONE) tablet 10 mg    Diagnostics: Spirometry:  FVC was 3.05 L and FEV1 was 2.49 L (81% predicted) without significant postbronchodilator improvement..  Please see scanned spirometry results for details.    Physical examination: Blood pressure 122/80, pulse 92, resp. rate 20, height 5\' 5"  (1.651 m), weight 175 lb 12.8 oz (79.7 kg).  General: Alert, interactive, in no acute distress. HEENT: TMs pearly gray, turbinates edematous with thick discharge, post-pharynx erythematous. Neck: Supple without lymphadenopathy. Lungs: Clear to auscultation without  wheezing, rhonchi or rales. CV: Normal S1, S2 without murmurs. Skin: Warm and dry, without lesions or rashes.  The following portions of the patient's history were reviewed and updated as appropriate: allergies, current medications, past family history, past medical history, past social history, past surgical history and problem list.  Allergies as of 12/01/2016      Reactions   Sulfa Antibiotics Hives    Doxycycline Hyclate    Ivp Dye [iodinated Diagnostic Agents] Hives   Paxil [paroxetine Hcl] Other (See Comments)   Made pt very angry   Phosphorus Diarrhea      Medication List       Accurate as of 12/01/16  2:54 PM. Always use your most recent med list.          ALPRAZolam 0.25 MG tablet Commonly known as:  XANAX TAKE 1 TABLET BY MOUTH TWICE A DAY AS NEEDED FOR ANXIETY   azelastine 0.1 % nasal spray Commonly known as:  ASTELIN Use 1-2 sprays per nostril 2 times daily as needed   beclomethasone 40 MCG/ACT inhaler Commonly known as:  QVAR Inhale 2 puffs into the lungs 2 (two) times daily.   clidinium-chlordiazePOXIDE 5-2.5 MG capsule Commonly known as:  LIBRAX Take 1 capsule by mouth.   EPINEPHrine 0.3 mg/0.3 mL Soaj injection Commonly known as:  EPI-PEN Inject 0.3 mLs (0.3 mg total) into the muscle once.   [facility] 1/35 1-35 MG-MCG tablet Generic drug:  ethynodiol-ethinyl estradiol Take 1 tablet by mouth daily.   lactobacillus acidophilus Tabs tablet Take 2 tablets by mouth daily.   lidocaine-hydrocortisone 3-0.5 % Crea Commonly known as:  ANAMANTEL HC Place 1 Applicatorful rectally 2 (two) times daily.   magnesium oxide 400 MG tablet Commonly known as:  MAG-OX Take 400 mg by mouth daily.   montelukast 10 MG tablet Commonly known as:  SINGULAIR Take 1 tablet (10 mg total) by mouth at bedtime.   PROAIR HFA 108 (90 Base) MCG/ACT inhaler Generic drug:  albuterol Inhale 2 puffs into the lungs every 4 (four) hours as needed for wheezing or shortness of breath.   sertraline 100 MG tablet Commonly known as:  ZOLOFT Take 1 tablet (100 mg total) by mouth daily.   zolpidem 5 MG tablet Commonly known as:  AMBIEN Take 1 tablet (5 mg total) by mouth at bedtime as needed.       Allergies  Allergen Reactions  . Sulfa Antibiotics Hives  . Doxycycline Hyclate   . Ivp Dye [Iodinated Diagnostic Agents] Hives  . Paxil [Paroxetine Hcl] Other (See Comments)    Made pt  very angry   . Phosphorus Diarrhea   Review of systems: Review of systems negative except as noted in HPI / PMHx or noted below: Constitutional: Negative.  HENT: Negative.   Eyes: Negative.  Respiratory: Negative.   Cardiovascular: Negative.  Gastrointestinal: Negative.  Genitourinary: Negative.  Musculoskeletal: Negative.  Neurological: Negative.  Endo/Heme/Allergies: Negative.  Cutaneous: Negative.  Past Medical History:  Diagnosis Date  . Asthma   . GAD (generalized anxiety disorder)   . Vitamin D deficiency     Family History  Problem Relation Age of Onset  . Diabetes Mother   . Hypertension Mother   . Hyperlipidemia Father   . Hypertension Father     Social History   Social History  . Marital status: Married    Spouse name: N/A  . Number of children: N/A  . Years of education: N/A   Occupational History  . Not on file.  Social History Main Topics  . Smoking status: Never Smoker  . Smokeless tobacco: Not on file  . Alcohol use No  . Drug use: No  . Sexual activity: Not on file   Other Topics Concern  . Not on file   Social History Narrative  . No narrative on file   Review of systems: Review of systems negative except as noted in HPI / PMHx or noted below: Constitutional: Negative.  HENT: Negative.   Eyes: Negative.  Respiratory: Negative.   Cardiovascular: Negative.  Gastrointestinal: Negative.  Genitourinary: Negative.  Musculoskeletal: Negative.  Neurological: Negative.  Endo/Heme/Allergies: Negative.  Cutaneous: Negative.  Past Medical History:  Diagnosis Date  . Asthma   . GAD (generalized anxiety disorder)   . Vitamin D deficiency     Family History  Problem Relation Age of Onset  . Diabetes Mother   . Hypertension Mother   . Hyperlipidemia Father   . Hypertension Father     Social History   Social History  . Marital status: Married    Spouse name: N/A  . Number of children: N/A  . Years of education: N/A    Occupational History  . Not on file.   Social History Main Topics  . Smoking status: Never Smoker  . Smokeless tobacco: Not on file  . Alcohol use No  . Drug use: No  . Sexual activity: Not on file   Other Topics Concern  . Not on file   Social History Narrative  . No narrative on file    I appreciate the opportunity to take part in [place]'s care. Please do not hesitate to contact me with questions.  Sincerely,   R. Jorene Guestarter Lowell Mcgurk, MD

## 2016-12-04 ENCOUNTER — Other Ambulatory Visit: Payer: Self-pay

## 2016-12-04 DIAGNOSIS — T7840XA Allergy, unspecified, initial encounter: Secondary | ICD-10-CM

## 2016-12-04 DIAGNOSIS — L500 Allergic urticaria: Secondary | ICD-10-CM

## 2016-12-04 DIAGNOSIS — R21 Rash and other nonspecific skin eruption: Secondary | ICD-10-CM

## 2016-12-04 LAB — ALPHA-GAL PANEL
Alpha Gal IgE*: 9.14 kU/L — ABNORMAL HIGH (ref <0.35)
Beef (Bos spp) IgE: 6.26 kU/L — ABNORMAL HIGH (ref <0.35)
Class Interpretation: 2
Class Interpretation: 3
Class Interpretation: 3
Lamb/Mutton (Ovis spp) IgE: 2.03 kU/L — ABNORMAL HIGH (ref <0.35)
Pork (Sus spp) IgE: 4.35 kU/L — ABNORMAL HIGH (ref <0.35)

## 2016-12-04 MED ORDER — EPINEPHRINE 0.3 MG/0.3ML IJ SOAJ: 0.3000 mg | Freq: Once | INTRAMUSCULAR | 1 refills | Status: AC

## 2016-12-16 ENCOUNTER — Other Ambulatory Visit: Payer: Self-pay | Admitting: Nurse Practitioner

## 2016-12-16 DIAGNOSIS — F5101 Primary insomnia: Secondary | ICD-10-CM

## 2016-12-17 NOTE — Telephone Encounter (Signed)
Last filled 11/14/16, last seen 12/01/16. Call in

## 2017-01-21 ENCOUNTER — Other Ambulatory Visit: Payer: Self-pay | Admitting: Nurse Practitioner

## 2017-01-21 DIAGNOSIS — F411 Generalized anxiety disorder: Secondary | ICD-10-CM

## 2017-01-22 NOTE — Telephone Encounter (Signed)
Please call in xanax with 0 refills Last refill without being seen  

## 2017-01-22 NOTE — Telephone Encounter (Signed)
Phoned in.

## 2017-01-23 ENCOUNTER — Other Ambulatory Visit: Payer: Self-pay | Admitting: Nurse Practitioner

## 2017-01-23 DIAGNOSIS — F411 Generalized anxiety disorder: Secondary | ICD-10-CM

## 2017-01-26 NOTE — Telephone Encounter (Signed)
Please call in alprazolam with 0 refills Last refill without being seen  

## 2017-01-26 NOTE — Telephone Encounter (Signed)
OV 09/18/16-MMM Route to nurse for phone in if approved

## 2017-02-10 ENCOUNTER — Other Ambulatory Visit: Payer: Self-pay | Admitting: Nurse Practitioner

## 2017-02-10 DIAGNOSIS — F411 Generalized anxiety disorder: Secondary | ICD-10-CM

## 2017-02-16 ENCOUNTER — Other Ambulatory Visit: Payer: Self-pay | Admitting: Nurse Practitioner

## 2017-02-16 ENCOUNTER — Telehealth: Payer: Self-pay | Admitting: Nurse Practitioner

## 2017-02-16 DIAGNOSIS — F411 Generalized anxiety disorder: Secondary | ICD-10-CM

## 2017-02-16 DIAGNOSIS — F5101 Primary insomnia: Secondary | ICD-10-CM

## 2017-02-16 MED ORDER — SERTRALINE HCL 100 MG PO TABS: 100.0000 mg | ORAL_TABLET | Freq: Every day | ORAL | 5 refills | Status: DC

## 2017-02-16 NOTE — Telephone Encounter (Signed)
NTBS for ambien rx 

## 2017-02-16 NOTE — Telephone Encounter (Signed)
Patient aware and verbalizes understanding. 

## 2017-02-17 ENCOUNTER — Encounter: Payer: Self-pay | Admitting: Nurse Practitioner

## 2017-02-17 ENCOUNTER — Ambulatory Visit (INDEPENDENT_AMBULATORY_CARE_PROVIDER_SITE_OTHER): Payer: 59 | Admitting: Nurse Practitioner

## 2017-02-17 VITALS — BP 145/92 | HR 76 | Temp 97.0°F | Ht 65.0 in | Wt 183.0 lb

## 2017-02-17 DIAGNOSIS — F5101 Primary insomnia: Secondary | ICD-10-CM

## 2017-02-17 DIAGNOSIS — Z91018 Allergy to other foods: Secondary | ICD-10-CM | POA: Diagnosis not present

## 2017-02-17 DIAGNOSIS — Z6832 Body mass index (BMI) 32.0-32.9, adult: Secondary | ICD-10-CM

## 2017-02-17 DIAGNOSIS — E559 Vitamin D deficiency, unspecified: Secondary | ICD-10-CM

## 2017-02-17 DIAGNOSIS — J4531 Mild persistent asthma with (acute) exacerbation: Secondary | ICD-10-CM

## 2017-02-17 DIAGNOSIS — K580 Irritable bowel syndrome with diarrhea: Secondary | ICD-10-CM

## 2017-02-17 DIAGNOSIS — F411 Generalized anxiety disorder: Secondary | ICD-10-CM

## 2017-02-17 DIAGNOSIS — R030 Elevated blood-pressure reading, without diagnosis of hypertension: Secondary | ICD-10-CM

## 2017-02-17 MED ORDER — ALPRAZOLAM 0.25 MG PO TABS: 0.2500 mg | ORAL_TABLET | Freq: Two times a day (BID) | ORAL | 1 refills | Status: DC | PRN

## 2017-02-17 MED ORDER — MONTELUKAST SODIUM 10 MG PO TABS: 10.0000 mg | ORAL_TABLET | Freq: Every day | ORAL | 5 refills | Status: DC

## 2017-02-17 MED ORDER — SERTRALINE HCL 100 MG PO TABS: 100.0000 mg | ORAL_TABLET | Freq: Every day | ORAL | 5 refills | Status: DC

## 2017-02-17 MED ORDER — CILIDINIUM-CHLORDIAZEPOXIDE 2.5-5 MG PO CAPS: 1.0000 | ORAL_CAPSULE | Freq: Three times a day (TID) | ORAL | 5 refills | Status: DC

## 2017-02-17 MED ORDER — ZOLPIDEM TARTRATE 5 MG PO TABS: 5.0000 mg | ORAL_TABLET | Freq: Every evening | ORAL | 3 refills | Status: DC | PRN

## 2017-02-17 MED ORDER — BECLOMETHASONE DIPROPIONATE 40 MCG/ACT IN AERS: 2.0000 | INHALATION_SPRAY | Freq: Two times a day (BID) | RESPIRATORY_TRACT | 5 refills | Status: DC

## 2017-02-17 NOTE — Progress Notes (Signed)
 Subjective:    Patient ID: Christine [name], female    DOB: 02/28/1976, 40 y.o.   MRN: [id number]  HPI  Patient here today for follow up of chronic medical problems.  Outpatient Encounter Prescriptions as of 02/17/2017  Medication Sig  . albuterol (PROAIR HFA) 108 (90 Base) MCG/ACT inhaler Inhale 2 puffs into the lungs every 4 (four) hours as needed for wheezing or shortness of breath.  . ALPRAZolam (XANAX) 0.25 MG tablet TAKE 1 TABLET TWICE A DAY AS NEEDED  . azelastine (ASTELIN) 0.1 % nasal spray Use 1-2 sprays per nostril 2 times daily as needed  . beclomethasone (QVAR) 40 MCG/ACT inhaler Inhale 2 puffs into the lungs 2 (two) times daily.  . clidinium-chlordiazePOXIDE (LIBRAX) 5-2.5 MG capsule Take 1 capsule by mouth.  . ethynodiol-ethinyl estradiol (KELNOR 1/35) 1-35 MG-MCG tablet Take 1 tablet by mouth daily.  . lactobacillus acidophilus (BACID) TABS tablet Take 2 tablets by mouth daily.  . lidocaine-hydrocortisone (ANAMANTEL HC) 3-0.5 % CREA Place 1 Applicatorful rectally 2 (two) times daily. (Patient not taking: Reported on 12/01/2016)  . magnesium oxide (MAG-OX) 400 MG tablet Take 400 mg by mouth daily.  . montelukast (SINGULAIR) 10 MG tablet Take 1 tablet (10 mg total) by mouth at bedtime.  . sertraline (ZOLOFT) 100 MG tablet Take 1 tablet (100 mg total) by mouth daily.  . zolpidem (AMBIEN) 5 MG tablet Take 1 tablet (5 mg total) by mouth at bedtime as needed.   Facility-Administered Encounter Medications as of 02/17/2017  Medication  . predniSONE (DELTASONE) tablet 10 mg    GAD Is currently on zoloft 100mg daily- seems to be helping with her anxiety- deneis any side effects. GAD 7 : Generalized Anxiety Score 02/17/2017 09/18/2016 08/19/2016 08/08/2016  Nervous, Anxious, on Edge 1 0 3 3  Control/stop worrying 0 1 2 3  Worry too much - different things 1 0 3 3  Trouble relaxing 1 1 2 2  Restless 0 0 0 0  Easily annoyed or irritable 1 1 0 2  Afraid - awful might happen 0 0 2 3    Total GAD 7 Score 4 3 12 16  Anxiety Difficulty Not difficult at all Not difficult at all Not difficult at all Very difficult   Insomnia Patient reports she is getting better sleep at night and not needed to take the Ambien as often. Asthma Patient reports she only uses her Albuterol inhaler and no scheduled inhalers. Takes Singulair daily as well. She is using the Albuterol inhaler less often than before switching her anxiety medication.  Red Meat allergy Patient has not had any recent flare ups but avoids all red meats. White coat syndrome Blood pressure is always elevated at doctors office but patient says when she checks it at home it is less than 120 systolic Vitamin d def Currently not taking any vitamin d IBS Mainly loose stools-takes librax as needed- helps some but most stools are loose    Review of Systems  Constitutional: Positive for appetite change and unexpected weight change.       Prior to changing anxiety medication last month the patient had lost ~30 lbs. Appetite coming back now.  HENT: Negative.   Eyes: Negative.   Respiratory: Positive for chest tightness and shortness of breath.        First thing in the morning prior to medications.  Cardiovascular: Negative.   Gastrointestinal: Positive for constipation.  Endocrine: Negative.   Genitourinary: Negative.   Musculoskeletal: Negative.   Skin:   Negative.   Allergic/Immunologic: Positive for environmental allergies and food allergies.  Neurological: Negative.   Hematological: Negative.   Psychiatric/Behavioral: Positive for sleep disturbance. Negative for suicidal ideas. The patient is nervous/anxious.       Objective:   Physical Exam  Constitutional: She is oriented to person, place, and time. She appears well-developed and well-nourished.  HENT:  Head: Normocephalic.  Right Ear: External ear normal.  Left Ear: External ear normal.  Mouth/Throat: Oropharynx is clear and moist.  Eyes: Conjunctivae and  EOM are normal. Pupils are equal, round, and reactive to light.  Neck: Normal range of motion. Neck supple. No thyromegaly present.  Cardiovascular: Normal rate, regular rhythm and normal heart sounds.   Pulmonary/Chest: Effort normal and breath sounds normal.  Abdominal: Soft. Bowel sounds are normal.  Musculoskeletal: Normal range of motion.  Lymphadenopathy:    She has no cervical adenopathy.  Neurological: She is alert and oriented to person, place, and time.  Skin: Skin is warm and dry.  Psychiatric: She has a normal mood and affect. Her behavior is normal. Judgment and thought content normal.   BP (!) 145/92   Pulse 76   Temp 97 F (36.1 C) (Oral)   Ht 5' 5" (1.651 m)   Wt 183 lb (83 kg)   BMI 30.45 kg/m       Assessment & Plan:  1. White coat syndrome with high blood pressure without hypertension Avoid decongestants Low sodium diet Keep check o fbloodpressure at home - CMP14+EGFR - Lipid panel  2. Mild persistent asthma with acute exacerbation Avoid allergens - beclomethasone (QVAR) 40 MCG/ACT inhaler; Inhale 2 puffs into the lungs 2 (two) times daily.  Dispense: 1 Inhaler; Refill: 5 - montelukast (SINGULAIR) 10 MG tablet; Take 1 tablet (10 mg total) by mouth at bedtime.  Dispense: 30 tablet; Refill: 5  3. Allergy to meat Avoid red meat  4. BMI 32.0-32.9,adult Discussed diet and exercise for person with BMI >25 Will recheck weight in 3-6 months  5. Generalized anxiety disorder Stress management - ALPRAZolam (XANAX) 0.25 MG tablet; Take 1 tablet (0.25 mg total) by mouth 2 (two) times daily as needed.  Dispense: 30 tablet; Refill: 1 - sertraline (ZOLOFT) 100 MG tablet; Take 1 tablet (100 mg total) by mouth daily.  Dispense: 30 tablet; Refill: 5  6. Vitamin D deficiency Labs pending - VITAMIN D 25 Hydroxy (Vit-D Deficiency, Fractures)  7. Primary insomnia Bedtime routine - zolpidem (AMBIEN) 5 MG tablet; Take 1 tablet (5 mg total) by mouth at bedtime as  needed.  Dispense: 30 tablet; Refill: 3  8. Irritable bowel syndrome with diarrhea Continue librax as rx    Labs pending Health maintenance reviewed Diet and exercise encouraged Continue all meds Follow up  In 6 months   [place], FNP

## 2017-02-17 NOTE — Patient Instructions (Signed)
Stress and Stress Management Stress is a normal reaction to life events. It is what you feel when life demands more than you are used to or more than you can handle. Some stress can be useful. For example, the stress reaction can help you catch the last bus of the day, study for a test, or meet a deadline at work. But stress that occurs too often or for too long can cause problems. It can affect your emotional health and interfere with relationships and normal daily activities. Too much stress can weaken your immune system and increase your risk for physical illness. If you already have a medical problem, stress can make it worse. What are the causes? All sorts of life events may cause stress. An event that causes stress for one person may not be stressful for another person. Major life events commonly cause stress. These may be positive or negative. Examples include losing your job, moving into a new home, getting married, having a baby, or losing a loved one. Less obvious life events may also cause stress, especially if they occur day after day or in combination. Examples include working long hours, driving in traffic, caring for children, being in debt, or being in a difficult relationship. What are the signs or symptoms? Stress may cause emotional symptoms including, the following:  Anxiety. This is feeling worried, afraid, on edge, overwhelmed, or out of control.  Anger. This is feeling irritated or impatient.  Depression. This is feeling sad, down, helpless, or guilty.  Difficulty focusing, remembering, or making decisions. Stress may cause physical symptoms, including the following:  Aches and pains. These may affect your head, neck, back, stomach, or other areas of your body.  Tight muscles or clenched jaw.  Low energy or trouble sleeping. Stress may cause unhealthy behaviors, including the following:  Eating to feel better (overeating) or skipping meals.  Sleeping too little, too  much, or both.  Working too much or putting off tasks (procrastination).  Smoking, drinking alcohol, or using drugs to feel better. How is this diagnosed? Stress is diagnosed through an assessment by your health care provider. Your health care provider will ask questions about your symptoms and any stressful life events.Your health care provider will also ask about your medical history and may order blood tests or other tests. Certain medical conditions and medicine can cause physical symptoms similar to stress. Mental illness can cause emotional symptoms and unhealthy behaviors similar to stress. Your health care provider may refer you to a mental health professional for further evaluation. How is this treated? Stress management is the recommended treatment for stress.The goals of stress management are reducing stressful life events and coping with stress in healthy ways. Techniques for reducing stressful life events include the following:  Stress identification. Self-monitor for stress and identify what causes stress for you. These skills may help you to avoid some stressful events.  Time management. Set your priorities, keep a calendar of events, and learn to say "no." These tools can help you avoid making too many commitments. Techniques for coping with stress include the following:  Rethinking the problem. Try to think realistically about stressful events rather than ignoring them or overreacting. Try to find the positives in a stressful situation rather than focusing on the negatives.  Exercise. Physical exercise can release both physical and emotional tension. The key is to find a form of exercise you enjoy and do it regularly.  Relaxation techniques. These relax the body and mind. Examples include yoga,  meditation, tai chi, biofeedback, deep breathing, progressive muscle relaxation, listening to music, being out in nature, journaling, and other hobbies. Again, the key is to find one or  more that you enjoy and can do regularly.  Healthy lifestyle. Eat a balanced diet, get plenty of sleep, and do not smoke. Avoid using alcohol or drugs to relax.  Strong support network. Spend time with family, friends, or other people you enjoy being around.Express your feelings and talk things over with someone you trust. Counseling or talktherapy with a mental health professional may be helpful if you are having difficulty managing stress on your own. Medicine is typically not recommended for the treatment of stress.Talk to your health care provider if you think you need medicine for symptoms of stress. Follow these instructions at home:  Keep all follow-up visits as directed by your health care provider.  Take all medicines as directed by your health care provider. Contact a health care provider if:  Your symptoms get worse or you start having new symptoms.  You feel overwhelmed by your problems and can no longer manage them on your own. Get help right away if:  You feel like hurting yourself or someone else. This information is not intended to replace advice given to you by your health care provider. Make sure you discuss any questions you have with your health care provider. Document Released: 05/06/2001 Document Revised: 04/17/2016 Document Reviewed: 07/05/2013 Elsevier Interactive Patient Education  2017 [employer].

## 2017-02-18 LAB — CMP14+EGFR
ALT: 22 IU/L (ref 0–32)
AST: 20 IU/L (ref 0–40)
Albumin/Globulin Ratio: 2 (ref 1.2–2.2)
Albumin: 4.5 g/dL (ref 3.5–5.5)
Alkaline Phosphatase: 89 IU/L (ref 39–117)
BUN/Creatinine Ratio: 16 (ref 9–23)
BUN: 11 mg/dL (ref 6–24)
Bilirubin Total: 0.5 mg/dL (ref 0.0–1.2)
CALCIUM: 9.8 mg/dL (ref 8.7–10.2)
CO2: 22 mmol/L (ref 18–29)
CREATININE: 0.67 mg/dL (ref 0.57–1.00)
Chloride: 98 mmol/L (ref 96–106)
GFR calc Af Amer: 125 mL/min/{1.73_m2} (ref >59)
GFR, EST NON AFRICAN AMERICAN: 109 mL/min/{1.73_m2} (ref >59)
GLOBULIN, TOTAL: 2.3 g/dL (ref 1.5–4.5)
Glucose: 82 mg/dL (ref 65–99)
Potassium: 4.3 mmol/L (ref 3.5–5.2)
SODIUM: 138 mmol/L (ref 134–144)
Total Protein: 6.8 g/dL (ref 6.0–8.5)

## 2017-02-18 LAB — VITAMIN D 25 HYDROXY (VIT D DEFICIENCY, FRACTURES): Vit D, 25-Hydroxy: 53.1 ng/mL (ref 30.0–100.0)

## 2017-02-18 LAB — LIPID PANEL
CHOL/HDL RATIO: 3.5 ratio (ref 0.0–4.4)
Cholesterol, Total: 281 mg/dL — ABNORMAL HIGH (ref 100–199)
HDL: 81 mg/dL (ref >39)
LDL CALC: 185 mg/dL — AB (ref 0–99)
TRIGLYCERIDES: 74 mg/dL (ref 0–149)
VLDL Cholesterol Cal: 15 mg/dL (ref 5–40)

## 2017-06-15 ENCOUNTER — Other Ambulatory Visit: Payer: Self-pay | Admitting: Nurse Practitioner

## 2017-06-15 ENCOUNTER — Telehealth: Payer: Self-pay | Admitting: Nurse Practitioner

## 2017-06-15 DIAGNOSIS — F5101 Primary insomnia: Secondary | ICD-10-CM

## 2017-06-15 MED ORDER — ZOLPIDEM TARTRATE 5 MG PO TABS: 5.0000 mg | ORAL_TABLET | Freq: Every evening | ORAL | 1 refills | Status: DC | PRN

## 2017-06-15 NOTE — Telephone Encounter (Signed)
What is the name of the medication? Ambien. Going [place] of [address]. Must get today  Have you contacted your pharmacy to request a refill? yes  Which pharmacy would you like this sent to? [facility] [place].   Patient notified that their request is being sent to the clinical staff for review and that they should receive a call once it is complete. If they do not receive a call within 24 hours they can check with their pharmacy or our office.

## 2017-06-15 NOTE — Telephone Encounter (Signed)
[facility] [facility] [place] patient aware

## 2017-06-15 NOTE — Telephone Encounter (Signed)
[facility]

## 2017-06-16 ENCOUNTER — Other Ambulatory Visit: Payer: Self-pay | Admitting: Nurse Practitioner

## 2017-06-16 DIAGNOSIS — F5101 Primary insomnia: Secondary | ICD-10-CM

## 2017-06-23 ENCOUNTER — Encounter: Payer: Self-pay | Admitting: Allergy and Immunology

## 2017-06-23 ENCOUNTER — Ambulatory Visit (INDEPENDENT_AMBULATORY_CARE_PROVIDER_SITE_OTHER): Payer: 59 | Admitting: Allergy and Immunology

## 2017-06-23 VITALS — BP 130/70 | HR 94 | Temp 98.7°F | Resp 16

## 2017-06-23 DIAGNOSIS — J3089 Other allergic rhinitis: Secondary | ICD-10-CM

## 2017-06-23 DIAGNOSIS — T7800XD Anaphylactic reaction due to unspecified food, subsequent encounter: Secondary | ICD-10-CM | POA: Diagnosis not present

## 2017-06-23 DIAGNOSIS — J45901 Unspecified asthma with (acute) exacerbation: Secondary | ICD-10-CM | POA: Diagnosis not present

## 2017-06-23 DIAGNOSIS — R0609 Other forms of dyspnea: Secondary | ICD-10-CM | POA: Diagnosis not present

## 2017-06-23 DIAGNOSIS — R0600 Dyspnea, unspecified: Secondary | ICD-10-CM | POA: Insufficient documentation

## 2017-06-23 MED ORDER — FLUTICASONE PROPIONATE HFA 110 MCG/ACT IN AERO: 2.0000 | INHALATION_SPRAY | Freq: Two times a day (BID) | RESPIRATORY_TRACT | 3 refills | Status: DC

## 2017-06-23 NOTE — Patient Instructions (Addendum)
Asthma with acute exacerbation  During respiratory tract infections or asthma flares, add Flovent 110g 3 inhalations 3 times per day until symptoms have returned to baseline.  If symptoms persist or progress, may start prednisone, 20 mg x 4 days, 10 mg x1 day, then stop.  Continue montelukast 10 mg daily bedtime and albuterol HFA, 1-2 inhalations every 4-6 hours as needed.  Subjective and objective measures of pulmonary function will be followed and the treatment plan will be adjusted accordingly.  Dyspnea The patient's sensation of air-hunger, not being able to get a full breath on inspiration, which is relieved by a yawn suggests sighing dyspnea.  This is superimposed upon her underlying asthma.   Diaphragmatic breathing, or belly breathing, has been discussed with the patient as this technique often times relieves sighing dyspnea.  For now, continue treatment plan as outlined above for asthma.  Allergic rhinitis Stable.  Continue appropriate allergen avoidance measures, montelukast 10 mg daily at bedtime, and fluticasone nasal spray as needed.  Alpha gal hypersensitivity  Continue careful avoidance of mammalian meat and have access to epinephrine autoinjector 2 pack in case of accidental ingestion.   Return in about 5 months (around 11/23/2017), or if symptoms worsen or fail to improve.

## 2017-06-23 NOTE — Assessment & Plan Note (Signed)
The patient's sensation of air-hunger, not being able to get a full breath on inspiration, which is relieved by a yawn suggests sighing dyspnea.  This is superimposed upon her underlying asthma.   Diaphragmatic breathing, or belly breathing, has been discussed with the patient as this technique often times relieves sighing dyspnea.  For now, continue treatment plan as outlined above for asthma.

## 2017-06-23 NOTE — Assessment & Plan Note (Signed)
   During respiratory tract infections or asthma flares, add Flovent 110g 3 inhalations 3 times per day until symptoms have returned to baseline.  If symptoms persist or progress, may start prednisone, 20 mg x 4 days, 10 mg x1 day, then stop.  Continue montelukast 10 mg daily bedtime and albuterol HFA, 1-2 inhalations every 4-6 hours as needed.  Subjective and objective measures of pulmonary function will be followed and the treatment plan will be adjusted accordingly.

## 2017-06-23 NOTE — Progress Notes (Signed)
Follow-up Note  RE: Christine MontaneJenny H [name] MRN: [id number] DOB: 02/26/1976 Date of Office Visit: 06/23/2017  Primary care provider: Bennie [name], Mary-Margaret, FNP Referring provider: Bennie [name], [name], *  History of present illness: Christine PhilipsJenny [name] is a 41 y.o. female with persistent asthma, allergic rhinitis, and alpha gal hypersensitivity presents today for sick visit.  She states that over the past few days she has been experiencing chest tightness and dyspnea.  The symptoms started on Sunday morning and were accompanied by a productive cough.  She has not been experiencing fevers, chills, or discolored mucus production.  She took albuterol rescue on Sunday morning with benefit area and however, over the past 2 mornings she has awakened with chest tightness and difficulty "getting a full breath in."  She notes that over the past couple days she has been experiencing increased anxiety and she is uncertain if her asthma is triggering the anxiety or if the anxiety is triggering the asthma.  This morning she did not experience wheezing or difficulty with exhalation.  She is currently taking montelukast and will add Qvar during asthma flares.  On Sunday evening, she started taking Qvar 40 g, 2 inhalations via spacer device once daily.  Her nasal allergy symptoms are currently well controlled.  She avoids mammalian meat and has access to epinephrine autoinjectors in case of accidental ingestion followed by systemic symptoms.   Assessment and plan: Asthma with acute exacerbation  During respiratory tract infections or asthma flares, add Flovent 110g 3 inhalations 3 times per day until symptoms have returned to baseline.  If symptoms persist or progress, may start prednisone, 20 mg x 4 days, 10 mg x1 day, then stop.  Continue montelukast 10 mg daily bedtime and albuterol HFA, 1-2 inhalations every 4-6 hours as needed.  Subjective and objective measures of pulmonary function will be followed and the  treatment plan will be adjusted accordingly.  Dyspnea The patient's sensation of air-hunger, not being able to get a full breath on inspiration, which is relieved by a yawn suggests sighing dyspnea.  This is superimposed upon her underlying asthma.   Diaphragmatic breathing, or belly breathing, has been discussed with the patient as this technique often times relieves sighing dyspnea.  For now, continue treatment plan as outlined above for asthma.  Allergic rhinitis Stable.  Continue appropriate allergen avoidance measures, montelukast 10 mg daily at bedtime, and fluticasone nasal spray as needed.  Alpha gal hypersensitivity  Continue careful avoidance of mammalian meat and have access to epinephrine autoinjector 2 pack in case of accidental ingestion.   Meds ordered this encounter  Medications  . fluticasone (FLOVENT HFA) 110 MCG/ACT inhaler    Sig: Inhale 2 puffs into the lungs 2 (two) times daily.    Dispense:  2 Inhaler    Refill:  3    Diagnostics: Spirometry reveals an FVC of 3.34 L (88% predicted) and an FEV1 of 2.40 L (79% predicted) with 190 mL postbronchodilator improvement.  Please see scanned spirometry results for details.    Physical examination: Blood pressure 130/70, pulse 94, temperature 98.7 F (37.1 C), temperature source Oral, resp. rate 16, SpO2 97 %.  General: Alert, interactive, in no acute distress. HEENT: TMs pearly gray, turbinates mildly edematous without discharge, post-pharynx unremarkable. Neck: Supple without lymphadenopathy. Lungs: Clear to auscultation without wheezing, rhonchi or rales. CV: Normal S1, S2 without murmurs. Skin: Warm and dry, without lesions or rashes.  The following portions of the patient's history were reviewed and updated as appropriate: allergies, current medications,  past family history, past medical history, past social history, past surgical history and problem list.  Allergies as of 06/23/2017      Reactions    Sulfa Antibiotics Hives   Doxycycline Hyclate    Ivp Dye [iodinated Diagnostic Agents] Hives   Paxil [paroxetine Hcl] Other (See Comments)   Made pt very angry   Phosphorus Diarrhea      Medication List       Accurate as of 06/23/17  1:55 PM. Always use your most recent med list.          ALPRAZolam 0.25 MG tablet Commonly known as:  XANAX Take 1 tablet (0.25 mg total) by mouth 2 (two) times daily as needed.   azelastine 0.1 % nasal spray Commonly known as:  ASTELIN Use 1-2 sprays per nostril 2 times daily as needed   beclomethasone 40 MCG/ACT inhaler Commonly known as:  QVAR Inhale 2 puffs into the lungs 2 (two) times daily.   clidinium-chlordiazePOXIDE 5-2.5 MG capsule Commonly known as:  LIBRAX Take 1 capsule by mouth 3 (three) times daily before meals.   fluticasone 110 MCG/ACT inhaler Commonly known as:  FLOVENT HFA Inhale 2 puffs into the lungs 2 (two) times daily.   [facility] 1/35 1-35 MG-MCG tablet Generic drug:  ethynodiol-ethinyl estradiol Take 1 tablet by mouth daily.   magnesium oxide 400 MG tablet Commonly known as:  MAG-OX Take 400 mg by mouth daily.   montelukast 10 MG tablet Commonly known as:  SINGULAIR Take 1 tablet (10 mg total) by mouth at bedtime.   PROAIR HFA 108 (90 Base) MCG/ACT inhaler Generic drug:  albuterol Inhale 2 puffs into the lungs every 4 (four) hours as needed for wheezing or shortness of breath.   sertraline 100 MG tablet Commonly known as:  ZOLOFT Take 1 tablet (100 mg total) by mouth daily.   Vitamin D3 1000 units Caps Take by mouth.   zolpidem 5 MG tablet Commonly known as:  AMBIEN Take 1 tablet (5 mg total) by mouth at bedtime as needed.       Allergies  Allergen Reactions  . Sulfa Antibiotics Hives  . Doxycycline Hyclate   . Ivp Dye [Iodinated Diagnostic Agents] Hives  . Paxil [Paroxetine Hcl] Other (See Comments)    Made pt very angry   . Phosphorus Diarrhea   Review of systems: Review of systems  negative except as noted in HPI / PMHx or noted below: Constitutional: Negative.  HENT: Negative.   Eyes: Negative.  Respiratory: Negative.   Cardiovascular: Negative.  Gastrointestinal: Negative.  Genitourinary: Negative.  Musculoskeletal: Negative.  Neurological: Negative.  Endo/Heme/Allergies: Negative.  Cutaneous: Negative.  Past Medical History:  Diagnosis Date  . Asthma   . GAD (generalized anxiety disorder)   . Vitamin D deficiency     Family History  Problem Relation Age of Onset  . Diabetes Mother   . Hypertension Mother   . Hyperlipidemia Father   . Hypertension Father     Social History   Social History  . Marital status: Married    Spouse name: N/A  . Number of children: N/A  . Years of education: N/A   Occupational History  . Not on file.   Social History Main Topics  . Smoking status: Never Smoker  . Smokeless tobacco: Never Used  . Alcohol use No  . Drug use: No  . Sexual activity: Not on file   Other Topics Concern  . Not on file   Social History Narrative  .  No narrative on file    I appreciate the opportunity to take part in [facility] care. Please do not hesitate to contact me with questions.  Sincerely,   R. Jorene Guestarter Kirsten Spearing, MD

## 2017-06-23 NOTE — Assessment & Plan Note (Signed)
   Continue careful avoidance of mammalian meat and have access to epinephrine autoinjector 2 pack in case of accidental ingestion. 

## 2017-06-23 NOTE — Assessment & Plan Note (Signed)
Stable.  Continue appropriate allergen avoidance measures, montelukast 10 mg daily at bedtime, and fluticasone nasal spray as needed.

## 2017-06-29 ENCOUNTER — Other Ambulatory Visit: Payer: Self-pay | Admitting: Allergy and Immunology

## 2017-06-29 MED ORDER — ALBUTEROL SULFATE HFA 108 (90 BASE) MCG/ACT IN AERS: 2.0000 | INHALATION_SPRAY | Freq: Four times a day (QID) | RESPIRATORY_TRACT | 5 refills | Status: DC | PRN

## 2017-06-29 NOTE — Telephone Encounter (Signed)
Spoke to patient and sent in albuterol to the pharmacy.

## 2017-06-29 NOTE — Telephone Encounter (Signed)
Patient called looking for a sample of ProAir HFA and was told by someone that isn't made anymore. She called [facility] to see if they had a sample, they did not. She would [facility] [place] [facility] she says will work and not be too expensive.

## 2017-08-11 ENCOUNTER — Other Ambulatory Visit: Payer: Self-pay | Admitting: Nurse Practitioner

## 2017-08-11 DIAGNOSIS — F411 Generalized anxiety disorder: Secondary | ICD-10-CM

## 2017-08-11 NOTE — Telephone Encounter (Signed)
Last seen 02/17/17  MMM 

## 2017-08-12 NOTE — Telephone Encounter (Signed)
Last seen 02/17/17  MMM  If approved route to [facility]

## 2017-08-13 ENCOUNTER — Other Ambulatory Visit: Payer: Self-pay | Admitting: Nurse Practitioner

## 2017-08-13 NOTE — Telephone Encounter (Signed)
Patient aware that Rx has been called to pharmacy

## 2017-08-13 NOTE — Telephone Encounter (Signed)
Phoned in.

## 2017-08-13 NOTE — Telephone Encounter (Signed)
Please call in zolpidem with 0 refills 

## 2017-09-08 ENCOUNTER — Other Ambulatory Visit: Payer: Self-pay | Admitting: Nurse Practitioner

## 2017-09-08 DIAGNOSIS — F411 Generalized anxiety disorder: Secondary | ICD-10-CM

## 2017-09-09 NOTE — Telephone Encounter (Signed)
Please call in ambien with 0 refills Last refill without being seen  

## 2017-09-10 ENCOUNTER — Other Ambulatory Visit: Payer: Self-pay | Admitting: Nurse Practitioner

## 2017-09-10 DIAGNOSIS — F411 Generalized anxiety disorder: Secondary | ICD-10-CM

## 2017-09-10 NOTE — Telephone Encounter (Signed)
Rx called into pharmacy. Pt aware and scheduled to see MMM 11/12 at 4:15.

## 2017-09-15 ENCOUNTER — Ambulatory Visit (INDEPENDENT_AMBULATORY_CARE_PROVIDER_SITE_OTHER): Payer: 59 | Admitting: Nurse Practitioner

## 2017-09-15 ENCOUNTER — Encounter: Payer: Self-pay | Admitting: Nurse Practitioner

## 2017-09-15 VITALS — BP 159/93 | HR 76 | Temp 97.8°F | Ht 65.0 in | Wt 197.6 lb

## 2017-09-15 DIAGNOSIS — F5101 Primary insomnia: Secondary | ICD-10-CM

## 2017-09-15 DIAGNOSIS — B370 Candidal stomatitis: Secondary | ICD-10-CM

## 2017-09-15 DIAGNOSIS — F411 Generalized anxiety disorder: Secondary | ICD-10-CM | POA: Diagnosis not present

## 2017-09-15 DIAGNOSIS — Z23 Encounter for immunization: Secondary | ICD-10-CM

## 2017-09-15 MED ORDER — ZOLPIDEM TARTRATE 5 MG PO TABS: 5.0000 mg | ORAL_TABLET | Freq: Every evening | ORAL | 1 refills | Status: DC | PRN

## 2017-09-15 MED ORDER — NYSTATIN 100000 UNIT/ML MT SUSP: 5.0000 mL | Freq: Four times a day (QID) | OROMUCOSAL | 0 refills | Status: DC

## 2017-09-15 MED ORDER — SERTRALINE HCL 100 MG PO TABS: 100.0000 mg | ORAL_TABLET | Freq: Every day | ORAL | 5 refills | Status: DC

## 2017-09-15 NOTE — Patient Instructions (Signed)
Oral Thrush, Adult Oral thrush is an infection in your mouth and throat. It causes white patches on your tongue and in your mouth. Follow these instructions at home: Helping with soreness  To lessen your pain: ? Drink cold liquids, like water and iced tea. ? Eat frozen ice pops or frozen juices. ? Eat foods that are easy to swallow, like gelatin and ice cream. ? Drink from a straw if the patches in your mouth are painful. General instructions   Take or use over-the-counter and prescription medicines only as told by your doctor. Medicine for oral thrush may be something to swallow, or it may be something to put on the infected area.  Eat plain yogurt that has live cultures in it. Read the label to make sure.  If you wear dentures: ? Take out your dentures before you go to bed. ? Brush them well. ? Soak them in a denture cleaner.  Rinse your mouth with warm salt-water many times a day. To make the salt-water mixture, completely dissolve 1/2-1 teaspoon of salt in 1 cup of warm water. Contact a doctor if:  Your problems are getting worse.  Your problems do not get better in less than 7 days with treatment.  Your infection is spreading. This may show as white patches on the skin outside of your mouth.  You are nursing your baby and you have redness and pain in the nipples. This information is not intended to replace advice given to you by your health care provider. Make sure you discuss any questions you have with your health care provider. Document Released: 02/04/2010 Document Revised: 08/04/2016 Document Reviewed: 08/04/2016 Elsevier Interactive Patient Education  2017 Elsevier Inc.  

## 2017-09-15 NOTE — Addendum Note (Signed)
Addended by: Bennie PieriniMARTIN, MARY-MARGARET on: 09/15/2017 04:16 PM   Modules accepted: Orders

## 2017-09-15 NOTE — Progress Notes (Signed)
   Subjective:    Patient ID: Christine [name], female    DOB: 03/10/1976, 41 y.o.   MRN: [id number]  HPI Patient in today c/io thrush. She was on a steroid inhaler about 1 1/2 weeks ago for a cough. She rinsed her mouth out after use but says that she thinks she developed it anyway. She has a white film on cheeks and tongue. Says that tongue feels raw.    Review of Systems  Constitutional: Negative.   HENT: Negative.   Respiratory: Negative.   Cardiovascular: Negative.   Neurological: Negative.   Psychiatric/Behavioral: Negative.   All other systems reviewed and are negative.      Objective:   Physical Exam  Constitutional: She is oriented to person, place, and time. She appears well-developed and well-nourished. No distress.  HENT:  Mouth/Throat: Oropharynx is clear and moist.  White film on buccal mucosa and tongue  Eyes: Pupils are equal, round, and reactive to light. Conjunctivae are normal.  Neck: Normal range of motion. Neck supple.  Cardiovascular: Normal rate and regular rhythm.   Pulmonary/Chest: Effort normal and breath sounds normal.  Neurological: She is alert and oriented to person, place, and time.  Skin: Skin is warm.  Psychiatric: She has a normal mood and affect. Her behavior is normal. Judgment and thought content normal.   BP (!) 159/93   Pulse 76   Temp 97.8 F (36.6 C) (Oral)   Ht 5\' 5"  (1.651 m)   Wt 197 lb 9.6 oz (89.6 kg)   BMI 32.88 kg/m      Assessment & Plan:   1. Oral candidiasis   2. Need for immunization against influenza    Meds ordered this encounter  Medications  . nystatin (MYCOSTATIN) 100000 UNIT/ML suspension    Sig: Take 5 mLs (500,000 Units total) by mouth 4 (four) times daily.    Dispense:  60 mL    Refill:  0    Order Specific Question:   Supervising Provider    Answer:   Johna SheriffVINCENT, CAROL L [[id number]]   rto prn  Mary-Margaret Daphine DeutscherMartin, FNP

## 2017-09-17 ENCOUNTER — Telehealth: Payer: Self-pay | Admitting: Nurse Practitioner

## 2017-09-17 DIAGNOSIS — B370 Candidal stomatitis: Secondary | ICD-10-CM

## 2017-09-17 MED ORDER — NYSTATIN 100000 UNIT/ML MT SUSP: 5.0000 mL | Freq: Four times a day (QID) | OROMUCOSAL | 0 refills | Status: DC

## 2017-09-17 NOTE — Telephone Encounter (Signed)
refilled nystatin

## 2017-09-18 ENCOUNTER — Other Ambulatory Visit: Payer: Self-pay | Admitting: Nurse Practitioner

## 2017-09-18 DIAGNOSIS — B370 Candidal stomatitis: Secondary | ICD-10-CM

## 2017-09-18 MED ORDER — NYSTATIN 100000 UNIT/ML MT SUSP: 5.0000 mL | Freq: Four times a day (QID) | OROMUCOSAL | 0 refills | Status: DC

## 2017-09-18 NOTE — Telephone Encounter (Signed)
Was done yesterday

## 2017-09-21 ENCOUNTER — Telehealth: Payer: Self-pay | Admitting: Nurse Practitioner

## 2017-09-22 NOTE — Telephone Encounter (Signed)
Should not be causing throat pain. Just stop meds- drinks lots of fluids and if no better call me thursday

## 2017-09-23 NOTE — Telephone Encounter (Signed)
Pt is aware and voiced understanding

## 2017-09-24 ENCOUNTER — Ambulatory Visit (INDEPENDENT_AMBULATORY_CARE_PROVIDER_SITE_OTHER): Payer: 59 | Admitting: Nurse Practitioner

## 2017-09-24 ENCOUNTER — Telehealth: Payer: Self-pay | Admitting: Nurse Practitioner

## 2017-09-24 ENCOUNTER — Encounter: Payer: Self-pay | Admitting: Nurse Practitioner

## 2017-09-24 VITALS — BP 148/84 | HR 72 | Temp 97.5°F | Ht 65.0 in | Wt 193.0 lb

## 2017-09-24 DIAGNOSIS — R070 Pain in throat: Secondary | ICD-10-CM

## 2017-09-24 NOTE — Progress Notes (Addendum)
   Subjective:    Patient ID: Christine MontaneJenny H Bourquin, female    DOB: 10/10/1976, 41 y.o.   MRN: [id number]  HPI Patient was seen on 09/21/17 with thrush  In her mouth and post oral pharynx. She was given nystatin. She called in several days later for refill. She come in today stating that her throat hurts. But says it is down her neck not straight back in mouth. Has just been bothering her since she finished the nystatin. She says when she swallows it feels like their is phlegm and is "just sore". " like the muscles are sore.". Neck feels tired when she gets home.   Review of Systems  Constitutional: Positive for appetite change (afraid to eat much because sme things irritate throat).  HENT: Negative.   Respiratory: Negative.   Cardiovascular: Negative.   Gastrointestinal: Negative.   Genitourinary: Negative.   Neurological: Negative.   Psychiatric/Behavioral: Negative.   All other systems reviewed and are negative.      Objective:   Physical Exam  Constitutional: She is oriented to person, place, and time. She appears well-developed and well-nourished. No distress.  HENT:  Right Ear: External ear normal.  Left Ear: External ear normal.  Mouth/Throat: Oropharynx is clear and moist.  Eyes: Pupils are equal, round, and reactive to light.  Cardiovascular: Normal rate and regular rhythm.   Pulmonary/Chest: Effort normal and breath sounds normal.  Neurological: She is alert and oriented to person, place, and time. She has normal reflexes.  Skin: Skin is warm.  Psychiatric: She has a normal mood and affect. Her behavior is normal. Judgment and thought content normal.    BP (!) 148/84   Pulse 72   Temp (!) 97.5 F (36.4 C) (Oral)   Ht 5\' 5"  (1.651 m)   Wt 193 lb (87.5 kg)   BMI 32.12 kg/m        Assessment & Plan:   1. Throat pain in adult    Orders Placed This Encounter  Procedures  . Ambulatory referral to ENT    Referral Priority:   Routine    Referral Type:   Consultation      Referral Reason:   Specialty Services Required    Referred to Provider:   Suzanna ObeyByers, John, MD    Requested Specialty:   Otolaryngology    Number of Visits Requested:   1   otc decongestant Force fluids Avoid acidic foods\ Try omeprazole OTC to see if could be GERD causing problem. RTO prn  Mary-Margaret Daphine DeutscherMartin, FNP

## 2017-09-24 NOTE — Telephone Encounter (Signed)
Patient was seen in office

## 2017-09-25 DIAGNOSIS — M26629 Arthralgia of temporomandibular joint, unspecified side: Secondary | ICD-10-CM | POA: Insufficient documentation

## 2017-09-25 DIAGNOSIS — J45909 Unspecified asthma, uncomplicated: Secondary | ICD-10-CM | POA: Diagnosis not present

## 2017-09-25 DIAGNOSIS — M2669 Other specified disorders of temporomandibular joint: Secondary | ICD-10-CM | POA: Diagnosis not present

## 2017-09-25 DIAGNOSIS — K219 Gastro-esophageal reflux disease without esophagitis: Secondary | ICD-10-CM | POA: Diagnosis not present

## 2017-09-25 DIAGNOSIS — F419 Anxiety disorder, unspecified: Secondary | ICD-10-CM | POA: Insufficient documentation

## 2017-10-05 ENCOUNTER — Ambulatory Visit: Payer: 59 | Admitting: Nurse Practitioner

## 2017-10-06 ENCOUNTER — Telehealth: Payer: Self-pay | Admitting: Nurse Practitioner

## 2017-10-06 ENCOUNTER — Other Ambulatory Visit: Payer: Self-pay | Admitting: Family

## 2017-10-06 ENCOUNTER — Ambulatory Visit: Payer: 59 | Admitting: Allergy and Immunology

## 2017-10-06 DIAGNOSIS — K649 Unspecified hemorrhoids: Secondary | ICD-10-CM

## 2017-10-06 MED ORDER — HYDROCORTISONE 2.5 % RE CREA: 1.0000 "application " | TOPICAL_CREAM | Freq: Two times a day (BID) | RECTAL | 0 refills | Status: DC

## 2017-10-06 NOTE — Telephone Encounter (Signed)
What symptoms do you have? hemorrhoids  How long have you been sick? Two weeks  Have you been seen for this problem? yes  If your provider decides to give you a prescription, which pharmacy would you like for [facility] [place]   Patient informed that this information will be sent to the clinical staff for review and that they should receive a follow up call.

## 2017-10-06 NOTE — Telephone Encounter (Signed)
Please advise 

## 2017-10-06 NOTE — Telephone Encounter (Signed)
proctosol bream rx sent to pharmacy

## 2017-10-21 ENCOUNTER — Telehealth: Payer: Self-pay

## 2017-10-21 NOTE — Telephone Encounter (Signed)
Patient states that when Remus Lofflerambien was last filled it was reduced from 10mg  to 5mg . She is not sleeping as well with the 5 and wanted to know what she should try. I advised to try 1 1/2 tabs for a few nights and then reduce to one nightly. She is concerned that she will run out of meds early. I advised her to call the office if she will not have enough. FYI

## 2017-11-04 ENCOUNTER — Telehealth: Payer: Self-pay | Admitting: Nurse Practitioner

## 2017-11-04 ENCOUNTER — Encounter: Payer: Self-pay | Admitting: Allergy & Immunology

## 2017-11-04 ENCOUNTER — Ambulatory Visit: Payer: 59 | Admitting: Allergy & Immunology

## 2017-11-04 VITALS — BP 132/70 | HR 76 | Resp 16

## 2017-11-04 DIAGNOSIS — J302 Other seasonal allergic rhinitis: Secondary | ICD-10-CM

## 2017-11-04 DIAGNOSIS — J3089 Other allergic rhinitis: Secondary | ICD-10-CM | POA: Diagnosis not present

## 2017-11-04 DIAGNOSIS — T7800XD Anaphylactic reaction due to unspecified food, subsequent encounter: Secondary | ICD-10-CM | POA: Diagnosis not present

## 2017-11-04 DIAGNOSIS — J4530 Mild persistent asthma, uncomplicated: Secondary | ICD-10-CM | POA: Diagnosis not present

## 2017-11-04 MED ORDER — EPINEPHRINE 0.3 MG/0.3ML IJ SOAJ: 0.3000 mg | Freq: Once | INTRAMUSCULAR | 2 refills | Status: DC

## 2017-11-04 NOTE — Patient Instructions (Addendum)
1. Mild persistent asthma, uncomplicated - Lung function looked fairly good today. - It seems that the Singulair is helping your symptoms. - Daily controller medication(s): Singulair 10mg  daily - Prior to physical activity: ProAir 2 puffs 10-15 minutes before physical activity. - Rescue medications: ProAir 4 puffs every 4-6 hours as needed - Asthma control goals:  * Full participation in all desired activities (may need albuterol before activity) * Albuterol use two time or less a week on average (not counting use with activity) * Cough interfering with sleep two time or less a month * Oral steroids no more than once a year * No hospitalizations  2. Seasonal and perennial allergic rhinitis - Continue with Singulair (montelukast) 10mg  daily. - Continue with Zyrtec (cetirizine) 10mg  as needed.   3. Anaphylactic shock due to food (alpha-gal sensitivity) - We will get blood testing to see where your alpha gal levels are at this point. - We will send in a prescription for AuviQ (epinephrine). - They should contact you within 24-48 hours to confirm your shipping address.  4. Return in about 1 year (around 11/04/2018).   Please inform us of any Emergency Department visits, hospitalizations, or changes in symptoms. Call us before going to the ED for breathing or allergy symptoms since we might be able to fit you in for a sick visit. Feel free to contact us anytime with any questions, problems, or concerns.  It was a pleasure to meet you today! Enjoy the holiday season!  Websites that have reliable patient information: 1. American Academy of Asthma, Allergy, and Immunology: www.aaaai.org 2. Food Allergy Research and Education (FARE): foodallergy.org 3. Mothers of Asthmatics: http://www.asthmacommunitynetwork.org 4. American College of Allergy, Asthma, and Immunology: www.acaai.org

## 2017-11-04 NOTE — Progress Notes (Signed)
FOLLOW UP  Date of Service/Encounter:  11/04/17   Assessment:   Mild persistent asthma, uncomplicated  Seasonal and perennial allergic rhinitis (dust mites, ragweed, cats)  Anaphylactic shock due to food (alpha gal sensitivity)  Plan/Recommendations:   1. Mild persistent asthma, uncomplicated - Lung function looked fairly good today. - It seems that the Singulair is helping your symptoms. - Daily controller medication(s): Singulair 10mg  daily - Prior to physical activity: ProAir 2 puffs 10-15 minutes before physical activity. - Rescue medications: ProAir 4 puffs every 4-6 hours as needed - Asthma control goals:  * Full participation in all desired activities (may need albuterol before activity) * Albuterol use two time or less a week on average (not counting use with activity) * Cough interfering with sleep two time or less a month * Oral steroids no more than once a year * No hospitalizations  2. Seasonal and perennial allergic rhinitis - Continue with Singulair (montelukast) 10mg  daily. - Continue with Zyrtec (cetirizine) 10mg  as needed.   3. Anaphylactic shock due to food (alpha-gal sensitivity) - We will get blood testing to see where your alpha gal levels are at this point. - We will send in a prescription for AuviQ (epinephrine). - They should contact you within 24-48 hours to confirm your shipping address.  4. Return in about 1 year (around 11/04/2018).    Subjective:   Christine [name] is a 41 y.o. female presenting today for follow up of  Chief Complaint  Patient presents with  . Asthma    Christine MontaneJenny H [name] has a history of the following: Patient Active Problem List   Diagnosis Date Noted  . Asthma with acute exacerbation 06/23/2017  . Dyspnea 06/23/2017  . Allergic rhinitis 02/18/2016  . Alpha gal hypersensitivity 09/06/2015  . White coat syndrome with high blood pressure without hypertension 09/06/2015  . BMI 32.0-32.9,adult 09/06/2015  . Mild  persistent asthma 02/10/2013  . Generalized anxiety disorder 02/10/2013  . Vitamin D deficiency     History obtained from: chart review and patient.  Christine [name] Primary Care Provider is Christine [name], Mary-Margaret, Christine [name].     Christine [name] is a 41 y.o. female presenting for a follow up visit. She was last seen in July 2018 by Dr. Nunzio CobbsBobbitt. At that time, she had having problems with chest tightness and SOB. At that visit, her Flovent was increased to three puffs TID and she was provided with a prednisone packet. She was continued on Singulair and albuterol as needed. She also has a history of alpha gal hypersensitivity.   Since the last visit, she has mostly done well. However, she had some horrible reflux which led to SOB and anxiety. She knows that any kind of health concern causes her to have problems with her anxiety. In any case, she has cut out multiple foods from her diet and used an OTC medication to help improve this. This is all being managed by Dr. Pollyann Kennedyosen. Christine [name] prefers to avoid pharmacologic regimens. She does sleep on an elevated surface. Overall, she does feel as if she is improving. She does endorse phlegm within her throat but not to the same extent.   She does endorse some problems with asthma in the morning. She does use her inhaler - up to 3 times weekly - mostly in the mornings to "break up" the mucous in her lungs and chest. Then she does well through the remainder of the day. She denies night time coughing. She remains on Singulair; she has tried  getting off of it herself, but the symptoms rebound. She was on Qvar at some point, but reported thrush with this. She does not use it on a regular basis. She would prefer to use prednisone, and estimates that she needs it around once per year at the most.   She does have a history of allergic rhinitis. The last testing was performed in 2001 and was positive to dust mites, cat, and ragweed. She did get allergy shots during two separate periods  of time in her past, but it is unclear how much she actually completed. She is not taking anything on a daily basis for her allergies. Overall, these have become less of an issue over time.   She has a history of alpha gal sensitivity. She is unsure of the time when this was actually diagnosed. She continues to avoid all red meats. She does have an out of date EpiPen. Her last testing from December 2015 is below:       Otherwise, there have been no changes to her past medical history, surgical history, family history, or social history.    Review of Systems: a 14-point review of systems is pertinent for what is mentioned in HPI.  Otherwise, all other systems were negative. Constitutional: negative other than that listed in the HPI Eyes: negative other than that listed in the HPI Ears, nose, mouth, throat, and face: negative other than that listed in the HPI Respiratory: negative other than that listed in the HPI Cardiovascular: negative other than that listed in the HPI Gastrointestinal: negative other than that listed in the HPI Genitourinary: negative other than that listed in the HPI Integument: negative other than that listed in the HPI Hematologic: negative other than that listed in the HPI Musculoskeletal: negative other than that listed in the HPI Neurological: negative other than that listed in the HPI Allergy/Immunologic: negative other than that listed in the HPI    Objective:   Blood pressure 132/70, pulse 76, resp. rate 16. There is no height or weight on file to calculate BMI.   Physical Exam:  General: Alert, interactive, in no acute distress. Pleasant female.  Eyes: No conjunctival injection bilaterally, no discharge on the right, no discharge on the left and no Horner-Trantas dots present. PERRL bilaterally. EOMI without pain. No photophobia.  Ears: Right TM pearly gray with normal light reflex, Left TM pearly gray with normal light reflex, Right TM intact  without perforation and Left TM intact without perforation.  Nose/Throat: External nose within normal limits, nasal crease present and septum midline. Turbinates edematous and pale with clear discharge. Posterior oropharynx erythematous without cobblestoning in the posterior oropharynx. Tonsils 2+ without exudates.  Tongue without thrush. Adenopathy: no enlarged lymph nodes appreciated in the anterior cervical, occipital, axillary, epitrochlear, inguinal, or popliteal regions. Lungs: Clear to auscultation without wheezing, rhonchi or rales. No increased work of breathing. CV: Normal S1/S2. No murmurs. Capillary refill <2 seconds.  Skin: Warm and dry, without lesions or rashes. Neuro:   Grossly intact. No focal deficits appreciated. Responsive to questions.  Diagnostic studies: none  Spirometry: results normal (FEV1: 2.52/75%, FVC: 3.19/82%, FEV1/FVC: 79%).    Spirometry consistent with normal pattern.   Allergy Studies: none     Malachi BondsJoel Raydin Bielinski, MD [facility]

## 2017-11-05 MED ORDER — ZOLPIDEM TARTRATE 10 MG PO TABS: 10.0000 mg | ORAL_TABLET | Freq: Every evening | ORAL | 1 refills | Status: DC | PRN

## 2017-11-05 NOTE — Telephone Encounter (Signed)
ambien 10mg  1 po qhs #30 1 refill let patient know increased ambien to 10mg  qhs

## 2017-11-05 NOTE — Telephone Encounter (Signed)
Pt notified of recommendation  Verbalizes understanding [facility]

## 2017-11-06 ENCOUNTER — Other Ambulatory Visit: Payer: Self-pay | Admitting: *Deleted

## 2017-11-06 DIAGNOSIS — J4531 Mild persistent asthma with (acute) exacerbation: Secondary | ICD-10-CM

## 2017-11-06 MED ORDER — MONTELUKAST SODIUM 10 MG PO TABS: 10.0000 mg | ORAL_TABLET | Freq: Every day | ORAL | 0 refills | Status: DC

## 2017-11-07 LAB — ALPHA-GAL PANEL
ALPHA GAL IGE: 6.05 kU/L — AB (ref <0.10)
BEEF (BOS SPP) IGE: 3.93 kU/L — AB (ref <0.35)
Class Interpretation: 3
LAMB CLASS INTERPRETATION: 2
Lamb/Mutton (Ovis spp) IgE: 2.12 kU/L — ABNORMAL HIGH (ref <0.35)
PORK (SUS SPP) IGE: 1.61 kU/L — AB (ref <0.35)
PORK CLASS INTERPRETATION: 2

## 2017-12-01 ENCOUNTER — Telehealth: Payer: Self-pay | Admitting: Allergy and Immunology

## 2017-12-01 NOTE — Telephone Encounter (Signed)
Pt called and said that you had giving her a paper to get free epi pen and she has lost it and needs another one [phone].

## 2017-12-02 MED ORDER — EPINEPHRINE 0.3 MG/0.3ML IJ SOAJ: 0.3000 mg | Freq: Once | INTRAMUSCULAR | 1 refills | Status: AC

## 2017-12-02 NOTE — Telephone Encounter (Signed)
Called and left message for patient with contact information [facility]. I also sent in the prescription for Auvi-Q because it looked like the prescription had been discontinued or expired.

## 2017-12-09 ENCOUNTER — Other Ambulatory Visit: Payer: Self-pay | Admitting: Nurse Practitioner

## 2017-12-09 DIAGNOSIS — F411 Generalized anxiety disorder: Secondary | ICD-10-CM

## 2017-12-09 NOTE — Telephone Encounter (Signed)
Last seen 12/04/16  MMM 

## 2018-02-10 ENCOUNTER — Other Ambulatory Visit: Payer: Self-pay | Admitting: Nurse Practitioner

## 2018-02-10 DIAGNOSIS — J4531 Mild persistent asthma with (acute) exacerbation: Secondary | ICD-10-CM

## 2018-02-11 ENCOUNTER — Other Ambulatory Visit: Payer: Self-pay | Admitting: Nurse Practitioner

## 2018-03-24 DIAGNOSIS — Z01411 Encounter for gynecological examination (general) (routine) with abnormal findings: Secondary | ICD-10-CM | POA: Diagnosis not present

## 2018-03-24 DIAGNOSIS — Z124 Encounter for screening for malignant neoplasm of cervix: Secondary | ICD-10-CM | POA: Diagnosis not present

## 2018-03-25 ENCOUNTER — Other Ambulatory Visit: Payer: Self-pay | Admitting: Obstetrics and Gynecology

## 2018-03-25 DIAGNOSIS — R928 Other abnormal and inconclusive findings on diagnostic imaging of breast: Secondary | ICD-10-CM

## 2018-04-02 ENCOUNTER — Ambulatory Visit
Admission: RE | Admit: 2018-04-02 | Discharge: 2018-04-02 | Disposition: A | Discharge status: Home / Self Care | Payer: 59 | Source: Ambulatory Visit | Attending: Obstetrics and Gynecology | Admitting: Obstetrics and Gynecology

## 2018-04-02 ENCOUNTER — Ambulatory Visit: Payer: Commercial Managed Care - HMO

## 2018-04-02 DIAGNOSIS — R922 Inconclusive mammogram: Secondary | ICD-10-CM | POA: Diagnosis not present

## 2018-04-02 DIAGNOSIS — R928 Other abnormal and inconclusive findings on diagnostic imaging of breast: Secondary | ICD-10-CM

## 2018-04-30 ENCOUNTER — Ambulatory Visit: Payer: 59 | Admitting: Family Medicine

## 2018-04-30 ENCOUNTER — Telehealth: Payer: Self-pay | Admitting: Nurse Practitioner

## 2018-04-30 ENCOUNTER — Encounter: Payer: Self-pay | Admitting: Family Medicine

## 2018-04-30 VITALS — BP 157/96 | HR 72 | Temp 97.2°F | Ht 65.0 in | Wt 195.0 lb

## 2018-04-30 DIAGNOSIS — J45909 Unspecified asthma, uncomplicated: Secondary | ICD-10-CM | POA: Diagnosis not present

## 2018-04-30 MED ORDER — PREDNISONE 20 MG PO TABS: ORAL_TABLET | ORAL | 0 refills | Status: DC

## 2018-04-30 MED ORDER — METHYLPREDNISOLONE ACETATE 80 MG/ML IJ SUSP
80.0000 mg | Freq: Once | INTRAMUSCULAR | Status: AC
  Administered 2018-04-30: 80 mg via INTRAMUSCULAR

## 2018-04-30 NOTE — Progress Notes (Signed)
BP (!) 157/96   Pulse 72   Temp (!) 97.2 F (36.2 C) (Oral)   Ht 5\' 5"  (1.651 m)   Wt 195 lb (88.5 kg)   LMP 04/09/2018   BMI 32.45 kg/m    Subjective:    Patient ID: Christine [name], female    DOB: Jan 03, 1976, 42 y.o.   MRN: [id number]  HPI: Christine [name] is a 42 y.o. female presenting on 04/30/2018 for Nasal Congestion (drainage ) and Cough (no fever )   HPI Cough and congestion and wheezing Patient comes in clinic cough and congestion and wheezing that is been increasing over the past week.  Patient has been using Mucinex for 2 days and does not feel like it is helping any.  She does get like this frequently this time a year.  She feels like she is getting some facial pressure along with the drainage and she does feels tired most of the time over the past couple days.  She does feel like it is not improving.  Relevant past medical, surgical, family and social history reviewed and updated as indicated. Interim medical history since our last visit reviewed. Allergies and medications reviewed and updated.  Review of Systems  Constitutional: Negative for chills and fever.  HENT: Positive for congestion, postnasal drip, rhinorrhea, sinus pressure, sneezing and sore throat. Negative for ear discharge and ear pain.   Eyes: Negative for pain, redness and visual disturbance.  Respiratory: Positive for cough and wheezing. Negative for chest tightness and shortness of breath.   Cardiovascular: Negative for chest pain and leg swelling.  Genitourinary: Negative for difficulty urinating and dysuria.  Musculoskeletal: Negative for back pain and gait problem.  Skin: Negative for rash.  Neurological: Negative for light-headedness and headaches.  Psychiatric/Behavioral: Negative for agitation and behavioral problems.  All other systems reviewed and are negative.   Per HPI unless specifically indicated above   Allergies as of 04/30/2018      Reactions   Sulfa Antibiotics Hives   Sulfasalazine Hives   Doxycycline Hyclate    Ivp Dye [iodinated Diagnostic Agents] Hives   Other    ALPHA GAL - MEAT ALLERGY    Paxil [paroxetine Hcl] Other (See Comments)   Made pt very angry   Phosphorus Diarrhea      Medication List        Accurate as of 04/30/18  4:37 PM. Always use your most recent med list.          ALPRAZolam 0.25 MG tablet Commonly known as:  XANAX TAKE 1 TABLET BY MOUTH TWICE A DAY AS NEEDED (NEEDS TO BE SEEN)   fluticasone 110 MCG/ACT inhaler Commonly known as:  FLOVENT HFA Inhale 2 puffs into the lungs 2 (two) times daily.   [facility] 1/35 1-35 MG-MCG tablet Generic drug:  ethynodiol-ethinyl estradiol Take 1 tablet by mouth daily.   Magnesium 200 MG Tabs Take 2 tablets by mouth daily.   montelukast 10 MG tablet Commonly known as:  SINGULAIR TAKE 1 TABLET BY MOUTH EVERYDAY AT BEDTIME   predniSONE 20 MG tablet Commonly known as:  DELTASONE 2 po at same time daily for 5 days   VENTOLIN HFA 108 (90 Base) MCG/ACT inhaler Generic drug:  albuterol TAKE 2 PUFFS BY MOUTH EVERY 6 HOURS AS NEEDED FOR WHEEZE OR SHORTNESS OF BREATH   Vitamin D3 1000 units Caps Take by mouth.   zolpidem 10 MG tablet Commonly known as:  AMBIEN TAKE 1 TABLET BY MOUTH AT BEDTIME  Objective:    BP (!) 157/96   Pulse 72   Temp (!) 97.2 F (36.2 C) (Oral)   Ht 5\' 5"  (1.651 m)   Wt 195 lb (88.5 kg)   LMP 04/09/2018   BMI 32.45 kg/m   Wt Readings from Last 3 Encounters:  04/30/18 195 lb (88.5 kg)  09/24/17 193 lb (87.5 kg)  09/15/17 197 lb 9.6 oz (89.6 kg)    Physical Exam  Constitutional: She is oriented to person, place, and time. She appears well-developed and well-nourished. No distress.  HENT:  Right Ear: Tympanic membrane, external ear and ear canal normal.  Left Ear: Tympanic membrane, external ear and ear canal normal.  Nose: Mucosal edema and rhinorrhea present. No epistaxis. Right sinus exhibits no maxillary sinus tenderness and no  frontal sinus tenderness. Left sinus exhibits no maxillary sinus tenderness and no frontal sinus tenderness.  Mouth/Throat: Uvula is midline and mucous membranes are normal. Posterior oropharyngeal edema and posterior oropharyngeal erythema present. No oropharyngeal exudate or tonsillar abscesses.  Eyes: Conjunctivae and EOM are normal.  Cardiovascular: Normal rate, regular rhythm, normal heart sounds and intact distal pulses.  No murmur heard. Pulmonary/Chest: Effort normal and breath sounds normal. No respiratory distress. She has no wheezes. She exhibits no tenderness.  Neurological: She is alert and oriented to person, place, and time. Coordination normal.  Skin: Skin is warm and dry. No rash noted. She is not diaphoretic.  Psychiatric: She has a normal mood and affect. Her behavior is normal.  Vitals reviewed.       Assessment & Plan:   Problem List Items Addressed This Visit    None    Visit Diagnoses    Uncomplicated asthma, unspecified asthma severity, unspecified whether persistent    -  Primary   Gave Depo-Medrol shot here in the office and sample of Symbicort and short course of prednisone.  Return if does not improve or worsens   Relevant Medications   methylPREDNISolone acetate (DEPO-MEDROL) injection 80 mg (Completed)   predniSONE (DELTASONE) 20 MG tablet       Follow up plan: Return if symptoms worsen or fail to improve.  Counseling provided for all of the vaccine components No orders of the defined types were placed in this encounter.   Arville CareJoshua Genevie Elman, MD [facility] 04/30/2018, 4:37 PM

## 2018-04-30 NOTE — Telephone Encounter (Signed)
Patient aware and appt made with on call provider.

## 2018-04-30 NOTE — Telephone Encounter (Signed)
Pt c/o head congestion x 2 days. Pt is taking Mucinex has noticed some change, but not much. Pt also has drainage down the back of throat with a cough. Pt denies fever. With Slight runny nose. Pt just started a new job, and she can not afford to leave right now. [facility] [place] Pt asking for zpack. Covering Provider please advise

## 2018-04-30 NOTE — Telephone Encounter (Signed)
If she thinks she needs abx, she needs to be seen.  We have Sat hours if this works better for her.  Otherwise, would recommend starting something for allergy like Claritin or Flonase.

## 2018-05-09 ENCOUNTER — Other Ambulatory Visit: Payer: Self-pay | Admitting: Nurse Practitioner

## 2018-05-09 DIAGNOSIS — J4531 Mild persistent asthma with (acute) exacerbation: Secondary | ICD-10-CM

## 2018-06-13 ENCOUNTER — Other Ambulatory Visit: Payer: Self-pay | Admitting: Nurse Practitioner

## 2018-06-13 DIAGNOSIS — F411 Generalized anxiety disorder: Secondary | ICD-10-CM

## 2018-06-16 ENCOUNTER — Other Ambulatory Visit: Payer: Self-pay | Admitting: Nurse Practitioner

## 2018-06-17 ENCOUNTER — Other Ambulatory Visit: Payer: Self-pay

## 2018-06-17 DIAGNOSIS — F411 Generalized anxiety disorder: Secondary | ICD-10-CM

## 2018-06-17 MED ORDER — ALPRAZOLAM 0.25 MG PO TABS: ORAL_TABLET | ORAL | 1 refills | Status: DC

## 2018-06-17 MED ORDER — ZOLPIDEM TARTRATE 10 MG PO TABS: 10.0000 mg | ORAL_TABLET | Freq: Every day | ORAL | 1 refills | Status: DC

## 2018-06-17 NOTE — Telephone Encounter (Signed)
Last seen 6/19  MMM 

## 2018-06-22 ENCOUNTER — Telehealth: Payer: Self-pay | Admitting: Nurse Practitioner

## 2018-06-22 DIAGNOSIS — F411 Generalized anxiety disorder: Secondary | ICD-10-CM

## 2018-06-24 MED ORDER — ZOLPIDEM TARTRATE 10 MG PO TABS
10.0000 mg | ORAL_TABLET | Freq: Every day | ORAL | 1 refills | Status: DC
Start: 2018-06-24 — End: 2018-10-18

## 2018-06-24 MED ORDER — ALPRAZOLAM 0.25 MG PO TABS: ORAL_TABLET | ORAL | 1 refills | Status: DC

## 2018-06-24 NOTE — Telephone Encounter (Signed)
meds sent to correct pharmacy

## 2018-08-08 ENCOUNTER — Other Ambulatory Visit: Payer: Self-pay | Admitting: Nurse Practitioner

## 2018-08-08 DIAGNOSIS — J4531 Mild persistent asthma with (acute) exacerbation: Secondary | ICD-10-CM

## 2018-09-06 ENCOUNTER — Other Ambulatory Visit: Payer: Self-pay | Admitting: Nurse Practitioner

## 2018-09-06 DIAGNOSIS — F411 Generalized anxiety disorder: Secondary | ICD-10-CM

## 2018-09-09 ENCOUNTER — Other Ambulatory Visit: Payer: Self-pay | Admitting: Nurse Practitioner

## 2018-09-09 DIAGNOSIS — F411 Generalized anxiety disorder: Secondary | ICD-10-CM

## 2018-09-13 ENCOUNTER — Encounter: Payer: Self-pay | Admitting: Pediatrics

## 2018-09-13 ENCOUNTER — Ambulatory Visit (INDEPENDENT_AMBULATORY_CARE_PROVIDER_SITE_OTHER): Payer: 59 | Admitting: Pediatrics

## 2018-09-13 ENCOUNTER — Telehealth: Payer: Self-pay | Admitting: Allergy

## 2018-09-13 VITALS — BP 124/78 | HR 86 | Temp 98.6°F | Resp 16 | Ht 63.8 in | Wt 191.8 lb

## 2018-09-13 DIAGNOSIS — T7800XD Anaphylactic reaction due to unspecified food, subsequent encounter: Secondary | ICD-10-CM | POA: Diagnosis not present

## 2018-09-13 DIAGNOSIS — J4541 Moderate persistent asthma with (acute) exacerbation: Secondary | ICD-10-CM

## 2018-09-13 DIAGNOSIS — J302 Other seasonal allergic rhinitis: Secondary | ICD-10-CM

## 2018-09-13 DIAGNOSIS — J3089 Other allergic rhinitis: Secondary | ICD-10-CM | POA: Diagnosis not present

## 2018-09-13 DIAGNOSIS — K219 Gastro-esophageal reflux disease without esophagitis: Secondary | ICD-10-CM

## 2018-09-13 DIAGNOSIS — J4520 Mild intermittent asthma, uncomplicated: Secondary | ICD-10-CM | POA: Diagnosis not present

## 2018-09-13 MED ORDER — TIOTROPIUM BROMIDE MONOHYDRATE 1.25 MCG/ACT IN AERS: 2.0000 | INHALATION_SPRAY | Freq: Every day | RESPIRATORY_TRACT | 5 refills | Status: DC

## 2018-09-13 MED ORDER — ALBUTEROL SULFATE (2.5 MG/3ML) 0.083% IN NEBU: 2.5000 mg | INHALATION_SOLUTION | RESPIRATORY_TRACT | 1 refills | Status: DC | PRN

## 2018-09-13 MED ORDER — OMEPRAZOLE 20 MG PO CPDR: 20.0000 mg | DELAYED_RELEASE_CAPSULE | Freq: Every day | ORAL | 5 refills | Status: DC

## 2018-09-13 NOTE — Patient Instructions (Addendum)
Asthma Continue montelukast 10 mg once a day to prevent cough or wheeze Begin Spiriva Respimat 1.25 mcg - 2 puffs once a day to prevent cough or wheeze Ventolin 2 puffs twice a day every 4 hours as needed for cough or wheeze or instead albuterol 0.083% one unit dose via nebulizer every 4 hours as needed for cough or wheeze. You may use Ventolin 2 puffs 5-15 minutes before exercise to decrease cough or wheeze  Seasonal and perennial allergic rhinitis Continue nasal saline rinses Flonase 2 sprays in each nostril once a day as needed for a stuffy nose Azelastine 2 sprays in each nostril once a day as needed for a sunny nose Guaifenesin [phone] mg twice a day to thin post nasal secretions Cetirizine 10 mg once a day as needed for a runny nose Prednisone 10 mg tablet. Take 2 tablets once a day for 4 days, then take 1 tablet on the 5th day, then stop.    Anaphylactic shock due to food, subsequent encounter Continue to avoid mammalian meats. In case of an allergic reaction, give Benadryl 50 mg every 4 hours, and if life-threatening symptoms occur, inject with EpiPen 0.3 mg.  Reflux Continue taking omeprazole 20 mg but increase this to once a day Limit caffeine intake including coffee, soda, and chocolate  Follow up in 6 weeks or sooner if needed

## 2018-09-13 NOTE — Progress Notes (Signed)
[address] [place] [address] Dept: [phone]  FOLLOW UP NOTE  Patient ID: Christine [name], female    DOB: 1976/01/08  Age: 42 y.o. MRN: [id number] Date of Office Visit: 09/13/2018  Assessment  Chief Complaint: Breathing Problem (SX WORSE WHEN PT LAYS DOWN.); Cough (feels like she can not breathe deep); and Nasal Congestion (PND)  HPI Christine [name] is a 42 year old female who presents to the clinic for a follow up visit. She reports her asthma has been poorly controlled with chest tightness, shortness of breath, cough, and wheeze with activity and rest that began over the weekend. She is currently using montelukast 10 mg once a day and albuterol inhaler daily. She reports she has tried [employer] and Symbicort with each one resulting in oral thrush despite reported strict adherence to directions for use. Allergic rhinitis is reported as well controlled with as needed Nasonex and over the counter antihistamine. She reports an increase in heart burn over the last 1-2 weeks and will occasionally take over the counter omeprazole 20 mg with relief. Her current medications are listed in the chart.   Drug Allergies:  Allergies  Allergen Reactions  . Sulfa Antibiotics Hives  . Sulfasalazine Hives  . Doxycycline Hyclate   . Ivp Dye [Iodinated Diagnostic Agents] Hives  . Other     ALPHA GAL - MEAT ALLERGY   . Paxil [Paroxetine Hcl] Other (See Comments)    Made pt very angry   . Phosphorus Diarrhea    Physical Exam: BP 124/78 (BP Location: Right Arm, Patient Position: Sitting, Cuff Size: Normal)   Pulse 86   Temp 98.6 F (37 C) (Oral)   Resp 16   Ht 5' 3.8" (1.621 m)   Wt 191 lb 12.8 oz (87 kg)   SpO2 97%   BMI 33.13 kg/m    Physical Exam  Constitutional: She is oriented to person, place, and time. She appears well-developed and well-nourished.  HENT:  Head: Normocephalic.  Right Ear: External ear normal.  Left Ear: External ear normal.  Mouth/Throat: Oropharynx is  clear and moist.  Bilateral nares slightly erythematous and edematous with clear nasal drainage noted. Pharynx normal. Ears normal. Eyes normal.  Eyes: Conjunctivae are normal.  Neck: Normal range of motion. Neck supple.  Cardiovascular: Normal rate, regular rhythm and normal heart sounds.  No murmur noted  Pulmonary/Chest: Effort normal and breath sounds normal.  Lungs clear to auscultation  Musculoskeletal: Normal range of motion.  Neurological: She is alert and oriented to person, place, and time.  Skin: Skin is warm and dry.  Psychiatric: She has a normal mood and affect. Her behavior is normal. Judgment and thought content normal.  Vitals reviewed.   Diagnostics: FVC 3.09, FEV1 2.46. Predicted FVC 3.66, predicted FEV1 2.95. Spirometry is within the normal limit.    Assessment and Plan: 1. Moderate persistent asthma with acute exacerbation   2. Seasonal and perennial allergic rhinitis   3. Anaphylactic shock due to food, subsequent encounter   4. Gastroesophageal reflux disease, esophagitis presence not specified     Meds ordered this encounter  Medications  . Tiotropium Bromide Monohydrate (SPIRIVA RESPIMAT) 1.25 MCG/ACT AERS    Sig: Inhale 2 puffs into the lungs daily.    Dispense:  4 g    Refill:  5  . albuterol (PROVENTIL) (2.5 MG/3ML) 0.083% nebulizer solution    Sig: Take 3 mLs (2.5 mg total) by nebulization every 4 (four) hours as needed for wheezing or shortness of  breath.    Dispense:  75 mL    Refill:  1  . omeprazole (PRILOSEC) 20 MG capsule    Sig: Take 1 capsule (20 mg total) by mouth daily.    Dispense:  30 capsule    Refill:  5    Patient Instructions  Asthma Continue montelukast 10 mg once a day to prevent cough or wheeze Begin Spiriva Respimat 1.25 mcg - 2 puffs once a day to prevent cough or wheeze Ventolin 2 puffs twice a day every 4 hours as needed for cough or wheeze or instead albuterol 0.083% one unit dose via nebulizer every 4 hours as needed  for cough or wheeze. You may use Ventolin 2 puffs 5-15 minutes before exercise to decrease cough or wheeze  Seasonal and perennial allergic rhinitis Continue nasal saline rinses Flonase 2 sprays in each nostril once a day as needed for a stuffy nose Azelastine 2 sprays in each nostril once a day as needed for a sunny nose Guaifenesin [phone] mg twice a day to thin post nasal secretions Cetirizine 10 mg once a day as needed for a runny nose Prednisone 10 mg tablet. Take 2 tablets once a day for 4 days, then take 1 tablet on the 5th day, then stop.    Anaphylactic shock due to food, subsequent encounter Continue to avoid mammalian meats. In case of an allergic reaction, give Benadryl 50 mg every 4 hours, and if life-threatening symptoms occur, inject with EpiPen 0.3 mg.  Reflux Continue taking omeprazole 20 mg but increase this to once a day Limit caffeine intake including coffee, soda, and chocolate  Follow up in 6 weeks or sooner if needed  Return in about 6 weeks (around 10/25/2018).   Thank you for the opportunity to care for this patient.  Please do not hesitate to contact me with questions.  Thermon Leyland, FNP [facility]  I have provided oversight concerning Thermon Leyland' evaluation and treatment of this patient's health issues addressed during today's encounter. I agree with the assessment and therapeutic plan as outlined in the note.   Thank you for the opportunity to care for this patient.  Please do not hesitate to contact me with questions.  Tonette Bihari, M.D.  [facility] [address] [place], [place] [address] [phone]

## 2018-09-13 NOTE — Telephone Encounter (Signed)
Informed patient to take the omeprazole OTC every day. Per Thermon Leyland FNP

## 2018-09-24 DIAGNOSIS — J4520 Mild intermittent asthma, uncomplicated: Secondary | ICD-10-CM | POA: Diagnosis not present

## 2018-10-14 DIAGNOSIS — J4520 Mild intermittent asthma, uncomplicated: Secondary | ICD-10-CM | POA: Diagnosis not present

## 2018-10-18 ENCOUNTER — Other Ambulatory Visit: Payer: Self-pay | Admitting: Nurse Practitioner

## 2018-10-19 NOTE — Telephone Encounter (Signed)
Last seen 05/10/18  MMM 

## 2018-10-24 DIAGNOSIS — J4520 Mild intermittent asthma, uncomplicated: Secondary | ICD-10-CM | POA: Diagnosis not present

## 2018-10-31 ENCOUNTER — Other Ambulatory Visit: Payer: Self-pay | Admitting: Nurse Practitioner

## 2018-10-31 DIAGNOSIS — F411 Generalized anxiety disorder: Secondary | ICD-10-CM

## 2018-11-01 NOTE — Telephone Encounter (Signed)
Last seen 04/30/18  MMM 

## 2018-11-13 DIAGNOSIS — J4520 Mild intermittent asthma, uncomplicated: Secondary | ICD-10-CM | POA: Diagnosis not present

## 2018-12-01 ENCOUNTER — Other Ambulatory Visit: Payer: Self-pay | Admitting: Nurse Practitioner

## 2018-12-01 DIAGNOSIS — F411 Generalized anxiety disorder: Secondary | ICD-10-CM

## 2018-12-09 ENCOUNTER — Encounter: Payer: Self-pay | Admitting: Family Medicine

## 2018-12-09 ENCOUNTER — Ambulatory Visit: Payer: BLUE CROSS/BLUE SHIELD | Admitting: Family Medicine

## 2018-12-09 ENCOUNTER — Other Ambulatory Visit: Payer: Self-pay | Admitting: Nurse Practitioner

## 2018-12-09 VITALS — BP 128/82 | HR 77 | Temp 98.3°F | Resp 18

## 2018-12-09 DIAGNOSIS — K219 Gastro-esophageal reflux disease without esophagitis: Secondary | ICD-10-CM | POA: Diagnosis not present

## 2018-12-09 DIAGNOSIS — T7800XD Anaphylactic reaction due to unspecified food, subsequent encounter: Secondary | ICD-10-CM

## 2018-12-09 DIAGNOSIS — L500 Allergic urticaria: Secondary | ICD-10-CM

## 2018-12-09 DIAGNOSIS — J4541 Moderate persistent asthma with (acute) exacerbation: Secondary | ICD-10-CM

## 2018-12-09 DIAGNOSIS — J3089 Other allergic rhinitis: Secondary | ICD-10-CM | POA: Diagnosis not present

## 2018-12-09 DIAGNOSIS — F411 Generalized anxiety disorder: Secondary | ICD-10-CM

## 2018-12-09 DIAGNOSIS — J302 Other seasonal allergic rhinitis: Secondary | ICD-10-CM

## 2018-12-09 MED ORDER — TIOTROPIUM BROMIDE MONOHYDRATE 1.25 MCG/ACT IN AERS: 2.0000 | INHALATION_SPRAY | Freq: Every day | RESPIRATORY_TRACT | 5 refills | Status: DC

## 2018-12-09 MED ORDER — PANTOPRAZOLE SODIUM 40 MG PO TBEC: 40.0000 mg | DELAYED_RELEASE_TABLET | Freq: Every day | ORAL | 5 refills | Status: DC

## 2018-12-09 NOTE — Patient Instructions (Addendum)
Asthma with exacerbation Begin Spiriva Respimat 1.25 mcg - 2 puffs once a day to prevent cough or wheeze Prednisone 10 mg tablet. Take 2 tablets twice a day for 3 days, then take 2 tablets once a day for 1 day, then take 1 tablet on the 5th day, then stop. We will get some labs in about 4 weeks to help [place] determine how to better treat your asthma Continue montelukast 10 mg once a day to prevent cough or wheeze Ventolin 2 puffs twice a day every 4 hours as needed for cough or wheeze or instead albuterol 0.083% one unit dose via nebulizer every 4 hours as needed for cough or wheeze.  You may use Ventolin 2 puffs 5-15 minutes before exercise to decrease cough or wheeze   Reflux Stop Prilosec OTC. Begin Protonix 40 mg once a day for reflux. Take 30 minutes before breakfast Limit caffeine intake including coffee, soda, alcohol, and chocolate If this condition worsens or does not resolve, consider referral to Gastroenterology for further evaluation  Seasonal and perennial allergic rhinitis Continue nasal saline rinses Flonase 2 sprays in each nostril once a day as needed for a stuffy nose Azelastine 2 sprays in each nostril once a day as needed for a sunny nose Guaifenesin [phone] mg twice a day to thin post nasal secretions Cetirizine 10 mg once a day as needed for a runny nose  Anaphylactic shock due to food, subsequent encounter Continue to avoid mammalian meats. In case of an allergic reaction, give Benadryl 50 mg every 4 hours, and if life-threatening symptoms occur, inject with EpiPen 0.3 mg.  Call the clinic if this treatment plan is not working well for you  Follow up in 6 weeks or sooner if needed   Lifestyle Changes for Controlling GERD  When you have GERD, stomach acid feels as if it's backing up toward your mouth. Whether or not you take medication to control your GERD, your symptoms can often be improved with lifestyle changes.   Raise Your Head  Reflux is more likely to  strike when you're lying down flat, because stomach fluid can  flow backward more easily. Raising the head of your bed 4-6 inches can help. To do this:  Slide blocks or books under the legs at the head of your bed. Or, place a wedge under  the mattress. Many foam stores can make a suitable wedge for you. The wedge  should run from your waist to the top of your head.  Don't just prop your head on several pillows. This increases pressure on your  stomach. It can make GERD worse.  Watch Your Eating Habits Certain foods may increase the acid in your stomach or relax the lower esophageal sphincter, making GERD more likely. It's best to avoid the following:  Coffee, tea, and carbonated drinks (with and without caffeine)  Fatty, fried, or spicy food  Mint, chocolate, onions, and tomatoes  Any other foods that seem to irritate your stomach or cause you pain  Relieve the Pressure  Eat smaller meals, even if you have to eat more often.  Don't lie down right after you eat. Wait a few hours for your stomach to empty.  Avoid tight belts and tight-fitting clothes.  Lose excess weight.  Tobacco and Alcohol  Avoid smoking tobacco and drinking alcohol. They can make GERD symptoms worse.

## 2018-12-09 NOTE — Progress Notes (Addendum)
[address] [place] [address] Dept: [phone]  FOLLOW UP NOTE  Patient ID: Christine [name], female    DOB: [birth year]  Age: 43 y.o. MRN: [id number] Date of Office Visit: 12/09/2018  Assessment  Chief Complaint: Shortness of Breath  HPI Christine [name] is a 43 year old female who presents to the clinic for a sick visit. She was last seen in the clinic on 09/13/2018 by Dr. Beaulah Dinning for evaluation of asthma with acute exacerbation, allergic rhinitis, food allergy to mammalian meats, and reflux. At today's visit, she reports that over the weekend she consumed an excessive amount of alcohol and began vomiting. She reports that the next morning she experienced shortness of breath, wheeze, chest tightness and dry cough which persist today. She is currently taking montelukast 10 mg once a day, using her albuterol inhaler twice a day and occasionally using albuterol via nebulizer with some relief of symptoms. She is not currently using Spiriva. She has used Flovent and Symbicort previously, whenever, she developed thrush with each of these despite compliance with proper administration technique and oral care. Reflux is reported as not well controlled on a regular basis and exacerbated since Saturday night. She is currently taking Prilisec 20 mg twice a day for the last 4 days with some relief. Her current medications are listed in the chart.    Drug Allergies:  Allergies  Allergen Reactions  . Sulfa Antibiotics Hives  . Sulfasalazine Hives  . Doxycycline Hyclate   . Ivp Dye [Iodinated Diagnostic Agents] Hives  . Other     ALPHA GAL - MEAT ALLERGY   . Paxil [Paroxetine Hcl] Other (See Comments)    Made pt very angry   . Phosphorus Diarrhea    Physical Exam: BP 128/82   Pulse 77   Temp 98.3 F (36.8 C) (Oral)   Resp 18   SpO2 97%    Physical Exam Vitals signs reviewed.  Constitutional:      Appearance: Normal appearance. She is well-developed.  HENT:     Head:  Normocephalic and atraumatic.     Right Ear: Tympanic membrane normal.     Left Ear: Tympanic membrane normal.     Nose:     Comments: Bilateral nares erythematous with no nasal drainage noted. Pharynx slightly erythematous with no exudate noted. Ears normal. Eyes normal.  Eyes:     Pupils: Pupils are equal, round, and reactive to light.  Neck:     Musculoskeletal: Normal range of motion and neck supple.  Cardiovascular:     Rate and Rhythm: Normal rate and regular rhythm.     Heart sounds: Normal heart sounds. No murmur.  Pulmonary:     Effort: Pulmonary effort is normal.     Comments: Scattered bilateral wheeze which improved slightly post bronchodilator therapy Musculoskeletal: Normal range of motion.  Skin:    General: Skin is warm and dry.  Neurological:     Mental Status: She is alert and oriented to person, place, and time.  Psychiatric:        Mood and Affect: Mood normal.        Behavior: Behavior normal.        Thought Content: Thought content normal.        Judgment: Judgment normal.     Diagnostics: 3.23, FEV1 2.40. Predicted FVC 3.66, predicted FEV1 2.95. Spirometry is within normal limits. Post bronchodilator spirometry FVC 3.34, FEV1 2.45. Post bronchodilator spirometry is within normal limits with a 2% increase in  FEV1 indicating no significant bronchodilator response.   Assessment and Plan: 1. Moderate persistent asthma with acute exacerbation   2. Gastroesophageal reflux disease, esophagitis presence not specified   3. Seasonal and perennial allergic rhinitis   4. Anaphylactic shock due to food, subsequent encounter   5. Allergic urticaria     Meds ordered this encounter  Medications  . Tiotropium Bromide Monohydrate (SPIRIVA RESPIMAT) 1.25 MCG/ACT AERS    Sig: Inhale 2 puffs into the lungs daily.    Dispense:  4 g    Refill:  5  . pantoprazole (PROTONIX) 40 MG tablet    Sig: Take 1 tablet (40 mg total) by mouth daily.    Dispense:  30 tablet     Refill:  5    Patient Instructions  Asthma with exacerbation Begin Spiriva Respimat 1.25 mcg - 2 puffs once a day to prevent cough or wheeze Prednisone 10 mg tablet. Take 2 tablets twice a day for 3 days, then take 2 tablets once a day for 1 day, then take 1 tablet on the 5th day, then stop. We will get some labs in about 4 weeks to help us determine how to better treat your asthma Continue montelukast 10 mg once a day to prevent cough or wheeze Ventolin 2 puffs twice a day every 4 hours as needed for cough or wheeze or instead albuterol 0.083% one unit dose via nebulizer every 4 hours as needed for cough or wheeze.  You may use Ventolin 2 puffs 5-15 minutes before exercise to decrease cough or wheeze   Reflux Stop Prilosec OTC. Begin Protonix 40 mg once a day for reflux. Take 30 minutes before breakfast Limit caffeine intake including coffee, soda, alcohol, and chocolate If this condition worsens or does not resolve, consider referral to Gastroenterology for further evaluation  Seasonal and perennial allergic rhinitis Continue nasal saline rinses Flonase 2 sprays in each nostril once a day as needed for a stuffy nose Azelastine 2 sprays in each nostril once a day as needed for a sunny nose Guaifenesin [phone] mg twice a day to thin post nasal secretions Cetirizine 10 mg once a day as needed for a runny nose  Anaphylactic shock due to food, subsequent encounter Continue to avoid mammalian meats. In case of an allergic reaction, give Benadryl 50 mg every 4 hours, and if life-threatening symptoms occur, inject with EpiPen 0.3 mg.  Call the clinic if this treatment plan is not working well for you  Follow up in 6 weeks or sooner if needed   Lifestyle Changes for Controlling GERD  When you have GERD, stomach acid feels as if it's backing up toward your mouth. Whether or not you take medication to control your GERD, your symptoms can often be improved with lifestyle changes.   Raise  Your Head  Reflux is more likely to strike when you're lying down flat, because stomach fluid can  flow backward more easily. Raising the head of your bed 4-6 inches can help. To do this:  Slide blocks or books under the legs at the head of your bed. Or, place a wedge under  the mattress. Many foam stores can make a suitable wedge for you. The wedge  should run from your waist to the top of your head.  Don't just prop your head on several pillows. This increases pressure on your  stomach. It can make GERD worse.  Watch Your Eating Habits Certain foods may increase the acid in your stomach or relax the  lower esophageal sphincter, making GERD more likely. It's best to avoid the following:  Coffee, tea, and carbonated drinks (with and without caffeine)  Fatty, fried, or spicy food  Mint, chocolate, onions, and tomatoes  Any other foods that seem to irritate your stomach or cause you pain  Relieve the Pressure  Eat smaller meals, even if you have to eat more often.  Don't lie down right after you eat. Wait a few hours for your stomach to empty.  Avoid tight belts and tight-fitting clothes.  Lose excess weight.  Tobacco and Alcohol  Avoid smoking tobacco and drinking alcohol. They can make GERD symptoms worse.    Return in about 6 weeks (around 01/20/2019), or if symptoms worsen or fail to improve.    Thank you for the opportunity to care for this patient.  Please do not hesitate to contact me with questions.  Thermon LeylandAnne Rafel Garde, FNP [facility]  _________________________________________________  I have provided oversight concerning Christine [name]'s evaluation and treatment of this patient's health issues addressed during today's encounter.  I agree with the assessment and therapeutic plan as outlined in the note.   Signed,   R Jorene Guestarter Bobbitt, MD

## 2018-12-10 ENCOUNTER — Other Ambulatory Visit: Payer: Self-pay | Admitting: *Deleted

## 2018-12-10 ENCOUNTER — Telehealth: Payer: Self-pay | Admitting: Nurse Practitioner

## 2018-12-10 DIAGNOSIS — F411 Generalized anxiety disorder: Secondary | ICD-10-CM

## 2018-12-10 MED ORDER — SERTRALINE HCL 100 MG PO TABS: 100.0000 mg | ORAL_TABLET | Freq: Every day | ORAL | 0 refills | Status: DC

## 2018-12-10 NOTE — Telephone Encounter (Signed)
MMM. NTBS 30 days given 11/01/18

## 2018-12-10 NOTE — Progress Notes (Signed)
Pt has appt 1/29-jhb  #30 refill sent.

## 2018-12-10 NOTE — Telephone Encounter (Signed)
PT is needing a refill on zoloft   PT is scheduled 1/29 w/mmm  [facility]

## 2018-12-10 NOTE — Telephone Encounter (Signed)
Closing encounter Refill sent in another encounter

## 2018-12-15 ENCOUNTER — Other Ambulatory Visit: Payer: Self-pay | Admitting: Allergy and Immunology

## 2018-12-22 ENCOUNTER — Encounter: Payer: Self-pay | Admitting: Nurse Practitioner

## 2018-12-22 ENCOUNTER — Ambulatory Visit: Payer: BLUE CROSS/BLUE SHIELD | Admitting: Nurse Practitioner

## 2018-12-22 VITALS — BP 146/81 | HR 71 | Temp 96.9°F | Ht 63.0 in | Wt 188.0 lb

## 2018-12-22 DIAGNOSIS — F5101 Primary insomnia: Secondary | ICD-10-CM

## 2018-12-22 DIAGNOSIS — J4530 Mild persistent asthma, uncomplicated: Secondary | ICD-10-CM

## 2018-12-22 DIAGNOSIS — M26629 Arthralgia of temporomandibular joint, unspecified side: Secondary | ICD-10-CM | POA: Diagnosis not present

## 2018-12-22 DIAGNOSIS — J4541 Moderate persistent asthma with (acute) exacerbation: Secondary | ICD-10-CM

## 2018-12-22 DIAGNOSIS — K219 Gastro-esophageal reflux disease without esophagitis: Secondary | ICD-10-CM | POA: Diagnosis not present

## 2018-12-22 DIAGNOSIS — R030 Elevated blood-pressure reading, without diagnosis of hypertension: Secondary | ICD-10-CM | POA: Diagnosis not present

## 2018-12-22 DIAGNOSIS — J3089 Other allergic rhinitis: Secondary | ICD-10-CM

## 2018-12-22 DIAGNOSIS — F411 Generalized anxiety disorder: Secondary | ICD-10-CM

## 2018-12-22 DIAGNOSIS — Z6832 Body mass index (BMI) 32.0-32.9, adult: Secondary | ICD-10-CM

## 2018-12-22 DIAGNOSIS — E559 Vitamin D deficiency, unspecified: Secondary | ICD-10-CM

## 2018-12-22 MED ORDER — ZOLPIDEM TARTRATE 10 MG PO TABS: ORAL_TABLET | ORAL | 3 refills | Status: DC

## 2018-12-22 MED ORDER — ALPRAZOLAM 0.25 MG PO TABS: ORAL_TABLET | ORAL | 1 refills | Status: DC

## 2018-12-22 MED ORDER — SERTRALINE HCL 100 MG PO TABS: 100.0000 mg | ORAL_TABLET | Freq: Every day | ORAL | 1 refills | Status: DC

## 2018-12-22 NOTE — Progress Notes (Signed)
Subjective:    Patient ID: Christine MontaneJenny H [name], female    DOB: 05/01/1976, 43 y.o.   MRN: [id number]   Chief Complaint: Medical Management of Chronic Issues   HPI:  1. White coat syndrome with high blood pressure without hypertension Patient blood pressure is always elevated when she comes to office. She says that blood pressure is always good at home.   2. Moderate persistent asthma with acute exacerbation  She takes daily singulair and flovent inhaler. Works well for her  3. Gastroesophageal reflux disease, esophagitis presence not specified  she has recently started on protonix- rx by asthma specialist. Seems yo be working well for her.  4. TMJ pain dysfunction syndrome  Has not been bother her as of late  5. Vitamin D deficiency  Takes vitamind supplemnet daily.  6. Generalized anxiety disorder  She has xanax that she takes about 1 x a month. Usually only takes when her stomach is acting up  7. BMI 32.0-32.9,adult  Weight down 4 lbs since last visit  8. Mild persistent asthma, uncomplicated  Went to asthmas specialist a few weeks ago. Said they may make medication changes at next visit  9. Allergic rhinitis  singulair helps some but asthma specialist told her they may change her to injection but she does not remember the name.  10.    insomnia          Takes 1/2 -1 ambien nightly to sleep. cannot sleep without it            at all. Says she feels rested in mornings now that she has             been taking [place].   Outpatient Encounter Medications as of 12/22/2018  Medication Sig  . albuterol (PROVENTIL) (2.5 MG/3ML) 0.083% nebulizer solution Take 3 mLs (2.5 mg total) by nebulization every 4 (four) hours as needed for wheezing or shortness of breath.  . ALPRAZolam (XANAX) 0.25 MG tablet TAKE 1 TABLET BY MOUTH TWICE A DAY AS NEEDED (NEEDS TO BE SEEN)  . Cholecalciferol (VITAMIN D3) 1000 units CAPS Take by mouth.  . EPINEPHrine (EPIPEN 2-PAK) 0.3 mg/0.3 mL IJ SOAJ injection EpiPen  2-Pak 0.3 mg/0.3 mL injection, auto-injector  . ethynodiol-ethinyl estradiol [place](KELNOR 1/35) 1-35 MG-MCG tablet Take 1 tablet by mouth daily.  . fluticasone (FLOVENT HFA) 110 MCG/ACT inhaler Inhale 2 puffs into the lungs 2 (two) times daily.  . montelukast (SINGULAIR) 10 MG tablet TAKE 1 TABLET BY MOUTH EVERYDAY AT BEDTIME  . omeprazole (PRILOSEC) 20 MG capsule Take 1 capsule (20 mg total) by mouth daily.  . pantoprazole (PROTONIX) 40 MG tablet Take 1 tablet (40 mg total) by mouth daily.  . sertraline (ZOLOFT) 100 MG tablet Take 1 tablet (100 mg total) by mouth daily. (Needs to be seen before next refill)  . Tiotropium Bromide Monohydrate (SPIRIVA RESPIMAT) 1.25 MCG/ACT AERS Inhale 2 puffs into the lungs daily.  . VENTOLIN HFA 108 (90 Base) MCG/ACT inhaler TAKE 2 PUFFS BY MOUTH EVERY 6 HOURS AS NEEDED FOR WHEEZE OR SHORTNESS OF BREATH  . zolpidem (AMBIEN) 10 MG tablet TAKE 1 TABLET BY MOUTH EVERYDAY AT BEDTIME    New complaints: None today  Social history: [place] at [employer] [place]    Review of Systems  Constitutional: Negative.  Negative for activity change and appetite change.  HENT: Negative.   Eyes: Negative for pain.  Respiratory: Negative for shortness of breath.   Cardiovascular: Negative for chest pain, palpitations  and leg swelling.  Gastrointestinal: Negative for abdominal pain.  Endocrine: Negative for polydipsia.  Genitourinary: Negative.   Skin: Negative for rash.  Neurological: Negative for dizziness, weakness and headaches.  Hematological: Does not bruise/bleed easily.  Psychiatric/Behavioral: Negative.   All other systems reviewed and are negative.      Objective:   Physical Exam Vitals signs and nursing note reviewed.  Constitutional:      General: She is not in acute distress.    Appearance: Normal appearance. She is well-developed.  HENT:     Head: Normocephalic.     Nose: Nose normal.  Eyes:     Pupils: Pupils are equal, round, and  reactive to light.  Neck:     Musculoskeletal: Normal range of motion and neck supple.     Vascular: No carotid bruit or JVD.  Cardiovascular:     Rate and Rhythm: Normal rate and regular rhythm.     Heart sounds: Normal heart sounds.  Pulmonary:     Effort: Pulmonary effort is normal. No respiratory distress.     Breath sounds: Normal breath sounds. No wheezing or rales.  Chest:     Chest wall: No tenderness.  Abdominal:     General: Bowel sounds are normal. There is no distension or abdominal bruit.     Palpations: Abdomen is soft. There is no hepatomegaly, splenomegaly, mass or pulsatile mass.     Tenderness: There is no abdominal tenderness.  Musculoskeletal: Normal range of motion.  Lymphadenopathy:     Cervical: No cervical adenopathy.  Skin:    General: Skin is warm and dry.  Neurological:     Mental Status: She is alert and oriented to person, place, and time.     Deep Tendon Reflexes: Reflexes are normal and symmetric.  Psychiatric:        Behavior: Behavior normal.        Thought Content: Thought content normal.        Judgment: Judgment normal.    BP (!) 146/81   Pulse 71   Temp (!) 96.9 F (36.1 C) (Oral)   Ht 5\' 3"  (1.6 m)   Wt 188 lb (85.3 kg)   BMI 33.30 kg/m        Assessment & Plan:  Christine [name] comes in today with chief complaint of Medical Management of Chronic Issues   Diagnosis and orders addressed:  1. White coat syndrome with high blood pressure without hypertension Continue to keep diary of blood sugars at home.  2. Moderate persistent asthma with acute exacerbation Keep follow up with asthma specialist  3. Gastroesophageal reflux disease, esophagitis presence not specified Avoid spicy foods Do not eat 2 hours prior to bedtime  4. TMJ pain dysfunction syndrome  5. Vitamin D deficiency Continue daily vitamin d supplement  6. Generalized anxiety disorder Stress management - sertraline (ZOLOFT) 100 MG tablet; Take 1 tablet (100  mg total) by mouth daily. (Needs to be seen before next refill)  Dispense: 90 tablet; Refill: 1 - ALPRAZolam (XANAX) 0.25 MG tablet; TAKE 1 TABLET BY MOUTH TWICE A DAY AS NEEDED (NEEDS TO BE SEEN)  Dispense: 20 tablet; Refill: 1  7. BMI 32.0-32.9,adult Discussed diet and exercise for person with BMI >25 Will recheck weight in 3-6 months  8. Mild persistent asthma, uncomplicated  9. Allergic rhinitis  10. Primary insomnia Bedtime routine - zolpidem (AMBIEN) 10 MG tablet; TAKE 1 TABLET BY MOUTH EVERYDAY AT BEDTIME  Dispense: 30 tablet; Refill: 3   Labs pending  Health Maintenance reviewed Diet and exercise encouraged  Follow up plan: 6 months   [name], FNP

## 2018-12-22 NOTE — Patient Instructions (Signed)

## 2019-01-10 ENCOUNTER — Other Ambulatory Visit: Payer: Self-pay | Admitting: Allergy

## 2019-01-10 ENCOUNTER — Telehealth: Payer: Self-pay | Admitting: Allergy

## 2019-01-10 MED ORDER — PREDNISONE 10 MG PO TABS: ORAL_TABLET | ORAL | 0 refills | Status: DC

## 2019-01-10 NOTE — Telephone Encounter (Signed)
Please advise 

## 2019-01-10 NOTE — Telephone Encounter (Signed)
Done

## 2019-01-10 NOTE — Telephone Encounter (Signed)
Patient reports nasal congestion that began on Saturday with some clear to pale yellow nasal drainage. She denies fever and is using Sudafed and Flonase which are not providing any relief at this time. She denies shortness of breath or wheeze. Cough is productive with clear sputum.She will take prednisone 10 mg twice a day for 4 days, then 10 mg on the 5th day, then stop. She understands to continue her nasal sprays, montelukast, Flovent, omeprazole, and nasal sprays. She will call the clinic with fever, symptoms worsening or not improving.

## 2019-01-10 NOTE — Telephone Encounter (Signed)
PT called to see if [facility] refilled prednisone per Lindas previous message.

## 2019-01-10 NOTE — Telephone Encounter (Signed)
Please call in a baby pack for this patient. Thank you

## 2019-01-10 NOTE — Telephone Encounter (Signed)
Called in Prednisone 10 mg twice a day for 4 days, then 10 mg on the 5th day, then stop.  Patient notified.

## 2019-01-10 NOTE — Telephone Encounter (Signed)
Patient called said the prednisone helped for a little while. Coughing up some. Nose is green sometime yellow. . No fever. All across sinuses. Fells like it is settling in chest. You seen her a couple weeks ago. Please advise.

## 2019-02-17 ENCOUNTER — Other Ambulatory Visit: Payer: Self-pay | Admitting: Allergy and Immunology

## 2019-05-10 ENCOUNTER — Other Ambulatory Visit: Payer: Self-pay | Admitting: Nurse Practitioner

## 2019-05-10 DIAGNOSIS — J4531 Mild persistent asthma with (acute) exacerbation: Secondary | ICD-10-CM

## 2019-05-16 ENCOUNTER — Other Ambulatory Visit: Payer: Self-pay | Admitting: Allergy and Immunology

## 2019-05-16 ENCOUNTER — Other Ambulatory Visit: Payer: Self-pay

## 2019-05-17 ENCOUNTER — Encounter: Payer: Self-pay | Admitting: Nurse Practitioner

## 2019-05-17 ENCOUNTER — Ambulatory Visit: Payer: BC Managed Care – PPO | Admitting: Nurse Practitioner

## 2019-05-17 ENCOUNTER — Other Ambulatory Visit: Payer: Self-pay

## 2019-05-17 VITALS — BP 138/89 | HR 87 | Temp 98.1°F | Ht 63.0 in | Wt 180.0 lb

## 2019-05-17 DIAGNOSIS — K219 Gastro-esophageal reflux disease without esophagitis: Secondary | ICD-10-CM

## 2019-05-17 DIAGNOSIS — R030 Elevated blood-pressure reading, without diagnosis of hypertension: Secondary | ICD-10-CM | POA: Diagnosis not present

## 2019-05-17 DIAGNOSIS — J4531 Mild persistent asthma with (acute) exacerbation: Secondary | ICD-10-CM

## 2019-05-17 DIAGNOSIS — Z6832 Body mass index (BMI) 32.0-32.9, adult: Secondary | ICD-10-CM

## 2019-05-17 DIAGNOSIS — F5101 Primary insomnia: Secondary | ICD-10-CM

## 2019-05-17 DIAGNOSIS — E559 Vitamin D deficiency, unspecified: Secondary | ICD-10-CM

## 2019-05-17 DIAGNOSIS — F411 Generalized anxiety disorder: Secondary | ICD-10-CM | POA: Diagnosis not present

## 2019-05-17 DIAGNOSIS — J4530 Mild persistent asthma, uncomplicated: Secondary | ICD-10-CM

## 2019-05-17 MED ORDER — ETHYNODIOL DIAC-ETH ESTRADIOL 1-35 MG-MCG PO TABS: 1.0000 | ORAL_TABLET | Freq: Every day | ORAL | 5 refills | Status: DC

## 2019-05-17 MED ORDER — ZOLPIDEM TARTRATE 10 MG PO TABS: ORAL_TABLET | ORAL | 3 refills | Status: DC

## 2019-05-17 MED ORDER — PANTOPRAZOLE SODIUM 40 MG PO TBEC: 40.0000 mg | DELAYED_RELEASE_TABLET | Freq: Every day | ORAL | 5 refills | Status: DC

## 2019-05-17 MED ORDER — MONTELUKAST SODIUM 10 MG PO TABS: ORAL_TABLET | ORAL | 1 refills | Status: DC

## 2019-05-17 MED ORDER — ALPRAZOLAM 0.25 MG PO TABS: ORAL_TABLET | ORAL | 1 refills | Status: DC

## 2019-05-17 MED ORDER — SPIRIVA RESPIMAT 1.25 MCG/ACT IN AERS: 2.0000 | INHALATION_SPRAY | Freq: Every day | RESPIRATORY_TRACT | 5 refills | Status: DC

## 2019-05-17 MED ORDER — SERTRALINE HCL 100 MG PO TABS: 100.0000 mg | ORAL_TABLET | Freq: Every day | ORAL | 1 refills | Status: DC

## 2019-05-17 NOTE — Progress Notes (Deleted)
  Chief Complaint: Medical Management of Chronic Issues    HPI:  1. White coat syndrome with high blood pressure without hypertension Patients blood pressure is always high in office. When she checks It outside of office it runs around [phone] systolic.  2. Gastroesophageal reflux disease, esophagitis presence not specified She takes protonix daily and it works well to keep symptoms under control.  3. Generalized anxiety disorder Patient says she can usually deal with her anxiety. Only takes xanax 1-2 x a month.  4. Vitamin D deficiency Takes daily vitamin d supplement  5. BMI 32.0-32.9,adult Weight is down 8lbs  6. Mild persistent asthma, uncomplicated Takes daily singulair. The weather affects her.  7. Primary insomnia Takes ambien to sleep.is unable to sleep without taking.    Outpatient Encounter Medications as of 05/17/2019  Medication Sig  . albuterol (PROVENTIL) (2.5 MG/3ML) 0.083% nebulizer solution Take 3 mLs (2.5 mg total) by nebulization every 4 (four) hours as needed for wheezing or shortness of breath.  . ALPRAZolam (XANAX) 0.25 MG tablet TAKE 1 TABLET BY MOUTH TWICE A DAY AS NEEDED (NEEDS TO BE SEEN)  . Cholecalciferol (VITAMIN D3) 1000 units CAPS Take by mouth.  . EPINEPHrine (EPIPEN 2-PAK) 0.3 mg/0.3 mL IJ SOAJ injection EpiPen 2-Pak 0.3 mg/0.3 mL injection, auto-injector  . ethynodiol-ethinyl estradiol [place] Coots 1/35) 1-35 MG-MCG tablet Take 1 tablet by mouth daily.  . fluticasone (FLOVENT HFA) 110 MCG/ACT inhaler Inhale 2 puffs into the lungs 2 (two) times daily.  . montelukast (SINGULAIR) 10 MG tablet TAKE 1 TABLET BY MOUTH EVERYDAY AT BEDTIME  . pantoprazole (PROTONIX) 40 MG tablet Take 1 tablet (40 mg total) by mouth daily.  . sertraline (ZOLOFT) 100 MG tablet Take 1 tablet (100 mg total) by mouth daily. (Needs to be seen before next refill)  . Tiotropium Bromide Monohydrate (SPIRIVA RESPIMAT) 1.25 MCG/ACT AERS Inhale 2 puffs into the lungs daily.  . VENTOLIN  HFA 108 (90 Base) MCG/ACT inhaler TAKE 2 PUFFS BY MOUTH EVERY 6 HOURS AS NEEDED FOR WHEEZE OR SHORTNESS OF BREATH  . zolpidem (AMBIEN) 10 MG tablet TAKE 1 TABLET BY MOUTH EVERYDAY AT BEDTIME  . [DISCONTINUED] omeprazole (PRILOSEC) 20 MG capsule Take 1 capsule (20 mg total) by mouth daily.  . [DISCONTINUED] predniSONE (DELTASONE) 10 MG tablet Take one tablet twice a day x 4 days, then one tablet on 5th day, then stop.    Past Surgical History:  Procedure Laterality Date  . BREAST BIOPSY Right 07/2015   fibrocystic changes  . CESAREAN SECTION    . TONSILLECTOMY AND ADENOIDECTOMY      Family History  Problem Relation Age of Onset  . Diabetes Mother   . Hypertension Mother   . Hyperlipidemia Father   . Hypertension Father   . Allergic rhinitis Neg Hx   . Angioedema Neg Hx   . Asthma Neg Hx   . Eczema Neg Hx   . Immunodeficiency Neg Hx   . Urticaria Neg Hx     New complaints: She is currently on birth control and no longer wants to go to [facility] office for check ups. Last PAP was done several years ago.   Social history: Lives with husband and is currently remodeling her house.

## 2019-05-17 NOTE — Patient Instructions (Signed)

## 2019-05-17 NOTE — Progress Notes (Signed)
Subjective:    Patient ID: Christine MontaneJenny H [name], female    DOB: 07/05/1976, 43 y.o.   MRN: [id number]  Chief Complaint: Medical Management of Chronic Issues    HPI:  1. White coat syndrome with high blood pressure without hypertension Patients blood pressure is always high in office. When she checks It outside of office it runs around 120-130 systolic.  2. Gastroesophageal reflux disease, esophagitis presence not specified She takes protonix daily and it works well to keep symptoms under control.  3. Generalized anxiety disorder Patient says she can usually deal with her anxiety. Only takes xanax 1-2 x a month. Takes zoloft daily  4. Vitamin D deficiency Takes daily vitamin d supplement  5. BMI 32.0-32.9,adult Weight is down 8lbs  6. Mild persistent asthma, uncomplicated Takes daily singulair. The weather affects her.  7. Primary insomnia Takes ambien to sleep.is unable to sleep without taking.    Outpatient Encounter Medications as of 05/17/2019  Medication Sig  . albuterol (PROVENTIL) (2.5 MG/3ML) 0.083% nebulizer solution Take 3 mLs (2.5 mg total) by nebulization every 4 (four) hours as needed for wheezing or shortness of breath.  . ALPRAZolam (XANAX) 0.25 MG tablet TAKE 1 TABLET BY MOUTH TWICE A DAY AS NEEDED (NEEDS TO BE SEEN)  . Cholecalciferol (VITAMIN D3) 1000 units CAPS Take by mouth.  . EPINEPHrine (EPIPEN 2-PAK) 0.3 mg/0.3 mL IJ SOAJ injection EpiPen 2-Pak 0.3 mg/0.3 mL injection, auto-injector  . ethynodiol-ethinyl estradiol [place](KELNOR 1/35) 1-35 MG-MCG tablet Take 1 tablet by mouth daily.  . fluticasone (FLOVENT HFA) 110 MCG/ACT inhaler Inhale 2 puffs into the lungs 2 (two) times daily.  . montelukast (SINGULAIR) 10 MG tablet TAKE 1 TABLET BY MOUTH EVERYDAY AT BEDTIME  . pantoprazole (PROTONIX) 40 MG tablet Take 1 tablet (40 mg total) by mouth daily.  . sertraline (ZOLOFT) 100 MG tablet Take 1 tablet (100 mg total) by mouth daily. (Needs to be seen before next refill)   . Tiotropium Bromide Monohydrate (SPIRIVA RESPIMAT) 1.25 MCG/ACT AERS Inhale 2 puffs into the lungs daily.  . VENTOLIN HFA 108 (90 Base) MCG/ACT inhaler TAKE 2 PUFFS BY MOUTH EVERY 6 HOURS AS NEEDED FOR WHEEZE OR SHORTNESS OF BREATH  . zolpidem (AMBIEN) 10 MG tablet TAKE 1 TABLET BY MOUTH EVERYDAY AT BEDTIME  . [DISCONTINUED] omeprazole (PRILOSEC) 20 MG capsule Take 1 capsule (20 mg total) by mouth daily.  . [DISCONTINUED] predniSONE (DELTASONE) 10 MG tablet Take one tablet twice a day x 4 days, then one tablet on 5th day, then stop.    Past Surgical History:  Procedure Laterality Date  . BREAST BIOPSY Right 07/2015   fibrocystic changes  . CESAREAN SECTION    . TONSILLECTOMY AND ADENOIDECTOMY      Family History  Problem Relation Age of Onset  . Diabetes Mother   . Hypertension Mother   . Hyperlipidemia Father   . Hypertension Father   . Allergic rhinitis Neg Hx   . Angioedema Neg Hx   . Asthma Neg Hx   . Eczema Neg Hx   . Immunodeficiency Neg Hx   . Urticaria Neg Hx     New complaints: She is currently on birth control and no longer [facility]. Last PAP was done several years ago.   Social history: Lives with husband and is currently remodeling her house.     Review of Systems  Constitutional: Negative for activity change and appetite change.  HENT: Negative.   Eyes: Negative  for pain.  Respiratory: Negative for shortness of breath.   Cardiovascular: Negative for chest pain, palpitations and leg swelling.  Gastrointestinal: Negative for abdominal pain.  Endocrine: Negative for polydipsia.  Genitourinary: Negative.   Skin: Negative for rash.  Neurological: Negative for dizziness, weakness and headaches.  Hematological: Does not bruise/bleed easily.  Psychiatric/Behavioral: Negative.   All other systems reviewed and are negative.      Objective:   Physical Exam Vitals signs and nursing note reviewed.  Constitutional:       General: She is not in acute distress.    Appearance: Normal appearance. She is well-developed.  HENT:     Head: Normocephalic.     Nose: Nose normal.  Eyes:     Pupils: Pupils are equal, round, and reactive to light.  Neck:     Musculoskeletal: Normal range of motion and neck supple.     Vascular: No carotid bruit or JVD.  Cardiovascular:     Rate and Rhythm: Normal rate and regular rhythm.     Heart sounds: Normal heart sounds.  Pulmonary:     Effort: Pulmonary effort is normal. No respiratory distress.     Breath sounds: Normal breath sounds. No wheezing or rales.  Chest:     Chest wall: No tenderness.  Abdominal:     General: Bowel sounds are normal. There is no distension or abdominal bruit.     Palpations: Abdomen is soft. There is no hepatomegaly, splenomegaly, mass or pulsatile mass.     Tenderness: There is no abdominal tenderness.  Musculoskeletal: Normal range of motion.  Lymphadenopathy:     Cervical: No cervical adenopathy.  Skin:    General: Skin is warm and dry.  Neurological:     Mental Status: She is alert and oriented to person, place, and time.     Deep Tendon Reflexes: Reflexes are normal and symmetric.  Psychiatric:        Behavior: Behavior normal.        Thought Content: Thought content normal.        Judgment: Judgment normal.    BP 138/89   Pulse 87   Temp 98.1 F (36.7 C) (Oral)   Ht 5\' 3"  (1.6 m)   Wt 180 lb (81.6 kg)   BMI 31.89 kg/m        Assessment & Plan:  Christine [name] comes in today with chief complaint of Medical Management of Chronic Issues   Diagnosis and orders addressed:  1. White coat syndrome with high blood pressure without hypertension Keep diary of blood pressure at home  2. Gastroesophageal reflux disease, esophagitis presence not specified Avoid spicy foods Do not eat 2 hours prior to bedtime - pantoprazole (PROTONIX) 40 MG tablet; Take 1 tablet (40 mg total) by mouth daily.  Dispense: 30 tablet; Refill: 5   3. Generalized anxiety disorder Stress management - sertraline (ZOLOFT) 100 MG tablet; Take 1 tablet (100 mg total) by mouth daily. (Needs to be seen before next refill)  Dispense: 90 tablet; Refill: 1 - ALPRAZolam (XANAX) 0.25 MG tablet; TAKE 1 TABLET BY MOUTH TWICE A DAY AS NEEDED (NEEDS TO BE SEEN)  Dispense: 20 tablet; Refill: 1  4. Vitamin D deficiency contineu daily vitamin d supplement  5. BMI 32.0-32.9,adult Discussed diet and exercise for person with BMI >25 Will recheck weight in 3-6 months  6. Mild persistent asthma, uncomplicated - Tiotropium Bromide Monohydrate (SPIRIVA RESPIMAT) 1.25 MCG/ACT AERS; Inhale 2 puffs into the lungs daily.  Dispense: 4 g;  Refill: 5 - ethynodiol-ethinyl estradiol [place](KELNOR 1/35) 1-35 MG-MCG tablet; Take 1 tablet by mouth daily.  Dispense: 1 Package; Refill: 5 - montelukast (SINGULAIR) 10 MG tablet; TAKE 1 TABLET BY MOUTH EVERYDAY AT BEDTIME  Dispense: 90 tablet; Refill: 1  7. Primary insomnia Bedtime routine - zolpidem (AMBIEN) 10 MG tablet; TAKE 1 TABLET BY MOUTH EVERYDAY AT BEDTIME  Dispense: 30 tablet; Refill: 3      Health Maintenance reviewed Diet and exercise encouraged  Follow up plan: 6 months   Mary-Margaret Daphine DeutscherMartin, FNP

## 2019-06-05 ENCOUNTER — Other Ambulatory Visit: Payer: Self-pay | Admitting: Allergy and Immunology

## 2019-08-12 ENCOUNTER — Encounter: Payer: Self-pay | Admitting: Nurse Practitioner

## 2019-08-12 ENCOUNTER — Ambulatory Visit (INDEPENDENT_AMBULATORY_CARE_PROVIDER_SITE_OTHER): Payer: BC Managed Care – PPO | Admitting: Nurse Practitioner

## 2019-08-12 DIAGNOSIS — K581 Irritable bowel syndrome with constipation: Secondary | ICD-10-CM

## 2019-08-12 DIAGNOSIS — R030 Elevated blood-pressure reading, without diagnosis of hypertension: Secondary | ICD-10-CM | POA: Diagnosis not present

## 2019-08-12 DIAGNOSIS — J4530 Mild persistent asthma, uncomplicated: Secondary | ICD-10-CM

## 2019-08-12 DIAGNOSIS — F411 Generalized anxiety disorder: Secondary | ICD-10-CM | POA: Diagnosis not present

## 2019-08-12 DIAGNOSIS — K219 Gastro-esophageal reflux disease without esophagitis: Secondary | ICD-10-CM

## 2019-08-12 DIAGNOSIS — Z6832 Body mass index (BMI) 32.0-32.9, adult: Secondary | ICD-10-CM

## 2019-08-12 DIAGNOSIS — E559 Vitamin D deficiency, unspecified: Secondary | ICD-10-CM

## 2019-08-12 DIAGNOSIS — J4531 Mild persistent asthma with (acute) exacerbation: Secondary | ICD-10-CM

## 2019-08-12 DIAGNOSIS — F5101 Primary insomnia: Secondary | ICD-10-CM

## 2019-08-12 MED ORDER — DICYCLOMINE HCL 10 MG PO CAPS: 10.0000 mg | ORAL_CAPSULE | Freq: Three times a day (TID) | ORAL | 2 refills | Status: DC

## 2019-08-12 MED ORDER — MONTELUKAST SODIUM 10 MG PO TABS: ORAL_TABLET | ORAL | 1 refills | Status: DC

## 2019-08-12 MED ORDER — PANTOPRAZOLE SODIUM 40 MG PO TBEC: 40.0000 mg | DELAYED_RELEASE_TABLET | Freq: Every day | ORAL | 5 refills | Status: DC

## 2019-08-12 MED ORDER — ALPRAZOLAM 0.25 MG PO TABS: ORAL_TABLET | ORAL | 1 refills | Status: DC

## 2019-08-12 MED ORDER — ZOLPIDEM TARTRATE 10 MG PO TABS: ORAL_TABLET | ORAL | 3 refills | Status: DC

## 2019-08-12 MED ORDER — SERTRALINE HCL 100 MG PO TABS: 100.0000 mg | ORAL_TABLET | Freq: Every day | ORAL | 1 refills | Status: DC

## 2019-08-12 NOTE — Progress Notes (Signed)
Virtual Visit via telephone Note Due to COVID-19 pandemic this visit was conducted virtually. This visit type was conducted due to national recommendations for restrictions regarding the COVID-19 Pandemic (e.g. social distancing, sheltering in place) in an effort to limit this patient's exposure and mitigate transmission in our community. All issues noted in this document were discussed and addressed.  A physical exam was not performed with this format.  I connected with Christine [name] on 08/12/19 at 8:25 by telephone and verified that I am speaking with the correct person using two identifiers. Christine [name] is currently located at home and no one is currently with her during visit. The provider, [name], FNP is located in their office at time of visit.  I discussed the limitations, risks, security and privacy concerns of performing an evaluation and management service by telephone and the availability of in person appointments. I also discussed with the patient that there may be a patient responsible charge related to this service. The patient expressed understanding and agreed to proceed.   History and Present Illness:   Chief Complaint: Medical Management of Chronic Issues    HPI:  1. White coat syndrome with high blood pressure without hypertension Patient has not been checking blood pressure at home lately. When she use to check it at home it was always normal. BP Readings from Last 3 Encounters:  05/17/19 138/89  12/22/18 (!) 146/81  12/09/18 128/82    2. Generalized anxiety disorder She is currently on zoloft and xanax. Her anxiety has been up some due to not having a job currently. GAD 7 : Generalized Anxiety Score 08/12/2019 02/17/2017 09/18/2016 08/19/2016  Nervous, Anxious, on Edge 2 1 0 3  Control/stop worrying 1 0 1 2  Worry too much - different things 1 1 0 3  Trouble relaxing 2 1 1 2   Restless 1 0 0 0  Easily annoyed or irritable 1 1 1  0  Afraid -  awful might happen 0 0 0 2  Total GAD 7 Score 8 4 3 12   Anxiety Difficulty Somewhat difficult Not difficult at all Not difficult at all Not difficult at all     3. Gastroesophageal reflux disease, esophagitis presence not specified Is on protonix dialy and is doing well  4. Mild persistent asthma, uncomplicated She has been doing well as long as she stayed out of the heat  5. Vitamin D deficiency Is on daily vitamin d supplement  6. Insomnia talea ambien to sleep at night. Does not have to take every night but needs at 4 nights a week  7. BMI 32.0-32.9,adult Weight is down but not sure how much Wt Readings from Last 3 Encounters:  05/17/19 180 lb (81.6 kg)  12/22/18 188 lb (85.3 kg)  09/13/18 191 lb 12.8 oz (87 kg)   BMI Readings from Last 3 Encounters:  05/17/19 31.89 kg/m  12/22/18 33.30 kg/m  09/13/18 33.13 kg/m      Outpatient Encounter Medications as of 08/12/2019  Medication Sig  . albuterol (PROVENTIL) (2.5 MG/3ML) 0.083% nebulizer solution Take 3 mLs (2.5 mg total) by nebulization every 4 (four) hours as needed for wheezing or shortness of breath.  . ALPRAZolam (XANAX) 0.25 MG tablet TAKE 1 TABLET BY MOUTH TWICE A DAY AS NEEDED (NEEDS TO BE SEEN)  . Cholecalciferol (VITAMIN D3) 1000 units CAPS Take by mouth.  . EPINEPHrine (EPIPEN 2-PAK) 0.3 mg/0.3 mL IJ SOAJ injection EpiPen 2-Pak 0.3 mg/0.3 mL injection, auto-injector  . ethynodiol-ethinyl estradiol [facility]  1/35) 1-35 MG-MCG tablet Take 1 tablet by mouth daily.  . fluticasone (FLOVENT HFA) 110 MCG/ACT inhaler Inhale 2 puffs into the lungs 2 (two) times daily.  . montelukast (SINGULAIR) 10 MG tablet TAKE 1 TABLET BY MOUTH EVERYDAY AT BEDTIME  . pantoprazole (PROTONIX) 40 MG tablet Take 1 tablet (40 mg total) by mouth daily.  . sertraline (ZOLOFT) 100 MG tablet Take 1 tablet (100 mg total) by mouth daily. (Needs to be seen before next refill)  . Tiotropium Bromide Monohydrate (SPIRIVA RESPIMAT) 1.25 MCG/ACT AERS  Inhale 2 puffs into the lungs daily.  . VENTOLIN HFA 108 (90 Base) MCG/ACT inhaler TAKE 2 PUFFS BY MOUTH EVERY 6 HOURS AS NEEDED FOR WHEEZE OR SHORTNESS OF BREATH  . zolpidem (AMBIEN) 10 MG tablet TAKE 1 TABLET BY MOUTH EVERYDAY AT BEDTIME     Past Surgical History:  Procedure Laterality Date  . BREAST BIOPSY Right 07/2015   fibrocystic changes  . CESAREAN SECTION    . TONSILLECTOMY AND ADENOIDECTOMY      Family History  Problem Relation Age of Onset  . Diabetes Mother   . Hypertension Mother   . Hyperlipidemia Father   . Hypertension Father   . Allergic rhinitis Neg Hx   . Angioedema Neg Hx   . Asthma Neg Hx   . Eczema Neg Hx   . Immunodeficiency Neg Hx   . Urticaria Neg Hx     New complaints: Is currently having a flare up of IBS. And is having lots of cramping.  Social history: Is still currently looking for a job.  Controlled substance contract: will do at next face to face visit     Review of Systems  Constitutional: Negative for diaphoresis and weight loss.  Eyes: Negative for blurred vision, double vision and pain.  Respiratory: Negative for shortness of breath.   Cardiovascular: Negative for chest pain, palpitations, orthopnea and leg swelling.  Gastrointestinal: Negative for abdominal pain.  Skin: Negative for rash.  Neurological: Negative for dizziness, sensory change, loss of consciousness, weakness and headaches.  Endo/Heme/Allergies: Negative for polydipsia. Does not bruise/bleed easily.  Psychiatric/Behavioral: Positive for depression. Negative for memory loss. The patient does not have insomnia.   All other systems reviewed and are negative.    Observations/Objective: Alert and oriented- answers all questions appropriately No distress Mild depression   Assessment and Plan: Christine [name] comes in today with chief complaint of Medical Management of Chronic Issues   Diagnosis and orders addressed:  1. White coat syndrome with high blood  pressure without hypertension Please check blood pressure occasionally at home  2. Generalized anxiety disorder Stress management - sertraline (ZOLOFT) 100 MG tablet; Take 1 tablet (100 mg total) by mouth daily. (Needs to be seen before next refill)  Dispense: 90 tablet; Refill: 1 - ALPRAZolam (XANAX) 0.25 MG tablet; TAKE 1 TABLET BY MOUTH TWICE A DAY AS NEEDED (NEEDS TO BE SEEN)  Dispense: 20 tablet; Refill: 1  3. Gastroesophageal reflux disease, esophagitis presence not specified Avoid spicy foods Do not eat 2 hours prior to bedtime - pantoprazole (PROTONIX) 40 MG tablet; Take 1 tablet (40 mg total) by mouth daily.  Dispense: 30 tablet; Refill: 5  4. Mild persistent asthma, uncomplicated Call if has flare up  5. Vitamin D deficiency Continue vitamin d suplements  6. BMI 32.0-32.9,adult Discussed diet and exercise for person with BMI >25 Will recheck weight in 3-6 months  7. Mild persistent asthma with acute exacerbation - montelukast (SINGULAIR) 10 MG tablet; TAKE  1 TABLET BY MOUTH EVERYDAY AT BEDTIME  Dispense: 90 tablet; Refill: 1  8. Primary insomnia Bedtime routine - zolpidem (AMBIEN) 10 MG tablet; TAKE 1 TABLET BY MOUTH EVERYDAY AT BEDTIME  Dispense: 30 tablet; Refill: 3  9. Irritable bowel syndrome with constipation Watch diet  And avoid foods that cause flareup - dicyclomine (BENTYL) 10 MG capsule; Take 1 capsule (10 mg total) by mouth 4 (four) times daily -  before meals and at bedtime.  Dispense: 30 capsule; Refill: 2   Previous labs reviewed Health Maintenance reviewed Diet and exercise encouraged  Follow up plan: 3 months     I discussed the assessment and treatment plan with the patient. The patient was provided an opportunity to ask questions and all were answered. The patient agreed with the plan and demonstrated an understanding of the instructions.   The patient was advised to call back or seek an in-person evaluation if the symptoms worsen or if the  condition fails to improve as anticipated.  The above assessment and management plan was discussed with the patient. The patient verbalized understanding of and has agreed to the management plan. Patient is aware to call the clinic if symptoms persist or worsen. Patient is aware when to return to the clinic for a follow-up visit. Patient educated on when it is appropriate to go to the emergency department.   Time call ended:  8:40  I provided 15 minutes of non-face-to-face time during this encounter.    Mary-Margaret Daphine DeutscherMartin, FNP

## 2019-08-19 ENCOUNTER — Telehealth: Payer: Self-pay | Admitting: Nurse Practitioner

## 2019-08-22 DIAGNOSIS — Z20828 Contact with and (suspected) exposure to other viral communicable diseases: Secondary | ICD-10-CM | POA: Diagnosis not present

## 2019-08-22 NOTE — Telephone Encounter (Signed)
Left message to call back  

## 2019-08-29 ENCOUNTER — Other Ambulatory Visit: Payer: Self-pay | Admitting: Allergy and Immunology

## 2019-09-22 ENCOUNTER — Other Ambulatory Visit: Payer: Self-pay | Admitting: Allergy and Immunology

## 2019-10-07 ENCOUNTER — Encounter: Payer: Self-pay | Admitting: Nurse Practitioner

## 2019-10-07 ENCOUNTER — Ambulatory Visit: Payer: BC Managed Care – PPO | Admitting: Nurse Practitioner

## 2019-10-07 ENCOUNTER — Other Ambulatory Visit: Payer: Self-pay

## 2019-10-07 VITALS — BP 147/91 | HR 74 | Temp 99.3°F | Ht 63.0 in | Wt 160.2 lb

## 2019-10-07 DIAGNOSIS — Z0000 Encounter for general adult medical examination without abnormal findings: Secondary | ICD-10-CM | POA: Diagnosis not present

## 2019-10-07 DIAGNOSIS — F411 Generalized anxiety disorder: Secondary | ICD-10-CM

## 2019-10-07 DIAGNOSIS — Z01411 Encounter for gynecological examination (general) (routine) with abnormal findings: Secondary | ICD-10-CM

## 2019-10-07 DIAGNOSIS — F5101 Primary insomnia: Secondary | ICD-10-CM

## 2019-10-07 DIAGNOSIS — Z0001 Encounter for general adult medical examination with abnormal findings: Secondary | ICD-10-CM

## 2019-10-07 DIAGNOSIS — Z01419 Encounter for gynecological examination (general) (routine) without abnormal findings: Secondary | ICD-10-CM | POA: Diagnosis not present

## 2019-10-07 MED ORDER — ALPRAZOLAM 0.25 MG PO TABS: ORAL_TABLET | ORAL | 1 refills | Status: DC

## 2019-10-07 MED ORDER — AMITRIPTYLINE HCL 25 MG PO TABS: 25.0000 mg | ORAL_TABLET | Freq: Every day | ORAL | 3 refills | Status: DC

## 2019-10-07 NOTE — Progress Notes (Signed)
 Subjective:    Patient ID: Christine Christine [name], female    DOB: 10/04/1976, 43 y.o.   MRN: [id number]   Chief Complaint: Gynecologic Exam   HPI Patient comes in tday for annual physical exam with PAP. She had a televisit on 08/12/19 for medical management of chronic issues, at which tome no changes were made to plan of care. She is c/o anxiety and depression. Christine [name] 7 : Generalized Anxiety Score 08/12/2019 02/17/2017 09/18/2016 08/19/2016  Nervous, Anxious, on Edge 2 1 0 3  Control/stop worrying 1 0 1 2  Worry too much - different things 1 1 0 3  Trouble relaxing 2 1 1 2  Restless 1 0 0 0  Easily annoyed or irritable 1 1 1 0  Afraid - awful might happen 0 0 0 2  Total Christine [name] 7 Score 8 4 3 12  Anxiety Difficulty Somewhat difficult Not difficult at all Not difficult at all Not difficult at all    Depression screen PHQ 2/9 10/07/2019 08/12/2019 05/17/2019  Decreased Interest 0 1 0  Down, Depressed, Hopeless 0 1 0  PHQ - 2 Score 0 2 0  Altered sleeping - 0 -  Tired, decreased energy - 0 -  Change in appetite - 0 -  Feeling bad or failure about yourself  - 0 -  Trouble concentrating - 0 -  Moving slowly or fidgety/restless - 0 -  Suicidal thoughts - 0 -  PHQ-9 Score - 2 -  Difficult doing work/chores - Somewhat difficult -  Some recent data might be hidden       Review of Systems  Constitutional: Negative for activity change and appetite change.  HENT: Negative.   Eyes: Negative for pain.  Respiratory: Negative for shortness of breath.   Cardiovascular: Negative for chest pain, palpitations and leg swelling.  Gastrointestinal: Negative for abdominal pain.  Endocrine: Negative for polydipsia.  Genitourinary: Negative.   Skin: Negative for rash.  Neurological: Negative for dizziness, weakness and headaches.  Hematological: Does not bruise/bleed easily.  Psychiatric/Behavioral: Negative.   All other systems reviewed and are negative.      Objective:   Physical Exam Vitals signs  and nursing note reviewed.  Constitutional:      General: She is not in acute distress.    Appearance: Normal appearance. She is well-developed.  HENT:     Head: Normocephalic.     Nose: Nose normal.  Eyes:     Pupils: Pupils are equal, round, and reactive to light.  Neck:     Musculoskeletal: Normal range of motion and neck supple.     Vascular: No carotid bruit or JVD.  Cardiovascular:     Rate and Rhythm: Normal rate and regular rhythm.     Heart sounds: Normal heart sounds.  Pulmonary:     Effort: Pulmonary effort is normal. No respiratory distress.     Breath sounds: Normal breath sounds. No wheezing or rales.  Chest:     Chest wall: No tenderness.  Abdominal:     General: Bowel sounds are normal. There is no distension or abdominal bruit.     Palpations: Abdomen is soft. There is no hepatomegaly, splenomegaly, mass or pulsatile mass.     Tenderness: There is no abdominal tenderness.  Genitourinary:    General: Normal vulva.     Vagina: No vaginal discharge.     Rectum: Normal.     Comments: cervix nonparous an pink No adnexal masses or tenderness Musculoskeletal: Normal range of motion.    Lymphadenopathy:     Cervical: No cervical adenopathy.  Skin:    General: Skin is warm and dry.  Neurological:     Mental Status: She is alert and oriented to person, place, and time.     Deep Tendon Reflexes: Reflexes are normal and symmetric.  Psychiatric:        Behavior: Behavior normal.        Thought Content: Thought content normal.        Judgment: Judgment normal.    BP (!) 147/91   Pulse 74   Temp 99.3 F (37.4 C) (Temporal)   Ht 5' 3" (1.6 m)   Wt 160 lb 3.2 oz (72.7 kg)   LMP 09/05/2019   SpO2 100%   BMI 28.38 kg/m         Assessment & Plan:  Christine Christine [name] comes in today with chief complaint of Gynecologic Exam   Diagnosis and orders addressed:  1. Annual physical exam - CMP14+EGFR - Lipid panel - CBC with Differential/Platelet - Thyroid Panel  With TSH  2. Pap smear, low-risk - PAP age 32-65  3. Generalized anxiety disorder Stress management - ALPRAZolam (XANAX) 0.25 MG tablet; TAKE 1 TABLET BY MOUTH TWICE A DAY AS NEEDED (NEEDS TO BE SEEN)  Dispense: 20 tablet; Refill: 1  4. Primary insomnia Take instead of ambien a few nights and see if works better - amitriptyline (ELAVIL) 25 MG tablet; Take 1 tablet (25 mg total) by mouth at bedtime.  Dispense: 30 tablet; Refill: 3   Labs pending Health Maintenance reviewed Diet and exercise encouraged  Follow up plan: prn   [name], FNP

## 2019-10-07 NOTE — Patient Instructions (Signed)

## 2019-10-08 LAB — CMP14+EGFR
ALT: 12 IU/L (ref 0–32)
AST: 19 IU/L (ref 0–40)
Albumin/Globulin Ratio: 1.4 (ref 1.2–2.2)
Albumin: 4.1 g/dL (ref 3.8–4.8)
Alkaline Phosphatase: 83 IU/L (ref 39–117)
BUN/Creatinine Ratio: 10 (ref 9–23)
BUN: 8 mg/dL (ref 6–24)
Bilirubin Total: 0.4 mg/dL (ref 0.0–1.2)
CO2: 22 mmol/L (ref 20–29)
Calcium: 9.5 mg/dL (ref 8.7–10.2)
Chloride: 105 mmol/L (ref 96–106)
Creatinine, Ser: 0.82 mg/dL (ref 0.57–1.00)
GFR calc Af Amer: 100 mL/min/{1.73_m2} (ref >59)
GFR calc non Af Amer: 87 mL/min/{1.73_m2} (ref >59)
Globulin, Total: 2.9 g/dL (ref 1.5–4.5)
Glucose: 76 mg/dL (ref 65–99)
Potassium: 3.6 mmol/L (ref 3.5–5.2)
Sodium: 141 mmol/L (ref 134–144)
Total Protein: 7 g/dL (ref 6.0–8.5)

## 2019-10-08 LAB — THYROID PANEL WITH TSH
Free Thyroxine Index: 1.8 (ref 1.2–4.9)
T3 Uptake Ratio: 17 % — ABNORMAL LOW (ref 24–39)
T4, Total: 10.5 ug/dL (ref 4.5–12.0)
TSH: 0.803 u[IU]/mL (ref 0.450–4.500)

## 2019-10-08 LAB — CBC WITH DIFFERENTIAL/PLATELET
Basophils Absolute: 0 10*3/uL (ref 0.0–0.2)
Basos: 1 %
EOS (ABSOLUTE): 0 10*3/uL (ref 0.0–0.4)
Eos: 0 %
Hematocrit: 39.1 % (ref 34.0–46.6)
Hemoglobin: 13.5 g/dL (ref 11.1–15.9)
Immature Grans (Abs): 0 10*3/uL (ref 0.0–0.1)
Immature Granulocytes: 0 %
Lymphocytes Absolute: 1.8 10*3/uL (ref 0.7–3.1)
Lymphs: 22 %
MCH: 29.9 pg (ref 26.6–33.0)
MCHC: 34.5 g/dL (ref 31.5–35.7)
MCV: 87 fL (ref 79–97)
Monocytes Absolute: 0.3 10*3/uL (ref 0.1–0.9)
Monocytes: 4 %
Neutrophils Absolute: 5.8 10*3/uL (ref 1.4–7.0)
Neutrophils: 73 %
Platelets: 274 10*3/uL (ref 150–450)
RBC: 4.51 x10E6/uL (ref 3.77–5.28)
RDW: 12.9 % (ref 11.7–15.4)
WBC: 7.9 10*3/uL (ref 3.4–10.8)

## 2019-10-08 LAB — LIPID PANEL
Chol/HDL Ratio: 4.8 ratio — ABNORMAL HIGH (ref 0.0–4.4)
Cholesterol, Total: 240 mg/dL — ABNORMAL HIGH (ref 100–199)
HDL: 50 mg/dL (ref >39)
LDL Chol Calc (NIH): 169 mg/dL — ABNORMAL HIGH (ref 0–99)
Triglycerides: 117 mg/dL (ref 0–149)
VLDL Cholesterol Cal: 21 mg/dL (ref 5–40)

## 2019-10-11 ENCOUNTER — Encounter: Payer: Self-pay | Admitting: Nurse Practitioner

## 2019-10-13 LAB — IGP, APTIMA HPV, RFX 16/18,45: HPV Aptima: NEGATIVE

## 2019-10-29 ENCOUNTER — Other Ambulatory Visit: Payer: Self-pay | Admitting: Nurse Practitioner

## 2019-10-29 DIAGNOSIS — F5101 Primary insomnia: Secondary | ICD-10-CM

## 2019-11-07 ENCOUNTER — Other Ambulatory Visit: Payer: Self-pay | Admitting: *Deleted

## 2019-11-07 ENCOUNTER — Telehealth: Payer: Self-pay | Admitting: Family Medicine

## 2019-11-07 DIAGNOSIS — J4530 Mild persistent asthma, uncomplicated: Secondary | ICD-10-CM

## 2019-11-07 MED ORDER — SPIRIVA RESPIMAT 1.25 MCG/ACT IN AERS: 2.0000 | INHALATION_SPRAY | Freq: Every day | RESPIRATORY_TRACT | 0 refills | Status: DC

## 2019-11-07 NOTE — Telephone Encounter (Signed)
Patient is requesting a refill for Spiriva, she is out. Last seen January 2020. She made an appointment for tomorrow, 11/08/19. [facility] [place].

## 2019-11-07 NOTE — Telephone Encounter (Signed)
Prescription has been sent in. Called patient and informed. Patient verbalized understanding.  

## 2019-11-08 ENCOUNTER — Encounter: Payer: Self-pay | Admitting: Allergy & Immunology

## 2019-11-08 ENCOUNTER — Other Ambulatory Visit: Payer: Self-pay

## 2019-11-08 ENCOUNTER — Ambulatory Visit (INDEPENDENT_AMBULATORY_CARE_PROVIDER_SITE_OTHER): Payer: BC Managed Care – PPO | Admitting: Allergy & Immunology

## 2019-11-08 VITALS — BP 160/100 | HR 73 | Temp 98.4°F | Ht 63.0 in

## 2019-11-08 DIAGNOSIS — J4530 Mild persistent asthma, uncomplicated: Secondary | ICD-10-CM | POA: Diagnosis not present

## 2019-11-08 DIAGNOSIS — T7800XD Anaphylactic reaction due to unspecified food, subsequent encounter: Secondary | ICD-10-CM | POA: Diagnosis not present

## 2019-11-08 DIAGNOSIS — K219 Gastro-esophageal reflux disease without esophagitis: Secondary | ICD-10-CM | POA: Diagnosis not present

## 2019-11-08 DIAGNOSIS — J3089 Other allergic rhinitis: Secondary | ICD-10-CM | POA: Diagnosis not present

## 2019-11-08 DIAGNOSIS — J302 Other seasonal allergic rhinitis: Secondary | ICD-10-CM

## 2019-11-08 MED ORDER — XHANCE 93 MCG/ACT NA EXHU: 2.0000 | INHALANT_SUSPENSION | Freq: Two times a day (BID) | NASAL | 5 refills | Status: DC

## 2019-11-08 MED ORDER — FAMOTIDINE 20 MG PO TABS: 20.0000 mg | ORAL_TABLET | Freq: Two times a day (BID) | ORAL | 5 refills | Status: DC

## 2019-11-08 MED ORDER — EPINEPHRINE 0.3 MG/0.3ML IJ SOAJ: 0.3000 mg | Freq: Once | INTRAMUSCULAR | 1 refills | Status: AC

## 2019-11-08 MED ORDER — MONTELUKAST SODIUM 10 MG PO TABS: ORAL_TABLET | ORAL | 1 refills | Status: DC

## 2019-11-08 MED ORDER — SPIRIVA RESPIMAT 1.25 MCG/ACT IN AERS: 2.0000 | INHALATION_SPRAY | Freq: Every day | RESPIRATORY_TRACT | 5 refills | Status: DC

## 2019-11-08 NOTE — Patient Instructions (Addendum)
1. Moderate persistent asthma, uncomplicated - with history of thrush - Lung testing looked great today. - Continue with Spiriva Respimat 1.25 mcg - 2 puffs once a day to prevent cough or wheeze - Continue with montelukast 10mg  daily.  - Daily controller medication(s): Singulair 10mg  daily and Spiriva 1.41mcg two puffs once daily - Prior to physical activity: albuterol 2 puffs 10-15 minutes before physical activity. - Rescue medications: albuterol 4 puffs every 4-6 hours as needed - Asthma control goals:  * Full participation in all desired activities (may need albuterol before activity) * Albuterol use two time or less a week on average (not counting use with activity) * Cough interfering with sleep two time or less a month * Oral steroids no more than once a year * No hospitalizations  2. Reflux - Continue with Protonix 40 mg once a day for reflux. - Add on Pepcid 20mg  twice daily.  - Could consider GI consult.  3. Seasonal and perennial allergic rhinitis - Continue with nasal saline rinses - Stop the Nasonex and start Xhance two sprays per nostril daily. - We will send in the script to the [facility] outpatient pharmacy, and they will call you to confirm your shipping address. - You can review how to use the device here: https://www.xhance.com - Ask to be enrolled in the auto-refill program so you can get a year for free. - Restart the cetirizine 10mg  daily to see if this can help dry out the postnasal drip. - We can change antihistamines if needed.  - Call us in 2-3 weeks with an update.   4. Alpha gal allergy - We will refill the AuviQ. - Continue to avoid red meats.   5. Return in about 3 months (around 02/06/2020). This can be an in-person, a virtual Webex or a telephone follow up visit.   Please inform us of any Emergency Department visits, hospitalizations, or changes in symptoms. Call us before going to the ED for breathing or allergy symptoms since we might be able to fit  you in for a sick visit. Feel free to contact us anytime with any questions, problems, or concerns.  It was a pleasure to see you again today!  Websites that have reliable patient information: 1. American Academy of Asthma, Allergy, and Immunology: www.aaaai.org 2. Food Allergy Research and Education (FARE): foodallergy.org 3. Mothers of Asthmatics: http://www.asthmacommunitynetwork.org 4. American College of Allergy, Asthma, and Immunology: www.acaai.org  "Like" [place] on Facebook and Instagram for our latest updates!      Make sure you are registered to vote! If you have moved or changed any of your contact information, you will need to get this updated before voting!  In some cases, you MAY be able to register to vote online: [email or web address]

## 2019-11-08 NOTE — Progress Notes (Signed)
FOLLOW UP  Date of Service/Encounter:  11/08/19   Assessment:   Mild persistent asthma, uncomplicated  Seasonal and perennial allergic rhinitis (dust mites, ragweed, cats) - with increased phlegm production (starting Xhance today)  Anaphylactic shock due to food (alpha gal sensitivity)  GERD - on PPI (adding on an H2 blocker today)  Plan/Recommendations:   1. Moderate persistent asthma, uncomplicated - with history of thrush - Lung testing looked great today. - Continue with Spiriva Respimat 1.25 mcg - 2 puffs once Christine day to prevent cough or wheeze - Continue with montelukast 10mg  daily.  - Daily controller medication(s): Singulair 10mg  daily and Spiriva 1.55mcg two puffs once daily - Prior to physical activity: albuterol 2 puffs 10-15 minutes before physical activity. - Rescue medications: albuterol 4 puffs every 4-6 hours as needed - Asthma control goals:  * Full participation in all desired activities (may need albuterol before activity) * Albuterol use two time or less Christine week on average (not counting use with activity) * Cough interfering with sleep two time or less Christine month * Oral steroids no more than once Christine year * No hospitalizations  2. Reflux - Continue with Protonix 40 mg once Christine day for reflux. - Add on Pepcid 20mg  twice daily.  - Could consider GI consult.  3. Seasonal and perennial allergic rhinitis - Continue with nasal saline rinses - Stop the Nasonex and start Xhance two sprays per nostril daily. - We will send in the script to the [facility] outpatient pharmacy, and they will call you to confirm your shipping address. - You can review how to use the device here: [email or web address].xhance.com - Ask to be enrolled in the auto-refill program so you can get Christine year for free. - Restart the cetirizine 10mg  daily to see if this can help dry out the postnasal drip. - We can change antihistamines if needed.  - Call us in 2-3 weeks with an update.   4. Alpha gal allergy  - We will refill the AuviQ. - Continue to avoid red meats.   5. Return in about 3 months (around 02/06/2020). This can be an in-person, Christine virtual Webex or Christine telephone follow up visit.  Subjective:   Christine [name] is Christine 43 y.o. [name] presenting today for follow up of  Chief Complaint  Patient presents with  . Asthma  . Sinus Problem    phlem, post nasal two months     Christine [name] has Christine history of the following: Patient Active Problem List   Diagnosis Date Noted  . Gastroesophageal reflux disease 09/13/2018  . TMJ pain dysfunction syndrome 09/25/2017  . Allergic rhinitis 02/18/2016  . White coat syndrome with high blood pressure without hypertension 09/06/2015  . BMI 32.0-32.9,adult 09/06/2015  . Vulvitis 11/24/2014  . Mammographic breast lesion 11/24/2014  . Mild persistent asthma, uncomplicated 96/29/5284  . Generalized anxiety disorder 02/10/2013  . Vitamin D deficiency     History obtained from: chart review and patient.  Christine [name] is Christine 43 y.o. [name] presenting for Christine follow up visit.  She was last seen in January 2020 for sick visit.  At that time, she was started on prednisone.  She was also started on Spiriva 1.25 mcg 2 puffs once daily.  She was continued on Singulair 10 mg daily as well as albuterol.  For her reflux, she was changed from Prilosec to Protonix 40 mg daily.  For her allergic rhinitis, she was continued on Flonase, Astelin, Mucinex, and cetirizine.  She has  Christine history of alpha gal allergy and continued avoidance was recommended.  Since the last visit, she has mostly done well. [place] [employer] [place] [employer]. For the past 2-3 months, she has had alternating problems with anxiety and reflux. She also reports some postnasal drip and pressure. She reports pressure in her sinuses with phlegm production. She thinks that the phlegm is somewhat thick and viscous.   Asthma/Respiratory Symptom History: Breathing is  well controlled. She did use the Spiriva, but is unsure whether it helped. She is using her rescue inhaler very rarely. She does not use it because she has anxiety issues. She has not needed prednisone at all since the last visit. She is not using prednisone at all.   Allergic Rhinitis Symptom History: She was on the Nasonex routinely but it was drying out her nose. She starts to feel her mouth and throat feeling dry. She has been using her nasal saline rinses. She does not think that the Astelin was covered very well. She   Food Allergy Symptom History: She continues to avoid mammalian meat.  She has had no accidental ingestions.  Evidently, she had her levels checked on multiple occasions but they never changed.  She is not interested in retesting today.  She does not miss mammalian meat at all.  She does need Christine refill on her Auvi-Q.  Otherwise, there have been no changes to her past medical history, surgical history, family history, or social history.    Review of Systems  Constitutional: Negative.  Negative for chills, fever, malaise/fatigue and weight loss.  HENT: Positive for sinus pain. Negative for congestion, ear discharge and ear pain.        Positive for postnasal drip and mucous production.  Eyes: Negative for pain, discharge and redness.  Respiratory: Negative for cough, sputum production, shortness of breath and wheezing.   Cardiovascular: Negative.  Negative for chest pain and palpitations.  Gastrointestinal: Negative for abdominal pain, constipation, diarrhea, heartburn, nausea and vomiting.  Skin: Negative.  Negative for itching and rash.  Neurological: Negative for dizziness and headaches.  Endo/Heme/Allergies: Negative for environmental allergies. Does not bruise/bleed easily.       Objective:   Blood pressure (!) 160/100, pulse 73, temperature 98.4 F (36.9 C), temperature source Temporal, height 5\' 3"  (1.6 m), SpO2 98 %. Body mass index is 28.38 kg/m.   Physical  Exam:  Physical Exam  Constitutional: She appears well-developed.  Very talkative.  HENT:  Head: Normocephalic and atraumatic.  Right Ear: Tympanic membrane, external ear and ear canal normal.  Left Ear: Tympanic membrane, external ear and ear canal normal.  Nose: Mucosal edema and rhinorrhea present. No nasal deformity or septal deviation. No epistaxis. Right sinus exhibits no maxillary sinus tenderness and no frontal sinus tenderness. Left sinus exhibits no maxillary sinus tenderness and no frontal sinus tenderness.  Mouth/Throat: Uvula is midline and oropharynx is clear and moist. Mucous membranes are not pale and not dry.  There is some mucous draining down the back of the throat. There is some sinus tenderness over the frontal sinuses.   Eyes: Pupils are equal, round, and reactive to light. Conjunctivae and EOM are normal. Right eye exhibits no chemosis and no discharge. Left eye exhibits no chemosis and no discharge. Right conjunctiva is not injected. Left conjunctiva is not injected.  Cardiovascular: Normal rate, regular rhythm and normal heart sounds.  Respiratory: Effort normal and breath sounds normal. No accessory muscle usage.  No tachypnea. No respiratory distress. She has no wheezes. She has no rhonchi. She has no rales. She exhibits no tenderness.  Moving air well in all lung fields. No increased work of breathing noted.   Lymphadenopathy:    She has no cervical adenopathy.  Neurological: She is alert.  Skin: No abrasion, no petechiae and no rash noted. Rash is not papular, not vesicular and not urticarial. No erythema. No pallor.  No eczematous or urticarial lesions noted.   Psychiatric: She has Christine normal mood and affect.     Diagnostic studies:    Spirometry: results normal (FEV1: 2.60/79%, FVC: 3.30/87%, FEV1/FVC: 79%).    Spirometry consistent with normal pattern.   Allergy Studies: none       Malachi Bonds, MD  [facility]

## 2019-11-15 ENCOUNTER — Other Ambulatory Visit: Payer: Self-pay | Admitting: Family Medicine

## 2019-11-22 ENCOUNTER — Ambulatory Visit: Payer: Self-pay | Admitting: Nurse Practitioner

## 2019-12-26 ENCOUNTER — Telehealth: Payer: Self-pay

## 2019-12-26 NOTE — Telephone Encounter (Signed)
PA for [facility] initiated through [email or web address]

## 2019-12-28 NOTE — Telephone Encounter (Signed)
She has been on fluticasone, Nasonex, and Astelin. Is that not enough to get it covered? She has [employer]. I thought that was easy to get [place] covered.   I will send to [facility] to see what she can make happen.   Malachi Bonds, MD [facility]

## 2019-12-28 NOTE — Telephone Encounter (Signed)
PA for xhance has been denied stating that the patient does not meet criteria. Please advise a different alternative.

## 2019-12-29 NOTE — Telephone Encounter (Signed)
Effective from 12/29/2019 through 12/27/2020.

## 2019-12-29 NOTE — Telephone Encounter (Signed)
Prior authorization resubmitted and approved by insurance. I am sending the approval to the pharmacy and to the scan center for chart entry.

## 2020-01-28 ENCOUNTER — Other Ambulatory Visit: Payer: Self-pay | Admitting: Nurse Practitioner

## 2020-01-28 DIAGNOSIS — F5101 Primary insomnia: Secondary | ICD-10-CM

## 2020-02-07 ENCOUNTER — Ambulatory Visit: Payer: BC Managed Care – PPO | Admitting: Allergy & Immunology

## 2020-02-07 ENCOUNTER — Encounter: Payer: Self-pay | Admitting: Allergy & Immunology

## 2020-02-07 ENCOUNTER — Other Ambulatory Visit: Payer: Self-pay

## 2020-02-07 VITALS — BP 142/86 | HR 78 | Temp 98.4°F | Resp 18 | Ht 64.0 in | Wt 186.8 lb

## 2020-02-07 DIAGNOSIS — J3089 Other allergic rhinitis: Secondary | ICD-10-CM

## 2020-02-07 DIAGNOSIS — K219 Gastro-esophageal reflux disease without esophagitis: Secondary | ICD-10-CM

## 2020-02-07 DIAGNOSIS — T7800XD Anaphylactic reaction due to unspecified food, subsequent encounter: Secondary | ICD-10-CM

## 2020-02-07 DIAGNOSIS — J302 Other seasonal allergic rhinitis: Secondary | ICD-10-CM

## 2020-02-07 DIAGNOSIS — J4530 Mild persistent asthma, uncomplicated: Secondary | ICD-10-CM | POA: Diagnosis not present

## 2020-02-07 NOTE — Progress Notes (Signed)
FOLLOW UP  Date of Service/Encounter:  02/07/20   Assessment:   Mild persistent asthma, uncomplicated  Seasonal and perennial allergic rhinitis(dust mites, ragweed, cats) - markedly improved on Xhance  Anaphylactic shock due to food(alpha gal sensitivity)  GERD - on PPI (adding on an H2 blocker today)   Christine [name] is doing very well today on her Spiriva and Singulair for her asthma.  She has not had any episodes of thrush since getting rid of the inhaled steroid and her regimen.  Her rhinitis is very well controlled with use of the [facility].  She reports that she has never had better symptom control and with the [facility].  Fortunately, she is still receiving it on a free basis through the company.  She is hopeful that this will continue.  She did have an accidental exposure to whey in the form of a candy, which has taught her to read ingredients a lot more closely.    Plan/Recommendations:   1. Moderate persistent asthma, uncomplicated - with history of thrush - Lung testing looked great today. - We are not going to make any changes at this time.  - Daily controller medication(s): Singulair 10mg  daily and Spiriva 1.40mcg two puffs once daily - Prior to physical activity: albuterol 2 puffs 10-15 minutes before physical activity. - Rescue medications: albuterol 4 puffs every 4-6 hours as needed - Asthma control goals:  * Full participation in all desired activities (may need albuterol before activity) * Albuterol use two time or less a week on average (not counting use with activity) * Cough interfering with sleep two time or less a month * Oral steroids no more than once a year * No hospitalizations  2. Reflux - Continue with Protonix 40 mg once a day for reflux. - Continue with Pepcid 20mg  twice daily as needed.   3. Seasonal and perennial allergic rhinitis - Continue with nasal saline rinses - Continue with Xhance two sprays per nostril daily. - Continue the cetirizine  10mg  daily.   4. Alpha gal allergy - 32m is up to date.  - Continue to avoid red meats.   5. Return in about 6 months (around 08/09/2020). This can be an in-person, a virtual Webex or a telephone follow up visit.   Subjective:   Christine [name] is a 44 y.o. female presenting today for follow up of  Chief Complaint  Patient presents with  . Asthma    Christine [name] has a history of the following: Patient Active Problem List   Diagnosis Date Noted  . Gastroesophageal reflux disease 09/13/2018  . TMJ pain dysfunction syndrome 09/25/2017  . Allergic rhinitis 02/18/2016  . White coat syndrome with high blood pressure without hypertension 09/06/2015  . BMI 32.0-32.9,adult 09/06/2015  . Vulvitis 11/24/2014  . Mammographic breast lesion 11/24/2014  . Mild persistent asthma, uncomplicated 02/10/2013  . Generalized anxiety disorder 02/10/2013  . Vitamin D deficiency     History obtained from: chart review and patient.  Christine [name] is a 44 y.o. female presenting for a follow up visit.  She was last seen in December 2020.  At that time, her lung testing looked excellent.  We continued with Spiriva 2 puffs daily as well as Singulair 10 mg daily.  She has a history of thrush, so we have avoided inhaled steroids.  For her reflux, we continued Protonix 40 mg daily and added on Pepcid 20 mg twice daily.  For her allergic rhinitis, we stopped the Nasonex and started [facility] 2  sprays per nostril daily.  She has a history of an alpha gal allergy and we refilled her Auvi-Q.  Asthma/Respiratory Symptom History: Her asthma has been well controlled.  She has not required the use of her rescue inhaler at all.  She has not needed prednisone.  ACT score is 23, indicating excellent asthma control.  She has not been to the hospital or emergency room for her symptoms.  Allergic Rhinitis Symptom History: She has been ahving sinus and allergy stuff for the last week or so. This was normal pollen symptoms. She was  more aggressive with her medication regimen and her symptoms cleared up. She did not require any antibiotics at all.   Food Allergy Symptom History: She had a reaction with itching over her entire body. The only difference was eating Werther's Original, which apparently contains whey protein. This was the only difference. Itching cleared up after eating Werther's original. She stopped the candy and her symptoms improved nearly immediately.   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  He is enjoying it and has been there for about a month now.  Otherwise, there have been no changes to her past medical history, surgical history, family history, or social history.    Review of Systems  Constitutional: Negative.  Negative for chills, fever, malaise/fatigue and weight loss.  HENT: Negative.  Negative for congestion, ear discharge and ear pain.   Eyes: Negative for pain, discharge and redness.  Respiratory: Negative for cough, sputum production, shortness of breath and wheezing.   Cardiovascular: Negative.  Negative for chest pain and palpitations.  Gastrointestinal: Negative for abdominal pain, constipation, diarrhea, heartburn, nausea and vomiting.  Skin: Negative.  Negative for itching and rash.  Neurological: Negative for dizziness and headaches.  Endo/Heme/Allergies: Negative for environmental allergies. Does not bruise/bleed easily.       Objective:   Blood pressure (!) 142/86, pulse 78, temperature 98.4 F (36.9 C), resp. rate 18, height 5\' 4"  (1.626 m), weight 186 lb 12.8 oz (84.7 kg), SpO2 98 %. Body mass index is 32.06 kg/m.   Physical Exam:  Physical Exam  Constitutional: She appears well-developed.  Pleasant female.  Cooperative with the exam.  HENT:  Head: Normocephalic and atraumatic.  Right Ear: Tympanic membrane, external ear and ear canal normal.  Left Ear: Tympanic membrane, external ear and ear canal  normal.  Nose: Mucosal edema and rhinorrhea present. No nasal deformity or septal deviation. No epistaxis. Right sinus exhibits no maxillary sinus tenderness and no frontal sinus tenderness. Left sinus exhibits no maxillary sinus tenderness and no frontal sinus tenderness.  Mouth/Throat: Uvula is midline and oropharynx is clear and moist. Mucous membranes are not pale and not dry.  Turbinates enlarged bilaterally. No discharge noted. t  Eyes: Pupils are equal, round, and reactive to light. Conjunctivae and EOM are normal. Right eye exhibits no chemosis and no discharge. Left eye exhibits no chemosis and no discharge. Right conjunctiva is not injected. Left conjunctiva is not injected.  Allergic shiners bilaterally.  Cardiovascular: Normal rate, regular rhythm and normal heart sounds.  Respiratory: Effort normal and breath sounds normal. No accessory muscle usage. No tachypnea. No respiratory distress. She has no wheezes. She has no rhonchi. She has no rales. She exhibits no tenderness.  Moving air well in all lung fields. No increased work of breathing noted.   Lymphadenopathy:    She has no cervical adenopathy.  Neurological: She is alert.  Skin: No abrasion, no petechiae and no rash noted. Rash is not papular, not vesicular and not urticarial. No erythema. No pallor.  No eczematous or urticarial lesions noted.   Psychiatric: She has a normal mood and affect.     Diagnostic studies:    Spirometry: results abnormal (FEV1: 3.40/93%, FVC: 77%, FEV1/FVC: 73%).    Spirometry consistent with normal pattern.   Allergy Studies: none        Salvatore Marvel, MD  [facility]

## 2020-02-07 NOTE — Patient Instructions (Addendum)
1. Moderate persistent asthma, uncomplicated - with history of thrush - Lung testing looked great today. - We are not going to make any changes at this time.  - Daily controller medication(s): Singulair 10mg  daily and Spiriva 1.50mcg two puffs once daily - Prior to physical activity: albuterol 2 puffs 10-15 minutes before physical activity. - Rescue medications: albuterol 4 puffs every 4-6 hours as needed - Asthma control goals:  * Full participation in all desired activities (may need albuterol before activity) * Albuterol use two time or less a week on average (not counting use with activity) * Cough interfering with sleep two time or less a month * Oral steroids no more than once a year * No hospitalizations  2. Reflux - Continue with Protonix 40 mg once a day for reflux. - Continue with Pepcid 20mg  twice daily as needed.   3. Seasonal and perennial allergic rhinitis - Continue with nasal saline rinses - Continue with Xhance two sprays per nostril daily. - Continue the cetirizine 10mg  daily.   4. Alpha gal allergy - 32m is up to date.  - Continue to avoid red meats.   5. Return in about 6 months (around 08/09/2020). This can be an in-person, a virtual Webex or a telephone follow up visit.   Please inform of any Emergency Department visits, hospitalizations, or changes in symptoms. Call [facility] before going to the ED for breathing or allergy symptoms since we might be able to fit you in for a sick visit. Feel free to contact 08/11/2020 anytime with any questions, problems, or concerns.  It was a pleasure to see you again today!  Websites that have reliable patient information: 1. American Academy of Asthma, Allergy, and Immunology: www.aaaai.org 2. Food Allergy Research and Education (FARE): foodallergy.org 3. Mothers of Asthmatics: http://www.asthmacommunitynetwork.org 4. American College of Allergy, Asthma, and Immunology: www.acaai.org   COVID-19 Vaccine Information can be found  at: [place] For questions related to vaccine distribution or appointments, please email [email or web address] or call [phone].     "Like" [place] on Facebook and Instagram for our latest updates!        Make sure you are registered to vote! If you have moved or changed any of your contact information, you will need to get this updated before voting!  In some cases, you MAY be able to register to vote online: [email or web address]

## 2020-02-08 ENCOUNTER — Other Ambulatory Visit: Payer: Self-pay | Admitting: Nurse Practitioner

## 2020-02-08 DIAGNOSIS — J4530 Mild persistent asthma, uncomplicated: Secondary | ICD-10-CM

## 2020-02-20 ENCOUNTER — Ambulatory Visit: Payer: BC Managed Care – PPO | Admitting: Family Medicine

## 2020-02-20 ENCOUNTER — Encounter: Payer: Self-pay | Admitting: Family Medicine

## 2020-02-20 ENCOUNTER — Other Ambulatory Visit: Payer: Self-pay

## 2020-02-20 VITALS — BP 158/92 | HR 96 | Temp 98.4°F | Ht 64.0 in | Wt 187.4 lb

## 2020-02-20 DIAGNOSIS — R300 Dysuria: Secondary | ICD-10-CM

## 2020-02-20 DIAGNOSIS — N760 Acute vaginitis: Secondary | ICD-10-CM

## 2020-02-20 DIAGNOSIS — B9689 Other specified bacterial agents as the cause of diseases classified elsewhere: Secondary | ICD-10-CM

## 2020-02-20 DIAGNOSIS — N3001 Acute cystitis with hematuria: Secondary | ICD-10-CM | POA: Diagnosis not present

## 2020-02-20 DIAGNOSIS — N898 Other specified noninflammatory disorders of vagina: Secondary | ICD-10-CM

## 2020-02-20 LAB — MICROSCOPIC EXAMINATION: Renal Epithel, UA: NONE SEEN /hpf

## 2020-02-20 LAB — WET PREP FOR TRICH, YEAST, CLUE
Clue Cell Exam: POSITIVE — AB
Trichomonas Exam: NEGATIVE
Yeast Exam: NEGATIVE

## 2020-02-20 LAB — URINALYSIS, COMPLETE
Bilirubin, UA: NEGATIVE
Glucose, UA: NEGATIVE
Ketones, UA: NEGATIVE
Nitrite, UA: POSITIVE — AB
Specific Gravity, UA: >1.03 — ABNORMAL HIGH (ref 1.005–1.030)
Urobilinogen, Ur: 0.2 mg/dL (ref 0.2–1.0)
pH, UA: 6 (ref 5.0–7.5)

## 2020-02-20 MED ORDER — FLUCONAZOLE 150 MG PO TABS: 150.0000 mg | ORAL_TABLET | Freq: Once | ORAL | 0 refills | Status: AC

## 2020-02-20 MED ORDER — METRONIDAZOLE 500 MG PO TABS: 500.0000 mg | ORAL_TABLET | Freq: Two times a day (BID) | ORAL | 0 refills | Status: DC

## 2020-02-20 MED ORDER — CIPROFLOXACIN HCL 500 MG PO TABS: 500.0000 mg | ORAL_TABLET | Freq: Two times a day (BID) | ORAL | 0 refills | Status: AC

## 2020-02-20 NOTE — Patient Instructions (Addendum)
I am treating you for bacterial vaginosis.  This is NOT a sexually transmitted disease.  Bacterial vaginosis is an overgrowth of normal vaginal bacteria.  I have prescribed Metronidazole 500mg  for you to take 2 times a day for the next week.  DO NOT drink alcohol while taking this medication or you will become extremely ill.  Healthy vaginal hygiene practices   -  Avoid sleeper pajamas. Nightgowns allow air to circulate.  Sleep without underpants whenever possible.  -  Wear cotton underpants during the day. Double-rinse underwear after washing to avoid residual irritants. Do not use fabric softeners for underwear and swimsuits.  - Avoid tights, leotards, leggings, "skinny" jeans, and other tight-fitting clothing. Skirts and loose-fitting pants allow air to circulate.  - Eat yogurt or use a probiotic that contains Lactobacillus.  This will help maintain healthy vaginal balance.  - Avoid pantyliners.  Instead use tampons or cotton pads.  - Daily warm bathing is helpful:     - Soak in clean water (no soap) for 10 to 15 minutes. Adding vinegar or baking soda to the water has not been specifically studied and may not be better than clean water alone.      - Use soap to wash regions other than the genital area just before getting out of the tub. Limit use of any soap on genital areas. Use fragance-free soaps.     - Rinse the genital area well and gently pat dry.  Don't rub.  Hair dryer to assist with drying can be used only if on cool setting.     - Do not use bubble baths or perfumed soaps.  - Do not use any feminine sprays, douches or powders.  These contain chemicals that will irritate the skin.  - If the genital area is tender or swollen, cool compresses may relieve the discomfort. Unscented wet wipes can be used instead of toilet paper for wiping.   - Emollients, such as Vaseline, may help protect skin and can be applied to the irritated area.  - Always remember to wipe front-to-back after  bowel movements. Pat dry after urination.  - Do not sit in wet swimsuits for long periods of time after swimming    Urinary Tract Infection, Adult A urinary tract infection (UTI) is an infection of any part of the urinary tract. The urinary tract includes:  The kidneys.  The ureters.  The bladder.  The urethra. These organs make, store, and get rid of pee (urine) in the body. What are the causes? This is caused by germs (bacteria) in your genital area. These germs grow and cause swelling (inflammation) of your urinary tract. What increases the risk? You are more likely to develop this condition if:  You have a small, thin tube (catheter) to drain pee.  You cannot control when you pee or poop (incontinence).  You are female, and: ? You use these methods to prevent pregnancy:  A medicine that kills sperm (spermicide).  A device that blocks sperm (diaphragm). ? You have low levels of a female hormone (estrogen). ? You are pregnant.  You have genes that add to your risk.  You are sexually active.  You take antibiotic medicines.  You have trouble peeing because of: ? A prostate that is bigger than normal, if you are female. ? A blockage in the part of your body that drains pee from the bladder (urethra). ? A kidney stone. ? A nerve condition that affects your bladder (neurogenic bladder). ? Not getting  enough to drink. ? Not peeing often enough.  You have other conditions, such as: ? Diabetes. ? A weak disease-fighting system (immune system). ? Sickle cell disease. ? Gout. ? Injury of the spine. What are the signs or symptoms? Symptoms of this condition include:  Needing to pee right away (urgently).  Peeing often.  Peeing small amounts often.  Pain or burning when peeing.  Blood in the pee.  Pee that smells bad or not like normal.  Trouble peeing.  Pee that is cloudy.  Fluid coming from the vagina, if you are female.  Pain in the belly or lower  back. Other symptoms include:  Throwing up (vomiting).  No urge to eat.  Feeling mixed up (confused).  Being tired and grouchy (irritable).  A fever.  Watery poop (diarrhea). How is this treated? This condition may be treated with:  Antibiotic medicine.  Other medicines.  Drinking enough water. Follow these instructions at home:  Medicines  Take over-the-counter and prescription medicines only as told by your doctor.  If you were prescribed an antibiotic medicine, take it as told by your doctor. Do not stop taking it even if you start to feel better. General instructions  Make sure you: ? Pee until your bladder is empty. ? Do not hold pee for a long time. ? Empty your bladder after sex. ? Wipe from front to back after pooping if you are a female. Use each tissue one time when you wipe.  Drink enough fluid to keep your pee pale yellow.  Keep all follow-up visits as told by your doctor. This is important. Contact a doctor if:  You do not get better after 1-2 days.  Your symptoms go away and then come back. Get help right away if:  You have very bad back pain.  You have very bad pain in your lower belly.  You have a fever.  You are sick to your stomach (nauseous).  You are throwing up. Summary  A urinary tract infection (UTI) is an infection of any part of the urinary tract.  This condition is caused by germs in your genital area.  There are many risk factors for a UTI. These include having a small, thin tube to drain pee and not being able to control when you pee or poop.  Treatment includes antibiotic medicines for germs.  Drink enough fluid to keep your pee pale yellow. This information is not intended to replace advice given to you by your health care provider. Make sure you discuss any questions you have with your health care provider. Document Revised: 10/28/2018 Document Reviewed: 05/20/2018 Elsevier Patient Education  2020 [employer].

## 2020-02-20 NOTE — Progress Notes (Signed)
Subjective:  Patient ID: Christine [name], female    DOB: 06/02/1976, 44 y.o.   MRN: [id number]  Patient Care Team: Chevis Pretty, FNP as PCP - General (Nurse Practitioner)   Chief Complaint:  Dysuria (x 3 days) and Vaginal Itching (x 3 days)   HPI: Christine [name] is a 44 y.o. female presenting on 02/20/2020 for Dysuria (x 3 days) and Vaginal Itching (x 3 days)   Dysuria  This is a new problem. The current episode started in the past 7 days. The problem occurs every urination. The problem has been gradually worsening. The quality of the pain is described as burning. The pain is at a severity of 4/10. The pain is mild. There has been no fever. She is sexually active. There is no history of pyelonephritis. Associated symptoms include a discharge, frequency and urgency. Pertinent negatives include no chills, flank pain, hematuria, hesitancy, nausea, possible pregnancy, sweats or vomiting. She has tried increased fluids and acetaminophen for the symptoms. The treatment provided no relief.  Vaginal Itching The patient's primary symptoms include genital itching and vaginal discharge. The patient's pertinent negatives include no genital lesions, genital odor, genital rash, missed menses, pelvic pain or vaginal bleeding. This is a new problem. The current episode started in the past 7 days. The problem occurs constantly. The problem has been gradually improving. The pain is mild. The problem affects both sides. She is not pregnant. Associated symptoms include discolored urine, dysuria, frequency and urgency. Pertinent negatives include no abdominal pain, anorexia, back pain, chills, constipation, diarrhea, fever, flank pain, headaches, hematuria, joint pain, joint swelling, nausea, painful intercourse, rash, sore throat or vomiting. The vaginal discharge was white, thick and scant. There has been no bleeding. The symptoms are aggravated by activity, intercourse, tactile pressure and urinating.  Treatments tried: Monistat. The treatment provided moderate relief. She is sexually active. No, her partner does not have an STD.        Relevant past medical, surgical, family, and social history reviewed and updated as indicated.  Allergies and medications reviewed and updated. Date reviewed: Chart in Epic.   Past Medical History:  Diagnosis Date  . Asthma   . GAD (generalized anxiety disorder)   . Vitamin D deficiency     Past Surgical History:  Procedure Laterality Date  . BREAST BIOPSY Right 07/2015   fibrocystic changes  . CESAREAN SECTION    . TONSILLECTOMY AND ADENOIDECTOMY      Social History   Socioeconomic History  . Marital status: Married    Spouse name: Not on file  . Number of children: Not on file  . Years of education: Not on file  . Highest education level: Not on file  Occupational History  . Not on file  Tobacco Use  . Smoking status: Never Smoker  . Smokeless tobacco: Never Used  Substance and Sexual Activity  . Alcohol use: Yes  . Drug use: No  . Sexual activity: Not on file  Other Topics Concern  . Not on file  Social History Narrative  . Not on file   Social Determinants of Health   Financial Resource Strain:   . Difficulty of Paying Living Expenses:   Food Insecurity:   . Worried About Charity fundraiser in the Last Year:   . Arboriculturist in the Last Year:   Transportation Needs:   . Film/video editor (Medical):   Marland Kitchen Lack of Transportation (Non-Medical):   Physical Activity:   .  Days of Exercise per Week:   . Minutes of Exercise per Session:   Stress:   . Feeling of Stress :   Social Connections:   . Frequency of Communication with Friends and Family:   . Frequency of Social Gatherings with Friends and Family:   . Attends Religious Services:   . Active Member of Clubs or Organizations:   . Attends Archivist Meetings:   Marland Kitchen Marital Status:   Intimate Partner Violence:   . Fear of Current or Ex-Partner:   .  Emotionally Abused:   Marland Kitchen Physically Abused:   . Sexually Abused:     Outpatient Encounter Medications as of 02/20/2020  Medication Sig  . albuterol (PROVENTIL) (2.5 MG/3ML) 0.083% nebulizer solution Take 3 mLs (2.5 mg total) by nebulization every 4 (four) hours as needed for wheezing or shortness of breath.  . ALPRAZolam (XANAX) 0.25 MG tablet TAKE 1 TABLET BY MOUTH TWICE A DAY AS NEEDED (NEEDS TO BE SEEN)  . amitriptyline (ELAVIL) 25 MG tablet TAKE 1 TABLET BY MOUTH EVERYDAY AT BEDTIME  . Cholecalciferol (VITAMIN D3) 1000 units CAPS Take by mouth.  . Ethynodiol Diac-Eth Estradiol (KELNOR 1/35 PO) Kelnor 1/35 (28) 1 mg-35 mcg tablet  TAKE 1 TABLET BY MOUTH EVERY DAY  . famotidine (PEPCID) 20 MG tablet Take 1 tablet (20 mg total) by mouth 2 (two) times daily.  . fluticasone (FLOVENT HFA) 110 MCG/ACT inhaler Inhale 2 puffs into the lungs 2 (two) times daily.  . Fluticasone Propionate (XHANCE) 93 MCG/ACT EXHU Place 2 sprays into the nose 2 (two) times daily.  Marland Kitchen [facility] 1/35 1-35 MG-MCG tablet TAKE 1 TABLET BY MOUTH EVERY DAY  . montelukast (SINGULAIR) 10 MG tablet TAKE 1 TABLET BY MOUTH EVERYDAY AT BEDTIME  . pantoprazole (PROTONIX) 40 MG tablet Take 1 tablet (40 mg total) by mouth daily.  . sertraline (ZOLOFT) 100 MG tablet Take 1 tablet (100 mg total) by mouth daily. (Needs to be seen before next refill)  . Tiotropium Bromide Monohydrate (SPIRIVA RESPIMAT) 1.25 MCG/ACT AERS Inhale 2 puffs into the lungs daily.  Marland Kitchen UNABLE TO FIND   . VENTOLIN HFA 108 (90 Base) MCG/ACT inhaler TAKE 2 PUFFS BY MOUTH EVERY 6 HOURS AS NEEDED FOR WHEEZE OR SHORTNESS OF BREATH  . zolpidem (AMBIEN) 10 MG tablet TAKE 1 TABLET BY MOUTH EVERYDAY AT BEDTIME  . ciprofloxacin (CIPRO) 500 MG tablet Take 1 tablet (500 mg total) by mouth 2 (two) times daily for 5 days.  . fluconazole (DIFLUCAN) 150 MG tablet Take 1 tablet (150 mg total) by mouth once for 1 dose.  . metroNIDAZOLE (FLAGYL) 500 MG tablet Take 1 tablet (500 mg  total) by mouth 2 (two) times daily.   No facility-administered encounter medications on file as of 02/20/2020.    Allergies  Allergen Reactions  . Sulfa Antibiotics Hives  . Sulfasalazine Hives  . Doxycycline Hyclate   . Ivp Dye [Iodinated Diagnostic Agents] Hives  . Other     ALPHA GAL - MEAT ALLERGY   . Paxil [Paroxetine Hcl] Other (See Comments)    Made pt very angry   . Phosphorus Diarrhea    Review of Systems  Constitutional: Negative for activity change, appetite change, chills, diaphoresis, fatigue, fever and unexpected weight change.  HENT: Negative.  Negative for sore throat.   Eyes: Negative.  Negative for photophobia and visual disturbance.  Respiratory: Negative for cough, chest tightness and shortness of breath.   Cardiovascular: Negative for chest pain, palpitations and leg swelling.  Gastrointestinal: Negative for abdominal pain, anorexia, blood in stool, constipation, diarrhea, nausea and vomiting.  Endocrine: Negative.   Genitourinary: Positive for dysuria, frequency, urgency, vaginal discharge and vaginal pain. Negative for decreased urine volume, difficulty urinating, dyspareunia, enuresis, flank pain, genital sores, hematuria, hesitancy, menstrual problem, missed menses, pelvic pain and vaginal bleeding.  Musculoskeletal: Negative for arthralgias, back pain, joint pain and myalgias.  Skin: Negative.  Negative for rash.  Allergic/Immunologic: Negative.   Neurological: Negative for dizziness, weakness and headaches.  Hematological: Negative.   Psychiatric/Behavioral: Negative for confusion, hallucinations, sleep disturbance and suicidal ideas.  All other systems reviewed and are negative.       Objective:  BP (!) 158/92   Pulse 96   Temp 98.4 F (36.9 C)   Ht 5' 4"  (1.626 m)   Wt 187 lb 6.4 oz (85 kg)   LMP 02/13/2020   SpO2 98%   BMI 32.17 kg/m    Wt Readings from Last 3 Encounters:  02/20/20 187 lb 6.4 oz (85 kg)  02/07/20 186 lb 12.8 oz (84.7  kg)  10/07/19 160 lb 3.2 oz (72.7 kg)    Physical Exam Vitals and nursing note reviewed.  Constitutional:      General: She is not in acute distress.    Appearance: Normal appearance. She is well-developed and well-groomed. She is not ill-appearing, toxic-appearing or diaphoretic.  HENT:     Head: Normocephalic and atraumatic.     Jaw: There is normal jaw occlusion.     Right Ear: Hearing normal.     Left Ear: Hearing normal.     Nose: Nose normal.     Mouth/Throat:     Lips: Pink.     Mouth: Mucous membranes are moist.     Pharynx: Oropharynx is clear. Uvula midline.  Eyes:     General: Lids are normal.     Extraocular Movements: Extraocular movements intact.     Conjunctiva/sclera: Conjunctivae normal.     Pupils: Pupils are equal, round, and reactive to light.  Neck:     Thyroid: No thyroid mass, thyromegaly or thyroid tenderness.     Vascular: No carotid bruit or JVD.     Trachea: Trachea and phonation normal.  Cardiovascular:     Rate and Rhythm: Normal rate and regular rhythm.     Chest Wall: PMI is not displaced.     Pulses: Normal pulses.     Heart sounds: Normal heart sounds. No murmur. No friction rub. No gallop.   Pulmonary:     Effort: Pulmonary effort is normal. No respiratory distress.     Breath sounds: Normal breath sounds. No wheezing.  Abdominal:     General: Bowel sounds are normal. There is no distension or abdominal bruit.     Palpations: Abdomen is soft. There is no hepatomegaly or splenomegaly.     Tenderness: There is no abdominal tenderness. There is no right CVA tenderness or left CVA tenderness.     Hernia: No hernia is present.  Genitourinary:    Comments: Deferred, self wet prep Musculoskeletal:        General: Normal range of motion.     Cervical back: Normal range of motion and neck supple.     Right lower leg: No edema.     Left lower leg: No edema.  Lymphadenopathy:     Cervical: No cervical adenopathy.  Skin:    General: Skin is  warm and dry.     Capillary Refill: Capillary refill takes less than  2 seconds.     Coloration: Skin is not cyanotic, jaundiced or pale.     Findings: No rash.  Neurological:     General: No focal deficit present.     Mental Status: She is alert and oriented to person, place, and time.     Cranial Nerves: Cranial nerves are intact.     Sensory: Sensation is intact.     Motor: Motor function is intact.     Coordination: Coordination is intact.     Gait: Gait is intact.     Deep Tendon Reflexes: Reflexes are normal and symmetric.  Psychiatric:        Attention and Perception: Attention and perception normal.        Mood and Affect: Affect normal. Mood is anxious.        Speech: Speech normal.        Behavior: Behavior normal. Behavior is cooperative.        Thought Content: Thought content normal.        Cognition and Memory: Cognition and memory normal.        Judgment: Judgment normal.     Results for orders placed or performed in visit on 10/07/19  CMP14+EGFR  Result Value Ref Range   Glucose 76 65 - 99 mg/dL   BUN 8 6 - 24 mg/dL   Creatinine, Ser 0.82 0.57 - 1.00 mg/dL   GFR calc non Af Amer 87 >59 mL/min/1.73   GFR calc Af Amer 100 >59 mL/min/1.73   BUN/Creatinine Ratio 10 9 - 23   Sodium 141 134 - 144 mmol/L   Potassium 3.6 3.5 - 5.2 mmol/L   Chloride 105 96 - 106 mmol/L   CO2 22 20 - 29 mmol/L   Calcium 9.5 8.7 - 10.2 mg/dL   Total Protein 7.0 6.0 - 8.5 g/dL   Albumin 4.1 3.8 - 4.8 g/dL   Globulin, Total 2.9 1.5 - 4.5 g/dL   Albumin/Globulin Ratio 1.4 1.2 - 2.2   Bilirubin Total 0.4 0.0 - 1.2 mg/dL   Alkaline Phosphatase 83 39 - 117 IU/L   AST 19 0 - 40 IU/L   ALT 12 0 - 32 IU/L  Lipid panel  Result Value Ref Range   Cholesterol, Total 240 (H) 100 - 199 mg/dL   Triglycerides 117 0 - 149 mg/dL   HDL 50 >39 mg/dL   VLDL Cholesterol Cal 21 5 - 40 mg/dL   LDL Chol Calc (NIH) 169 (H) 0 - 99 mg/dL   Chol/HDL Ratio 4.8 (H) 0.0 - 4.4 ratio  CBC with  Differential/Platelet  Result Value Ref Range   WBC 7.9 3.4 - 10.8 x10E3/uL   RBC 4.51 3.77 - 5.28 x10E6/uL   Hemoglobin 13.5 11.1 - 15.9 g/dL   Hematocrit 39.1 34.0 - 46.6 %   MCV 87 79 - 97 fL   MCH 29.9 26.6 - 33.0 pg   MCHC 34.5 31.5 - 35.7 g/dL   RDW 12.9 11.7 - 15.4 %   Platelets 274 150 - 450 x10E3/uL   Neutrophils 73 Not Estab. %   Lymphs 22 Not Estab. %   Monocytes 4 Not Estab. %   Eos 0 Not Estab. %   Basos 1 Not Estab. %   Neutrophils Absolute 5.8 1.4 - 7.0 x10E3/uL   Lymphocytes Absolute 1.8 0.7 - 3.1 x10E3/uL   Monocytes Absolute 0.3 0.1 - 0.9 x10E3/uL   EOS (ABSOLUTE) 0.0 0.0 - 0.4 x10E3/uL   Basophils Absolute 0.0 0.0 - 0.2 x10E3/uL  Immature Granulocytes 0 Not Estab. %   Immature Grans (Abs) 0.0 0.0 - 0.1 x10E3/uL  Thyroid Panel With TSH  Result Value Ref Range   TSH 0.803 0.450 - 4.500 uIU/mL   T4, Total 10.5 4.5 - 12.0 ug/dL   T3 Uptake Ratio 17 (L) 24 - 39 %   Free Thyroxine Index 1.8 1.2 - 4.9  PAP age 50-65  Result Value Ref Range   Interpretation NILM    Category NIL    Infection FOC    Adequacy ENDO    Clinician Provided ICD10 Comment    Performed by: Comment    Note: Comment    Test Methodology Comment    HPV Aptima Negative Negative     urinalysis: positive nitrites, many bacteria, 1+ leukocytes, 1+ blood, trace protein.   Wet prep: positive clue cells, negative yeast and trich  Pertinent labs & imaging results that were available during my care of the patient were reviewed by me and considered in my medical decision making.  Assessment & Plan:  Olivya was seen today for dysuria and vaginal itching.  Diagnoses and all orders for this visit:  Dysuria Urinalysis in office indicative of acute cystitis. Will culture.  -     Urine Culture -     Urinalysis, Complete -     WET PREP FOR TRICH, YEAST, CLUE  Vaginal irritation Wet prep reveals Clue Cells. No yeast or trich.  -     Urine Culture -     Urinalysis, Complete -     WET PREP FOR  TRICH, YEAST, CLUE -     fluconazole (DIFLUCAN) 150 MG tablet; Take 1 tablet (150 mg total) by mouth once for 1 dose.  Acute cystitis with hematuria Urinalysis with many bacteria 1+ leukocytes, positive nitrites, 1+ blood. Will treat with below. Culture pending. Will change treatment if warranted.  -     ciprofloxacin (CIPRO) 500 MG tablet; Take 1 tablet (500 mg total) by mouth 2 (two) times daily for 5 days.  Bacterial vaginosis Symptomatic care discussed in detail. Vaginal hygiene discussed in detail. No alcohol while on below. Complete full course of medications.  -     metroNIDAZOLE (FLAGYL) 500 MG tablet; Take 1 tablet (500 mg total) by mouth 2 (two) times daily.     Continue all other maintenance medications.  Follow up plan: Return if symptoms worsen or fail to improve.  Continue healthy lifestyle choices, including diet (rich in fruits, vegetables, and lean proteins, and low in salt and simple carbohydrates) and exercise (at least 30 minutes of moderate physical activity daily).  Educational handout given for BV, cystitis  The above assessment and management plan was discussed with the patient. The patient verbalized understanding of and has agreed to the management plan. Patient is aware to call the clinic if they develop any new symptoms or if symptoms persist or worsen. Patient is aware when to return to the clinic for a follow-up visit. Patient educated on when it is appropriate to go to the emergency department.   Monia Pouch, FNP-C [facility] [phone]

## 2020-02-22 LAB — URINE CULTURE

## 2020-03-06 ENCOUNTER — Other Ambulatory Visit: Payer: BC Managed Care – PPO

## 2020-03-06 ENCOUNTER — Other Ambulatory Visit: Payer: Self-pay

## 2020-03-06 DIAGNOSIS — R300 Dysuria: Secondary | ICD-10-CM

## 2020-03-06 DIAGNOSIS — N898 Other specified noninflammatory disorders of vagina: Secondary | ICD-10-CM

## 2020-03-06 LAB — URINALYSIS, COMPLETE
Bilirubin, UA: NEGATIVE
Glucose, UA: NEGATIVE
Ketones, UA: NEGATIVE
Leukocytes,UA: NEGATIVE
Nitrite, UA: NEGATIVE
Protein,UA: NEGATIVE
Specific Gravity, UA: 1.02 (ref 1.005–1.030)
Urobilinogen, Ur: 0.2 mg/dL (ref 0.2–1.0)
pH, UA: 6.5 (ref 5.0–7.5)

## 2020-03-06 LAB — MICROSCOPIC EXAMINATION: Renal Epithel, UA: NONE SEEN /hpf

## 2020-03-06 LAB — WET PREP FOR TRICH, YEAST, CLUE
Clue Cell Exam: NEGATIVE
Trichomonas Exam: NEGATIVE
Yeast Exam: POSITIVE — AB

## 2020-03-07 ENCOUNTER — Telehealth: Payer: Self-pay | Admitting: Nurse Practitioner

## 2020-03-07 ENCOUNTER — Other Ambulatory Visit: Payer: Self-pay | Admitting: Family Medicine

## 2020-03-07 MED ORDER — FLUCONAZOLE 150 MG PO TABS: 150.0000 mg | ORAL_TABLET | Freq: Once | ORAL | 0 refills | Status: AC

## 2020-03-07 NOTE — Telephone Encounter (Signed)
Please contact the patient : Lab neg for infection

## 2020-03-07 NOTE — Telephone Encounter (Signed)
Patient asking for diflucan for the yeast on her wet prep she seen on my chart. States she was on 2 rounds of antibiotics recently she is asking for 2 diflucan's to be called in. Please advise.

## 2020-03-07 NOTE — Telephone Encounter (Signed)
I sent in the requested prescription 

## 2020-03-07 NOTE — Telephone Encounter (Signed)
Provider off please review

## 2020-03-07 NOTE — Telephone Encounter (Signed)
Patient aware and verbalized understanding. °

## 2020-03-08 ENCOUNTER — Other Ambulatory Visit: Payer: Self-pay | Admitting: Nurse Practitioner

## 2020-03-08 DIAGNOSIS — K219 Gastro-esophageal reflux disease without esophagitis: Secondary | ICD-10-CM

## 2020-03-08 MED ORDER — FLUCONAZOLE 150 MG PO TABS: 150.0000 mg | ORAL_TABLET | Freq: Once | ORAL | 0 refills | Status: AC

## 2020-03-08 NOTE — Telephone Encounter (Signed)
Pt aware [facility]

## 2020-03-13 ENCOUNTER — Other Ambulatory Visit: Payer: Self-pay | Admitting: Nurse Practitioner

## 2020-03-13 MED ORDER — FLUCONAZOLE 150 MG PO TABS: ORAL_TABLET | ORAL | 0 refills | Status: DC

## 2020-03-14 ENCOUNTER — Ambulatory Visit: Payer: BC Managed Care – PPO | Admitting: Family Medicine

## 2020-03-14 DIAGNOSIS — R350 Frequency of micturition: Secondary | ICD-10-CM | POA: Diagnosis not present

## 2020-03-14 DIAGNOSIS — F419 Anxiety disorder, unspecified: Secondary | ICD-10-CM | POA: Diagnosis not present

## 2020-03-14 DIAGNOSIS — N760 Acute vaginitis: Secondary | ICD-10-CM | POA: Diagnosis not present

## 2020-03-29 ENCOUNTER — Other Ambulatory Visit: Payer: Self-pay | Admitting: Nurse Practitioner

## 2020-03-29 DIAGNOSIS — F411 Generalized anxiety disorder: Secondary | ICD-10-CM

## 2020-03-30 ENCOUNTER — Other Ambulatory Visit: Payer: Self-pay | Admitting: Nurse Practitioner

## 2020-03-30 ENCOUNTER — Telehealth: Payer: Self-pay | Admitting: Nurse Practitioner

## 2020-03-30 DIAGNOSIS — K219 Gastro-esophageal reflux disease without esophagitis: Secondary | ICD-10-CM

## 2020-03-30 MED ORDER — FAMOTIDINE 20 MG PO TABS: 20.0000 mg | ORAL_TABLET | Freq: Two times a day (BID) | ORAL | 5 refills | Status: DC

## 2020-03-30 NOTE — Telephone Encounter (Signed)
  Prescription Request  03/30/2020  What is the name of the medication or equipment? Acid Reflux meds  Have you contacted your pharmacy to request a refill? (if applicable) Yes  Which pharmacy would you like this sent to? [facility]   Patient notified that their request is being sent to the clinical staff for review and that they should receive a response within 2 business days.   MMM's pt.  Please call pt.  She said she was just seen & needs her meds.

## 2020-03-30 NOTE — Telephone Encounter (Signed)
Refill sent to pharmacy.   

## 2020-03-31 ENCOUNTER — Other Ambulatory Visit: Payer: Self-pay | Admitting: Nurse Practitioner

## 2020-03-31 DIAGNOSIS — F5101 Primary insomnia: Secondary | ICD-10-CM

## 2020-04-03 ENCOUNTER — Telehealth: Payer: Self-pay | Admitting: Nurse Practitioner

## 2020-04-03 DIAGNOSIS — K219 Gastro-esophageal reflux disease without esophagitis: Secondary | ICD-10-CM

## 2020-04-03 MED ORDER — PANTOPRAZOLE SODIUM 40 MG PO TBEC: 40.0000 mg | DELAYED_RELEASE_TABLET | Freq: Every day | ORAL | 0 refills | Status: DC

## 2020-04-03 NOTE — Telephone Encounter (Signed)
  Prescription Request  04/03/2020  What is the name of the medication or equipment? pantoprazole (PROTONIX) 40 MG tablet  Have you contacted your pharmacy to request a refill? (if applicable) no  [facility]    Patient notified that their request is being sent to the clinical staff for review and that they should receive a response within 2 business days.

## 2020-04-03 NOTE — Telephone Encounter (Signed)
Refill sent to pharmacy patient aware

## 2020-04-05 ENCOUNTER — Other Ambulatory Visit: Payer: Self-pay | Admitting: Nurse Practitioner

## 2020-04-05 DIAGNOSIS — F411 Generalized anxiety disorder: Secondary | ICD-10-CM

## 2020-04-05 NOTE — Telephone Encounter (Signed)
Pt aware 30 days sent in on 03/29/20 she will call back to make her 6 mos appt

## 2020-04-08 ENCOUNTER — Other Ambulatory Visit: Payer: Self-pay | Admitting: Allergy & Immunology

## 2020-04-22 ENCOUNTER — Other Ambulatory Visit: Payer: Self-pay | Admitting: Nurse Practitioner

## 2020-04-22 DIAGNOSIS — F411 Generalized anxiety disorder: Secondary | ICD-10-CM

## 2020-04-26 ENCOUNTER — Other Ambulatory Visit: Payer: Self-pay | Admitting: Nurse Practitioner

## 2020-04-26 DIAGNOSIS — F5101 Primary insomnia: Secondary | ICD-10-CM

## 2020-04-26 NOTE — Telephone Encounter (Signed)
Appt made

## 2020-04-26 NOTE — Telephone Encounter (Signed)
MMM NTBS 30 days given 04/02/20

## 2020-04-27 ENCOUNTER — Encounter: Payer: Self-pay | Admitting: Nurse Practitioner

## 2020-04-27 ENCOUNTER — Ambulatory Visit (INDEPENDENT_AMBULATORY_CARE_PROVIDER_SITE_OTHER): Payer: BC Managed Care – PPO | Admitting: Nurse Practitioner

## 2020-04-27 DIAGNOSIS — Z91199 Patient's noncompliance with other medical treatment and regimen due to unspecified reason: Secondary | ICD-10-CM

## 2020-04-27 DIAGNOSIS — Z5329 Procedure and treatment not carried out because of patient's decision for other reasons: Secondary | ICD-10-CM

## 2020-04-27 DIAGNOSIS — G4700 Insomnia, unspecified: Secondary | ICD-10-CM | POA: Insufficient documentation

## 2020-04-27 NOTE — Progress Notes (Deleted)
.  Virtual Visit via telephone Note Due to COVID-19 pandemic this visit was conducted virtually. This visit type was conducted due to national recommendations for restrictions regarding the COVID-19 Pandemic (e.g. social distancing, sheltering in place) in an effort to limit this patient's exposure and mitigate transmission in our community. All issues noted in this document were discussed and addressed.  A physical exam was not performed with this format.  I connected with Christine [name] on 04/27/20 at *** by telephone and verified that I am speaking with the correct person using two identifiers. Christine [name] is currently located at *** and *** is currently with *** during visit. The provider, [name], FNP is located in their office at time of visit.  I discussed the limitations, risks, security and privacy concerns of performing an evaluation and management service by telephone and the availability of in person appointments. I also discussed with the patient that there may be a patient responsible charge related to this service. The patient expressed understanding and agreed to proceed.   History and Present Illness:   Chief Complaint: Medical Management of Chronic Issues    HPI:  1. Gastroesophageal reflux disease, unspecified whether esophagitis present I son protonix daily. Works well to keep symptom under control  2. Generalized anxiety disorder ***  3. Vitamin D deficiency Is on daily vitamin d upplement  4. BMI 32.0-32.9,adult No recent weight chnages Wt Readings from Last 3 Encounters:  02/20/20 187 lb 6.4 oz (85 kg)  02/07/20 186 lb 12.8 oz (84.7 kg)  10/07/19 160 lb 3.2 oz (72.7 kg)   BMI Readings from Last 3 Encounters:  02/20/20 32.17 kg/m  02/07/20 32.06 kg/m  11/08/19 28.38 kg/m     5. Primary insomnia Is on ambien and elavil to sleep at night. Sleeps about    Outpatient Encounter Medications as of 04/27/2020  Medication Sig  .  albuterol (PROVENTIL) (2.5 MG/3ML) 0.083% nebulizer solution Take 3 mLs (2.5 mg total) by nebulization every 4 (four) hours as needed for wheezing or shortness of breath.  . ALPRAZolam (XANAX) 0.25 MG tablet TAKE 1 TABLET BY MOUTH TWICE A DAY AS NEEDED (NEEDS TO BE SEEN)  . amitriptyline (ELAVIL) 25 MG tablet Take 1 tablet (25 mg total) by mouth at bedtime. (Needs to be seen before next refill)  . Cholecalciferol (VITAMIN D3) 1000 units CAPS Take by mouth.  . Ethynodiol Diac-Eth Estradiol (KELNOR 1/35 PO) Kelnor 1/35 (28) 1 mg-35 mcg tablet  TAKE 1 TABLET BY MOUTH EVERY DAY  . fluconazole (DIFLUCAN) 150 MG tablet 1 po q week x 4 weeks  . fluticasone (FLOVENT HFA) 110 MCG/ACT inhaler Inhale 2 puffs into the lungs 2 (two) times daily.  . Fluticasone Propionate (XHANCE) 93 MCG/ACT EXHU Place 2 sprays into the nose 2 (two) times daily.  Marland Kitchen [facility] 1/35 1-35 MG-MCG tablet TAKE 1 TABLET BY MOUTH EVERY DAY  . metroNIDAZOLE (FLAGYL) 500 MG tablet Take 1 tablet (500 mg total) by mouth 2 (two) times daily.  . montelukast (SINGULAIR) 10 MG tablet TAKE 1 TABLET BY MOUTH EVERYDAY AT BEDTIME  . pantoprazole (PROTONIX) 40 MG tablet Take 1 tablet (40 mg total) by mouth daily. (Needs to be seen before next refill)  . sertraline (ZOLOFT) 100 MG tablet TAKE 1 TABLET (100 MG TOTAL) BY MOUTH DAILY. (NEEDS TO BE SEEN BEFORE NEXT REFILL)  . Tiotropium Bromide Monohydrate (SPIRIVA RESPIMAT) 1.25 MCG/ACT AERS Inhale 2 puffs into the lungs daily.  Marland Kitchen UNABLE TO FIND   .  VENTOLIN HFA 108 (90 Base) MCG/ACT inhaler TAKE 2 PUFFS BY MOUTH EVERY 6 HOURS AS NEEDED FOR WHEEZE OR SHORTNESS OF BREATH  . zolpidem (AMBIEN) 10 MG tablet TAKE 1 TABLET BY MOUTH EVERYDAY AT BEDTIME   No facility-administered encounter medications on file as of 04/27/2020.    Past Surgical History:  Procedure Laterality Date  . BREAST BIOPSY Right 07/2015   fibrocystic changes  . CESAREAN SECTION    . TONSILLECTOMY AND ADENOIDECTOMY      Family  History  Problem Relation Age of Onset  . Diabetes Mother   . Hypertension Mother   . Hyperlipidemia Father   . Hypertension Father   . Allergic rhinitis Neg Hx   . Angioedema Neg Hx   . Asthma Neg Hx   . Eczema Neg Hx   . Immunodeficiency Neg Hx   . Urticaria Neg Hx     New complaints: ***  Social history: ***  Controlled substance contract: ***    ROS   Observations/Objective: ***  Assessment and Plan: ***  Follow Up Instructions: ***    I discussed the assessment and treatment plan with the patient. The patient was provided an opportunity to ask questions and all were answered. The patient agreed with the plan and demonstrated an understanding of the instructions.   The patient was advised to call back or seek an in-person evaluation if the symptoms worsen or if the condition fails to improve as anticipated.  The above assessment and management plan was discussed with the patient. The patient verbalized understanding of and has agreed to the management plan. Patient is aware to call the clinic if symptoms persist or worsen. Patient is aware when to return to the clinic for a follow-up visit. Patient educated on when it is appropriate to go to the emergency department.   Time call ended:    I provided *** minutes of non-face-to-face time during this encounter.    Mary-Margaret Daphine Deutscher, FNP

## 2020-04-30 ENCOUNTER — Other Ambulatory Visit: Payer: Self-pay | Admitting: Nurse Practitioner

## 2020-04-30 DIAGNOSIS — K219 Gastro-esophageal reflux disease without esophagitis: Secondary | ICD-10-CM

## 2020-04-30 NOTE — Telephone Encounter (Signed)
MMM NTBS 30 days given 04/08/20

## 2020-04-30 NOTE — Telephone Encounter (Signed)
APPOINTMENT SCHEDULED

## 2020-05-01 ENCOUNTER — Ambulatory Visit (INDEPENDENT_AMBULATORY_CARE_PROVIDER_SITE_OTHER): Payer: BC Managed Care – PPO | Admitting: Nurse Practitioner

## 2020-05-01 ENCOUNTER — Encounter: Payer: Self-pay | Admitting: Nurse Practitioner

## 2020-05-01 DIAGNOSIS — F5101 Primary insomnia: Secondary | ICD-10-CM

## 2020-05-01 DIAGNOSIS — Z6832 Body mass index (BMI) 32.0-32.9, adult: Secondary | ICD-10-CM

## 2020-05-01 DIAGNOSIS — K219 Gastro-esophageal reflux disease without esophagitis: Secondary | ICD-10-CM

## 2020-05-01 DIAGNOSIS — F411 Generalized anxiety disorder: Secondary | ICD-10-CM

## 2020-05-01 DIAGNOSIS — M26629 Arthralgia of temporomandibular joint, unspecified side: Secondary | ICD-10-CM

## 2020-05-01 DIAGNOSIS — E559 Vitamin D deficiency, unspecified: Secondary | ICD-10-CM

## 2020-05-01 MED ORDER — AMITRIPTYLINE HCL 25 MG PO TABS: 25.0000 mg | ORAL_TABLET | Freq: Every day | ORAL | 1 refills | Status: DC

## 2020-05-01 MED ORDER — ALPRAZOLAM 0.25 MG PO TABS: ORAL_TABLET | ORAL | 1 refills | Status: DC

## 2020-05-01 MED ORDER — SERTRALINE HCL 100 MG PO TABS: 100.0000 mg | ORAL_TABLET | Freq: Every day | ORAL | 1 refills | Status: DC

## 2020-05-01 MED ORDER — PANTOPRAZOLE SODIUM 40 MG PO TBEC: 40.0000 mg | DELAYED_RELEASE_TABLET | Freq: Every day | ORAL | 1 refills | Status: DC

## 2020-05-01 NOTE — Progress Notes (Signed)
Virtual Visit via telephone Note Due to COVID-19 pandemic this visit was conducted virtually. This visit type was conducted due to national recommendations for restrictions regarding the COVID-19 Pandemic (e.g. social distancing, sheltering in place) in an effort to limit this patient's exposure and mitigate transmission in our community. All issues noted in this document were discussed and addressed.  A physical exam was not performed with this format.  I connected with Christine [name] on 05/01/20 at 1040 by telephone and verified that I am speaking with the correct person using two identifiers. Christine [name] is currently located at home and no one is currently with  her during visit. The provider, Mary-Margaret Daphine Deutscher, FNP is located in their office at time of visit.  I discussed the limitations, risks, security and privacy concerns of performing an evaluation and management service by telephone and the availability of in person appointments. I also discussed with the patient that there may be a patient responsible charge related to this service. The patient expressed understanding and agreed to proceed.   History and Present Illness:   Chief Complaint: medical management of chronic issues      HPI:  1. Gastroesophageal reflux disease, unspecified whether esophagitis present is on Protonix daily and is doing well.  2. TMJ pain dysfunction syndrome Currently not bothering her  3. Generalized anxiety disorder Has rx for xanax but only takes 1x a month GAD 7 : Generalized Anxiety Score 05/01/2020 08/12/2019 02/17/2017 09/18/2016  Nervous, Anxious, on Edge 0 2 1 0  Control/stop worrying 0 1 0 1  Worry too much - different things 0 1 1 0  Trouble relaxing 0 2 1 1   Restless 0 1 0 0  Easily annoyed or irritable 0 1 1 1   Afraid - awful might happen 0 0 0 0  Total GAD 7 Score 0 8 4 3   Anxiety Difficulty Not difficult at all Somewhat difficult Not difficult at all Not difficult at all      4. Primary insomnia Is on elavil and work well to help her sleep. sleeps 7-8 hour per night  5. Vitamin D deficiency Takes a daily vitamin d supplement  6. BMI 32.0-32.9,adult No recent weight changes Wt Readings from Last 3 Encounters:  02/20/20 187 lb 6.4 oz (85 kg)  02/07/20 186 lb 12.8 oz (84.7 kg)  10/07/19 160 lb 3.2 oz (72.7 kg)   BMI Readings from Last 3 Encounters:  02/20/20 32.17 kg/m  02/07/20 32.06 kg/m  11/08/19 28.38 kg/m       Outpatient Encounter Medications as of 05/01/2020  Medication Sig  . albuterol (PROVENTIL) (2.5 MG/3ML) 0.083% nebulizer solution Take 3 mLs (2.5 mg total) by nebulization every 4 (four) hours as needed for wheezing or shortness of breath.  . ALPRAZolam (XANAX) 0.25 MG tablet TAKE 1 TABLET BY MOUTH TWICE A DAY AS NEEDED (NEEDS TO BE SEEN)  . amitriptyline (ELAVIL) 25 MG tablet Take 1 tablet (25 mg total) by mouth at bedtime. (Needs to be seen before next refill)  . Cholecalciferol (VITAMIN D3) 1000 units CAPS Take by mouth.  . Ethynodiol Diac-Eth Estradiol (KELNOR 1/35 PO) Kelnor 1/35 (28) 1 mg-35 mcg tablet  TAKE 1 TABLET BY MOUTH EVERY DAY  . fluconazole (DIFLUCAN) 150 MG tablet 1 po q week x 4 weeks  . fluticasone (FLOVENT HFA) 110 MCG/ACT inhaler Inhale 2 puffs into the lungs 2 (two) times daily.  . Fluticasone Propionate (XHANCE) 93 MCG/ACT EXHU Place 2 sprays into the nose 2 (  two) times daily.  Marland Kitchen [facility] 1/35 1-35 MG-MCG tablet TAKE 1 TABLET BY MOUTH EVERY DAY  . metroNIDAZOLE (FLAGYL) 500 MG tablet Take 1 tablet (500 mg total) by mouth 2 (two) times daily.  . montelukast (SINGULAIR) 10 MG tablet TAKE 1 TABLET BY MOUTH EVERYDAY AT BEDTIME  . pantoprazole (PROTONIX) 40 MG tablet Take 1 tablet (40 mg total) by mouth daily. (Needs to be seen before next refill)  . sertraline (ZOLOFT) 100 MG tablet TAKE 1 TABLET (100 MG TOTAL) BY MOUTH DAILY. (NEEDS TO BE SEEN BEFORE NEXT REFILL)  . Tiotropium Bromide Monohydrate (SPIRIVA RESPIMAT)  1.25 MCG/ACT AERS Inhale 2 puffs into the lungs daily.  Marland Kitchen UNABLE TO FIND   . VENTOLIN HFA 108 (90 Base) MCG/ACT inhaler TAKE 2 PUFFS BY MOUTH EVERY 6 HOURS AS NEEDED FOR WHEEZE OR SHORTNESS OF BREATH  . zolpidem (AMBIEN) 10 MG tablet TAKE 1 TABLET BY MOUTH EVERYDAY AT BEDTIME   No facility-administered encounter medications on file as of 05/01/2020.    Past Surgical History:  Procedure Laterality Date  . BREAST BIOPSY Right 07/2015   fibrocystic changes  . CESAREAN SECTION    . TONSILLECTOMY AND ADENOIDECTOMY      Family History  Problem Relation Age of Onset  . Diabetes Mother   . Hypertension Mother   . Hyperlipidemia Father   . Hypertension Father   . Allergic rhinitis Neg Hx   . Angioedema Neg Hx   . Asthma Neg Hx   . Eczema Neg Hx   . Immunodeficiency Neg Hx   . Urticaria Neg Hx     New complaints: None today   Review of Systems  Constitutional: Negative for diaphoresis and weight loss.  Eyes: Negative for blurred vision, double vision and pain.  Respiratory: Negative for shortness of breath.   Cardiovascular: Negative for chest pain, palpitations, orthopnea and leg swelling.  Gastrointestinal: Negative for abdominal pain.  Skin: Negative for rash.  Neurological: Negative for dizziness, sensory change, loss of consciousness, weakness and headaches.  Endo/Heme/Allergies: Negative for polydipsia. Does not bruise/bleed easily.  Psychiatric/Behavioral: Negative for memory loss. The patient does not have insomnia.   All other systems reviewed and are negative.    Observations/Objective: Alert and oriented- answers all questions appropriately No distress    Assessment and Plan: Christine [name] comes in today with chief complaint of No chief complaint on file.   Diagnosis and orders addressed:  1. Gastroesophageal reflux disease Avoid spicy foods Do not eat 2 hours prior to bedtime - pantoprazole (PROTONIX) 40 MG tablet; Take 1 tablet (40 mg total) by mouth  daily. (Needs to be seen before next refill)  Dispense: 90 tablet; Refill: 1  2. TMJ pain dysfunction syndrome Motrin as needed  3. Generalized anxiety disorder stress management - sertraline (ZOLOFT) 100 MG tablet; Take 1 tablet (100 mg total) by mouth daily. (Needs to be seen before next refill)  Dispense: 90 tablet; Refill: 1 - ALPRAZolam (XANAX) 0.25 MG tablet; TAKE 1 TABLET BY MOUTH TWICE A DAY AS NEEDED (NEEDS TO BE SEEN)  Dispense: 20 tablet; Refill: 1  4. Primary insomnia Bedtime routine encouraged - amitriptyline (ELAVIL) 25 MG tablet; Take 1 tablet (25 mg total) by mouth at bedtime. (Needs to be seen before next refill)  Dispense: 90 tablet; Refill: 1  5. Vitamin D deficiency Continue daily vitamin d supplement  6. BMI 32.0-32.9,adult Discussed diet and exercise for person with BMI >25 Will recheck weight in 3-6 months    Labs  pending Health Maintenance reviewed Diet and exercise encouraged  Follow up plan: 6 months     I discussed the assessment and treatment plan with the patient. The patient was provided an opportunity to ask questions and all were answered. The patient agreed with the plan and demonstrated an understanding of the instructions.   The patient was advised to call back or seek an in-person evaluation if the symptoms worsen or if the condition fails to improve as anticipated.  The above assessment and management plan was discussed with the patient. The patient verbalized understanding of and has agreed to the management plan. Patient is aware to call the clinic if symptoms persist or worsen. Patient is aware when to return to the clinic for a follow-up visit. Patient educated on when it is appropriate to go to the emergency department.   Time call ended:  10:55  I provided 15 minutes of non-face-to-face time during this encounter.    [name], FNP

## 2020-08-09 ENCOUNTER — Other Ambulatory Visit: Payer: Self-pay | Admitting: Allergy & Immunology

## 2020-08-14 ENCOUNTER — Ambulatory Visit: Payer: BC Managed Care – PPO | Admitting: Allergy and Immunology

## 2020-08-14 ENCOUNTER — Encounter: Payer: Self-pay | Admitting: Allergy and Immunology

## 2020-08-14 ENCOUNTER — Ambulatory Visit (INDEPENDENT_AMBULATORY_CARE_PROVIDER_SITE_OTHER): Payer: BC Managed Care – PPO | Admitting: Allergy and Immunology

## 2020-08-14 ENCOUNTER — Other Ambulatory Visit: Payer: Self-pay

## 2020-08-14 VITALS — BP 168/98 | HR 83 | Temp 98.1°F | Resp 18

## 2020-08-14 DIAGNOSIS — J3089 Other allergic rhinitis: Secondary | ICD-10-CM

## 2020-08-14 DIAGNOSIS — K219 Gastro-esophageal reflux disease without esophagitis: Secondary | ICD-10-CM

## 2020-08-14 DIAGNOSIS — J4530 Mild persistent asthma, uncomplicated: Secondary | ICD-10-CM

## 2020-08-14 DIAGNOSIS — J302 Other seasonal allergic rhinitis: Secondary | ICD-10-CM

## 2020-08-14 DIAGNOSIS — T7800XD Anaphylactic reaction due to unspecified food, subsequent encounter: Secondary | ICD-10-CM

## 2020-08-14 NOTE — Progress Notes (Signed)
[place] - [place] - [place] - [place] - [place]   Follow-up Note  Referring Provider: Bennie Pierini, * Primary Provider: Bennie Pierini, FNP Date of Office Visit: 08/14/2020  Subjective:   Christine [name] (DOB: 03/11/1976) is a 44 y.o. female [facility] in re-evaluation of the following:  HPI: [name] presents to this clinic in evaluation of asthma and allergic rhinitis and reflux and alpha gal syndrome.  Her last visit to this clinic was 07 February 2020.  Overall her asthma has been under acceptable control while utilizing a combination of Spiriva and Singulair.  Rarely does use a short acting bronchodilator.  Likewise, she has had very little problems with her nose while using XHANCE.  She has not required a systemic steroid or antibiotic for any type of airway issue.  Her reflux is under very good control while using Protonix.  Occasionally she will add in famotidine.  She remains away from consumption of all mammal meat.  She has an injectable epinephrine device.  She is very skeptical about receiving the Covid vaccine.  Allergies as of 08/14/2020      Reactions   Sulfa Antibiotics Hives   Sulfasalazine Hives   Doxycycline Hyclate    Ivp Dye [iodinated Diagnostic Agents] Hives   Other    ALPHA GAL - MEAT ALLERGY    Paxil [paroxetine Hcl] Other (See Comments)   Made pt very angry   Phosphorus Diarrhea      Medication List      albuterol (2.5 MG/3ML) 0.083% nebulizer solution Commonly known as: PROVENTIL Take 3 mLs (2.5 mg total) by nebulization every 4 (four) hours as needed for wheezing or shortness of breath.   Ventolin HFA 108 (90 Base) MCG/ACT inhaler Generic drug: albuterol TAKE 2 PUFFS BY MOUTH EVERY 6 HOURS AS NEEDED FOR WHEEZE OR SHORTNESS OF BREATH   ALPRAZolam 0.25 MG tablet Commonly known as: XANAX TAKE 1 TABLET BY MOUTH TWICE A DAY AS NEEDED (NEEDS TO BE SEEN)   amitriptyline 25 MG  tablet Commonly known as: ELAVIL Take 1 tablet (25 mg total) by mouth at bedtime. (Needs to be seen before next refill)   fluconazole 150 MG tablet Commonly known as: Diflucan 1 po q week x 4 weeks   fluticasone 110 MCG/ACT inhaler Commonly known as: FLOVENT HFA Inhale 2 puffs into the lungs 2 (two) times daily.   KELNOR 1/35 PO Kelnor 1/35 (28) 1 mg-35 mcg tablet  TAKE 1 TABLET BY MOUTH EVERY DAY   Kelnor 1/35 1-35 MG-MCG tablet Generic drug: ethynodiol-ethinyl estradiol TAKE 1 TABLET BY MOUTH EVERY DAY   montelukast 10 MG tablet Commonly known as: SINGULAIR TAKE 1 TABLET BY MOUTH EVERYDAY AT BEDTIME   pantoprazole 40 MG tablet Commonly known as: PROTONIX Take 1 tablet (40 mg total) by mouth daily. (Needs to be seen before next refill)   sertraline 100 MG tablet Commonly known as: ZOLOFT Take 1 tablet (100 mg total) by mouth daily. (Needs to be seen before next refill)   Spiriva Respimat 1.25 MCG/ACT Aers Generic drug: Tiotropium Bromide Monohydrate INHALE 2 PUFFS BY MOUTH INTO THE LUNGS DAILY   UNABLE TO FIND   Vitamin D3 25 MCG (1000 UT) Caps Take by mouth.   Xhance 93 MCG/ACT Exhu Generic drug: Fluticasone Propionate Place 2 sprays into the nose 2 (two) times daily.       Past Medical History:  Diagnosis Date   Asthma    GAD (generalized  anxiety disorder)    Vitamin D deficiency     Past Surgical History:  Procedure Laterality Date   BREAST BIOPSY Right 07/2015   fibrocystic changes   CESAREAN SECTION     TONSILLECTOMY AND ADENOIDECTOMY      Review of systems negative except as noted in HPI / PMHx or noted below:  Review of Systems  Constitutional: Negative.   HENT: Negative.   Eyes: Negative.   Respiratory: Negative.   Cardiovascular: Negative.   Gastrointestinal: Negative.   Genitourinary: Negative.   Musculoskeletal: Negative.   Skin: Negative.   Neurological: Negative.   Endo/Heme/Allergies: Negative.   Psychiatric/Behavioral:  Negative.      Objective:   Vitals:   08/14/20 1621  BP: (!) 168/98  Pulse: 83  Resp: 18  Temp: 98.1 F (36.7 C)  SpO2: 98%          Physical Exam Constitutional:      Appearance: She is not diaphoretic.  HENT:     Head: Normocephalic.     Right Ear: Tympanic membrane, ear canal and external ear normal.     Left Ear: Tympanic membrane, ear canal and external ear normal.     Nose: Nose normal. No mucosal edema or rhinorrhea.     Mouth/Throat:     Pharynx: Uvula midline. No oropharyngeal exudate.  Eyes:     Conjunctiva/sclera: Conjunctivae normal.  Neck:     Thyroid: No thyromegaly.     Trachea: Trachea normal. No tracheal tenderness or tracheal deviation.  Cardiovascular:     Rate and Rhythm: Normal rate and regular rhythm.     Heart sounds: Normal heart sounds, S1 normal and S2 normal. No murmur heard.   Pulmonary:     Effort: No respiratory distress.     Breath sounds: Normal breath sounds. No stridor. No wheezing or rales.  Lymphadenopathy:     Head:     Right side of head: No tonsillar adenopathy.     Left side of head: No tonsillar adenopathy.     Cervical: No cervical adenopathy.  Skin:    Findings: No erythema or rash.     Nails: There is no clubbing.  Neurological:     Mental Status: She is alert.     Diagnostics:    Spirometry was performed and demonstrated an FEV1 of 2.52 at 87 % of predicted.   Assessment and Plan:   1. Asthma, well controlled, mild persistent   2. Seasonal and perennial allergic rhinitis   3. Anaphylactic shock due to food, subsequent encounter   4. Gastroesophageal reflux disease, unspecified whether esophagitis present     1.  Continue Singulair 10 mg tablet 1 time per day  2.  Continue Spiriva 1.25 Respimat 2 inhalations 1 time per day  3.  Continue XHANCE 2 sprays each nostril 1-7 times per week depending on disease activity  4.  Continue Protonix 40 mg 1 time per day  5.  If needed:   A.  Cetirizine 10 mg -1  tablet 1 time per day  B.  Famotidine 20 mg -1 tablet 1-2 times per day  C.  Auvi-Q 0.3  6.  Obtain fall flu vaccine and consider Covid vaccine  7.  Return to clinic in 1 year or earlier if problem  Mckynzi appears to be doing quite well on her current plan which includes anti-inflammatory medications in the form of Singulair and XHANCE along with Spiriva and therapy directed against reflux.  She will remain on these medications  and of course avoid consumption of mammal meat and we will see her back in his clinic in 1 year or earlier if there is a problem.  I did have a discussion with her today about receiving the Covid vaccine and I am not really sure that I was very successful in convincing her to obtain that vaccination.  Laurette Schimke, MD Allergy / Immunology [facility]

## 2020-08-14 NOTE — Patient Instructions (Addendum)
  1.  Continue Singulair 10 mg tablet 1 time per day  2.  Continue Spiriva 1.25 Respimat 2 inhalations 1 time per day  3.  Continue XHANCE 2 sprays each nostril 1-7 times per week depending on disease activity  4.  Continue Protonix 40 mg 1 time per day  5.  If needed:   A.  Cetirizine 10 mg -1 tablet 1 time per day  B.  Famotidine 20 mg -1 tablet 1-2 times per day  C.  Auvi-Q 0.3  6.  Obtain fall flu vaccine and consider Covid vaccine  7.  Return to clinic in 1 year or earlier if problem

## 2020-08-15 ENCOUNTER — Encounter: Payer: Self-pay | Admitting: Allergy and Immunology

## 2020-08-30 ENCOUNTER — Telehealth: Payer: Self-pay

## 2020-08-30 NOTE — Telephone Encounter (Signed)
appt made for 09/13/20, pt aware

## 2020-08-30 NOTE — Telephone Encounter (Signed)
Patient states that Amitriptyline is causing weight gain and she would like to switch back to Ambien if possible. Patient has been on Amitriptyline since 10/07/2019.  Patient asking if there is any type of appetite suppressant that she can take to help.  Please review and advise.

## 2020-08-30 NOTE — Telephone Encounter (Signed)
Has to be seen for controlled meds 

## 2020-09-13 ENCOUNTER — Encounter: Payer: Self-pay | Admitting: Nurse Practitioner

## 2020-09-13 ENCOUNTER — Other Ambulatory Visit: Payer: Self-pay

## 2020-09-13 ENCOUNTER — Ambulatory Visit: Payer: BC Managed Care – PPO | Admitting: Nurse Practitioner

## 2020-09-13 VITALS — BP 162/99 | HR 80 | Temp 97.7°F | Resp 20 | Ht 64.0 in | Wt 197.0 lb

## 2020-09-13 DIAGNOSIS — T50905A Adverse effect of unspecified drugs, medicaments and biological substances, initial encounter: Secondary | ICD-10-CM

## 2020-09-13 DIAGNOSIS — F5101 Primary insomnia: Secondary | ICD-10-CM

## 2020-09-13 DIAGNOSIS — R635 Abnormal weight gain: Secondary | ICD-10-CM | POA: Insufficient documentation

## 2020-09-13 DIAGNOSIS — Z23 Encounter for immunization: Secondary | ICD-10-CM | POA: Diagnosis not present

## 2020-09-13 MED ORDER — PHENTERMINE HCL 37.5 MG PO TABS: 37.5000 mg | ORAL_TABLET | Freq: Every day | ORAL | 2 refills | Status: DC

## 2020-09-13 MED ORDER — DOXEPIN HCL 10 MG PO CAPS: 10.0000 mg | ORAL_CAPSULE | Freq: Every day | ORAL | 3 refills | Status: DC

## 2020-09-13 NOTE — Progress Notes (Addendum)
Subjective:    Patient ID: Christine [name], female    DOB: Apr 29, 1976, 44 y.o.   MRN: [id number]  Chief Complaint: Medical Management of Chronic Issues   HPI Pt here today to wanting to get off of amitriptyline, stating she believes it is causing her to gain weight. Lost 30 previously which she has now gained back without change to diet or lifestyle. On Elavil for almost 1 year. States sleep is satisfactory, she did not wake up at all when first starting medication but now she wakes up but does not have trouble falling back asleep. Pt would like appetite suppressant. Takes zoloft as prescribed, takes xanax 1-2 a month.   Wt Readings from Last 3 Encounters:  09/13/20 197 lb (89.4 kg)  02/20/20 187 lb 6.4 oz (85 kg)  02/07/20 186 lb 12.8 oz (84.7 kg)   BMI Readings from Last 3 Encounters:  09/13/20 33.81 kg/m  02/20/20 32.17 kg/m  02/07/20 32.06 kg/m     Review of Systems  Constitutional: Positive for appetite change and unexpected weight change.  HENT: Negative.   Eyes: Negative.   Respiratory: Negative.   Cardiovascular: Negative.   Gastrointestinal: Negative.   Endocrine: Negative.   Genitourinary: Negative.   Musculoskeletal: Negative.   Skin: Negative.   Allergic/Immunologic: Negative.   Neurological: Negative.   Hematological: Negative.   Psychiatric/Behavioral: Positive for sleep disturbance.  All other systems reviewed and are negative.      Objective:   Physical Exam Vitals and nursing note reviewed.  Constitutional:      Appearance: Normal appearance.  HENT:     Head: Normocephalic and atraumatic.     Right Ear: External ear normal.     Left Ear: External ear normal.     Nose: Nose normal.     Mouth/Throat:     Pharynx: Oropharynx is clear.  Eyes:     Pupils: Pupils are equal, round, and reactive to light.  Cardiovascular:     Rate and Rhythm: Normal rate and regular rhythm.  Pulmonary:     Effort: Pulmonary effort is normal.     Breath sounds:  Normal breath sounds.  Abdominal:     General: Abdomen is flat.     Palpations: Abdomen is soft.  Musculoskeletal:        General: Normal range of motion.     Cervical back: Normal range of motion.  Skin:    General: Skin is warm and dry.     Capillary Refill: Capillary refill takes less than 2 seconds.  Neurological:     General: No focal deficit present.     Mental Status: She is alert and oriented to person, place, and time. Mental status is at baseline.  Psychiatric:        Mood and Affect: Mood normal.        Behavior: Behavior normal.        Thought Content: Thought content normal.        Judgment: Judgment normal.    BP (!) 162/99   Pulse 80   Temp 97.7 F (36.5 C) (Temporal)   Resp 20   Ht 5\' 4"  (1.626 m)   Wt 197 lb (89.4 kg)   SpO2 98%   BMI 33.81 kg/m       Assessment & Plan:  in today with chief complaint of Medical Management of Chronic Issues   1. Weight gain due to medication Exercise regularly and eat a well-balanced diet. Practice good sleep hygiene,  go to bed and wake up at the same time each day. Avoid caffeine and alcohol before bed.   -adipex 37.5mg  daily #30 2 refills  2 insomnia Stop taking amitriptyline.  Meds ordered this encounter  Medications  . doxepin (SINEQUAN) 10 MG capsule    Sig: Take 1 capsule (10 mg total) by mouth at bedtime.    Dispense:  30 capsule    Refill:  3        The above assessment and management plan was discussed with the patient. The patient verbalized understanding of and has agreed to the management plan. Patient is aware to call the clinic if symptoms persist or worsen. Patient is aware when to return to the clinic for a follow-up visit. Patient educated on when it is appropriate to go to the emergency department.   Mary-Margaret Daphine Deutscher, FNP

## 2020-09-13 NOTE — Patient Instructions (Signed)

## 2020-09-13 NOTE — Addendum Note (Signed)
Addended by: Bennie Pierini on: 09/13/2020 09:18 AM   Modules accepted: Orders

## 2020-10-04 ENCOUNTER — Telehealth: Payer: Self-pay

## 2020-10-04 MED ORDER — ZOLPIDEM TARTRATE 10 MG PO TABS: 10.0000 mg | ORAL_TABLET | Freq: Every evening | ORAL | 1 refills | Status: DC | PRN

## 2020-10-04 NOTE — Telephone Encounter (Signed)
Stop doxepn and put back on [place]

## 2020-10-08 ENCOUNTER — Telehealth: Payer: Self-pay

## 2020-10-08 NOTE — Telephone Encounter (Signed)
  Prescription Request  10/08/2020  What is the name of the medication or equipment? Ambien RX  Have you contacted your pharmacy to request a refill? (if applicable) yes  Which pharmacy would you like this sent to? [facility]   Patient notified that their request is being sent to the clinical staff for review and that they should receive a response within 2 business days.   MMM's pt.  She was on Ambien & changed to ? Meds, but she doesn't want that anymore. She wants to be put back on Ambien.

## 2020-10-08 NOTE — Telephone Encounter (Signed)
LMOVM medication sent to pharmacy on 10/04/20

## 2020-10-15 ENCOUNTER — Other Ambulatory Visit: Payer: Self-pay | Admitting: Nurse Practitioner

## 2020-10-15 DIAGNOSIS — J4530 Mild persistent asthma, uncomplicated: Secondary | ICD-10-CM

## 2020-10-23 ENCOUNTER — Other Ambulatory Visit: Payer: Self-pay | Admitting: Nurse Practitioner

## 2020-10-23 DIAGNOSIS — F5101 Primary insomnia: Secondary | ICD-10-CM

## 2020-10-23 DIAGNOSIS — F411 Generalized anxiety disorder: Secondary | ICD-10-CM

## 2020-10-28 DIAGNOSIS — Z20822 Contact with and (suspected) exposure to covid-19: Secondary | ICD-10-CM | POA: Diagnosis not present

## 2020-11-01 ENCOUNTER — Other Ambulatory Visit: Payer: Self-pay | Admitting: Allergy & Immunology

## 2020-11-02 DIAGNOSIS — Z20822 Contact with and (suspected) exposure to covid-19: Secondary | ICD-10-CM | POA: Diagnosis not present

## 2020-11-05 ENCOUNTER — Other Ambulatory Visit: Payer: Self-pay | Admitting: Nurse Practitioner

## 2020-11-05 DIAGNOSIS — Z6831 Body mass index (BMI) 31.0-31.9, adult: Secondary | ICD-10-CM | POA: Diagnosis not present

## 2020-11-05 DIAGNOSIS — N3001 Acute cystitis with hematuria: Secondary | ICD-10-CM | POA: Diagnosis not present

## 2020-11-05 DIAGNOSIS — J4530 Mild persistent asthma, uncomplicated: Secondary | ICD-10-CM

## 2020-11-05 DIAGNOSIS — R300 Dysuria: Secondary | ICD-10-CM | POA: Diagnosis not present

## 2020-11-05 NOTE — Telephone Encounter (Signed)
Attempted to contact patient - NA °

## 2020-11-05 NOTE — Telephone Encounter (Signed)
MMM NTBS 30 days given 10/15/20 Last PE 10/07/19

## 2020-11-12 ENCOUNTER — Ambulatory Visit: Payer: BC Managed Care – PPO

## 2020-11-12 ENCOUNTER — Ambulatory Visit: Payer: BC Managed Care – PPO | Admitting: Family Medicine

## 2020-11-12 ENCOUNTER — Encounter: Payer: Self-pay | Admitting: Family Medicine

## 2020-11-12 ENCOUNTER — Other Ambulatory Visit: Payer: Self-pay | Admitting: Family Medicine

## 2020-11-12 ENCOUNTER — Other Ambulatory Visit: Payer: Self-pay

## 2020-11-12 VITALS — BP 143/84 | HR 73 | Temp 98.3°F | Ht 64.0 in | Wt 186.4 lb

## 2020-11-12 DIAGNOSIS — Z20822 Contact with and (suspected) exposure to covid-19: Secondary | ICD-10-CM | POA: Diagnosis not present

## 2020-11-12 DIAGNOSIS — N898 Other specified noninflammatory disorders of vagina: Secondary | ICD-10-CM

## 2020-11-12 DIAGNOSIS — R300 Dysuria: Secondary | ICD-10-CM

## 2020-11-12 LAB — URINALYSIS, COMPLETE
Bilirubin, UA: NEGATIVE
Glucose, UA: NEGATIVE
Ketones, UA: NEGATIVE
Leukocytes,UA: NEGATIVE
Nitrite, UA: NEGATIVE
Protein,UA: NEGATIVE
RBC, UA: NEGATIVE
Specific Gravity, UA: 1.02 (ref 1.005–1.030)
Urobilinogen, Ur: 0.2 mg/dL (ref 0.2–1.0)
pH, UA: 8 — ABNORMAL HIGH (ref 5.0–7.5)

## 2020-11-12 LAB — WET PREP FOR TRICH, YEAST, CLUE
Clue Cell Exam: NEGATIVE
Trichomonas Exam: NEGATIVE
Yeast Exam: NEGATIVE

## 2020-11-12 LAB — MICROSCOPIC EXAMINATION
RBC, Urine: NONE SEEN /hpf (ref 0–2)
WBC, UA: NONE SEEN /hpf (ref 0–5)

## 2020-11-12 NOTE — Patient Instructions (Signed)
Vaginitis Vaginitis is a condition in which the vaginal tissue swells and becomes red (inflamed). This condition is most often caused by a change in the normal balance of bacteria and yeast that live in the vagina. This change causes an overgrowth of certain bacteria or yeast, which causes the inflammation. There are different types of vaginitis, but the most common types are:  Bacterial vaginosis.  Yeast infection (candidiasis).  Trichomoniasis vaginitis. This is a sexually transmitted disease (STD).  Viral vaginitis.  Atrophic vaginitis.  Allergic vaginitis. What are the causes? The cause of this condition depends on the type of vaginitis. It can be caused by:  Bacteria (bacterial vaginosis).  Yeast, which is a fungus (yeast infection).  A parasite (trichomoniasis vaginitis).  A virus (viral vaginitis).  Low hormone levels (atrophic vaginitis). Low hormone levels can occur during pregnancy, breastfeeding, or after menopause.  Irritants, such as bubble baths, scented tampons, and feminine sprays (allergic vaginitis). Other factors can change the normal balance of the yeast and bacteria that live in the vagina. These include:  Antibiotic medicines.  Poor hygiene.  Diaphragms, vaginal sponges, spermicides, birth control pills, and intrauterine devices (IUD).  Sex.  Infection.  Uncontrolled diabetes.  A weakened defense (immune) system. What increases the risk? This condition is more likely to develop in women who:  Smoke.  Use vaginal douches, scented tampons, or scented sanitary pads.  Wear tight-fitting pants.  Wear thong underwear.  Use oral birth control pills or an IUD.  Have sex without a condom.  Have multiple sex partners.  Have an STD.  Frequently use the spermicide nonoxynol-9.  Eat lots of foods high in sugar.  Have uncontrolled diabetes.  Have low estrogen levels.  Have a weakened immune system from an immune disorder or medical  treatment.  Are pregnant or breastfeeding. What are the signs or symptoms? Symptoms vary depending on the cause of the vaginitis. Common symptoms include:  Abnormal vaginal discharge. ? The discharge is white, gray, or yellow with bacterial vaginosis. ? The discharge is thick, white, and cheesy with a yeast infection. ? The discharge is frothy and yellow or greenish with trichomoniasis.  A bad vaginal smell. The smell is fishy with bacterial vaginosis.  Vaginal itching, pain, or swelling.  Sex that is painful.  Pain or burning when urinating. Sometimes there are no symptoms. How is this diagnosed? This condition is diagnosed based on your symptoms and medical history. A physical exam, including a pelvic exam, will also be done. You may also have other tests, including:  Tests to determine the pH level (acidity or alkalinity) of your vagina.  A whiff test, to assess the odor that results when a sample of your vaginal discharge is mixed with a potassium hydroxide solution.  Tests of vaginal fluid. A sample will be examined under a microscope. How is this treated? Treatment varies depending on the type of vaginitis you have. Your treatment may include:  Antibiotic creams or pills to treat bacterial vaginosis and trichomoniasis.  Antifungal medicines, such as vaginal creams or suppositories, to treat a yeast infection.  Medicine to ease discomfort if you have viral vaginitis. Your sexual partner should also be treated.  Estrogen delivered in a cream, pill, suppository, or vaginal ring to treat atrophic vaginitis. If vaginal dryness occurs, lubricants and moisturizing creams may help. You may need to avoid scented soaps, sprays, or douches.  Stopping use of a product that is causing allergic vaginitis. Then using a vaginal cream to treat the symptoms. Follow   these instructions at home: Lifestyle  Keep your genital area clean and dry. Avoid soap, and only rinse the area with  water.  Do not douche or use tampons until your health care provider says it is okay to do so. Use sanitary pads, if needed.  Do not have sex until your health care provider approves. When you can return to sex, practice safe sex and use condoms.  Wipe from front to back. This avoids the spread of bacteria from the rectum to the vagina. General instructions  Take over-the-counter and prescription medicines only as told by your health care provider.  If you were prescribed an antibiotic medicine, take or use it as told by your health care provider. Do not stop taking or using the antibiotic even if you start to feel better.  Keep all follow-up visits as told by your health care provider. This is important. How is this prevented?  Use mild, non-scented products. Do not use things that can irritate the vagina, such as fabric softeners. Avoid the following products if they are scented: ? Feminine sprays. ? Detergents. ? Tampons. ? Feminine hygiene products. ? Soaps or bubble baths.  Let air reach your genital area. ? Wear cotton underwear to reduce moisture buildup. ? Avoid wearing underwear while you sleep. ? Avoid wearing tight pants and underwear or nylons without a cotton panel. ? Avoid wearing thong underwear.  Take off any wet clothing, such as bathing suits, as soon as possible.  Practice safe sex and use condoms. Contact a health care provider if:  You have abdominal pain.  You have a fever.  You have symptoms that last for more than 2-3 days. Get help right away if:  You have a fever and your symptoms suddenly get worse. Summary  Vaginitis is a condition in which the vaginal tissue becomes inflamed.This condition is most often caused by a change in the normal balance of bacteria and yeast that live in the vagina.  Treatment varies depending on the type of vaginitis you have.  Do not douche, use tampons , or have sex until your health care provider approves. When  you can return to sex, practice safe sex and use condoms. This information is not intended to replace advice given to you by your health care provider. Make sure you discuss any questions you have with your health care provider. Document Revised: 10/23/2017 Document Reviewed: 12/16/2016 Elsevier Patient Education  2020 Elsevier Inc.  

## 2020-11-12 NOTE — Progress Notes (Signed)
Subjective: CC: dysuria PCP: Christine Pierini, FNP  EQA:STMHD [name] is a 44 y.o. female presenting to clinic today for:  1. Dysuria Christine [name] reports dysuria with urine frequency and pressure for about 10 days. She went to urgent care and was given Cipro and finished it up yesterday. She reports that her symptoms have improved but they have not resolved. She also reports vaginal irritation. She  enies vaginal discharge or itching. She is having issues with constipation due to her IBS and she does feel better after having a BM. She has been using miralax 1-2x a day and has had improvement in the pressure feeling since. She did take 3 diflucan pills with the cipro. Denies concern for STDs. Denies vaginal odor. She has had BV in the past and it felt similar to this. Denies fever, chills, blood in urine, or flank pain. Denies nausea or vomiting.   Relevant past medical, surgical, family, and social history reviewed and updated as indicated.  Allergies and medications reviewed and updated.  Allergies  Allergen Reactions  . Sulfa Antibiotics Hives  . Sulfasalazine Hives  . Doxycycline Hyclate   . Ivp Dye [Iodinated Diagnostic Agents] Hives  . Other     ALPHA GAL - MEAT ALLERGY   . Paxil [Paroxetine Hcl] Other (See Comments)    Made pt very angry   . Phosphorus Diarrhea   Past Medical History:  Diagnosis Date  . Asthma   . GAD (generalized anxiety disorder)   . Vitamin D deficiency     Current Outpatient Medications:  .  albuterol (PROVENTIL) (2.5 MG/3ML) 0.083% nebulizer solution, Take 3 mLs (2.5 mg total) by nebulization every 4 (four) hours as needed for wheezing or shortness of breath., Disp: 75 mL, Rfl: 1 .  ALPRAZolam (XANAX) 0.25 MG tablet, TAKE 1 TABLET BY MOUTH TWICE A DAY AS NEEDED (NEEDS TO BE SEEN), Disp: 20 tablet, Rfl: 1 .  Cholecalciferol (VITAMIN D3) 1000 units CAPS, Take by mouth., Disp: , Rfl:  .  ethynodiol-ethinyl estradiol (KELNOR 1/35) 1-35 MG-MCG tablet,  Take 1 tablet by mouth daily. (Needs to be seen before next refill), Disp: 28 tablet, Rfl: 0 .  fluticasone (FLOVENT HFA) 110 MCG/ACT inhaler, Inhale 2 puffs into the lungs 2 (two) times daily., Disp: 2 Inhaler, Rfl: 3 .  Fluticasone Propionate (XHANCE) 93 MCG/ACT EXHU, Place 2 sprays into the nose 2 (two) times daily., Disp: 32 mL, Rfl: 5 .  montelukast (SINGULAIR) 10 MG tablet, TAKE 1 TABLET BY MOUTH EVERYDAY AT BEDTIME, Disp: 90 tablet, Rfl: 1 .  pantoprazole (PROTONIX) 40 MG tablet, Take 1 tablet (40 mg total) by mouth daily. (Needs to be seen before next refill), Disp: 90 tablet, Rfl: 1 .  sertraline (ZOLOFT) 100 MG tablet, Take 1 tablet (100 mg total) by mouth daily., Disp: 90 tablet, Rfl: 0 .  SPIRIVA RESPIMAT 1.25 MCG/ACT AERS, INHALE 2 PUFFS BY MOUTH INTO THE LUNGS DAILY, Disp: 4 g, Rfl: 5 .  UNABLE TO FIND, , Disp: , Rfl:  .  VENTOLIN HFA 108 (90 Base) MCG/ACT inhaler, TAKE 2 PUFFS BY MOUTH EVERY 6 HOURS AS NEEDED FOR WHEEZE OR SHORTNESS OF BREATH, Disp: 18 g, Rfl: 1 .  zolpidem (AMBIEN) 10 MG tablet, Take 1 tablet (10 mg total) by mouth at bedtime as needed for sleep., Disp: 30 tablet, Rfl: 1 Social History   Socioeconomic History  . Marital status: Married    Spouse name: Not on file  . Number of children: Not on file  .  Years of education: Not on file  . Highest education level: Not on file  Occupational History  . Not on file  Tobacco Use  . Smoking status: Never Smoker  . Smokeless tobacco: Never Used  Vaping Use  . Vaping Use: Never used  Substance and Sexual Activity  . Alcohol use: Yes  . Drug use: No  . Sexual activity: Not on file  Other Topics Concern  . Not on file  Social History Narrative  . Not on file   Social Determinants of Health   Financial Resource Strain: Not on file  Food Insecurity: Not on file  Transportation Needs: Not on file  Physical Activity: Not on file  Stress: Not on file  Social Connections: Not on file  Intimate Partner Violence:  Not on file   Family History  Problem Relation Age of Onset  . Diabetes Mother   . Hypertension Mother   . Hyperlipidemia Father   . Hypertension Father   . Allergic rhinitis Neg Hx   . Angioedema Neg Hx   . Asthma Neg Hx   . Eczema Neg Hx   . Immunodeficiency Neg Hx   . Urticaria Neg Hx     Review of Systems  As per HPI.   Objective: Office vital signs reviewed. BP (!) 143/84   Pulse 73   Temp 98.3 F (36.8 C) (Temporal)   Ht 5\' 4"  (1.626 m)   Wt 186 lb 6 oz (84.5 kg)   BMI 31.99 kg/m   Physical Examination:  Physical Exam Vitals and nursing note reviewed.  Constitutional:      General: She is not in acute distress.    Appearance: Normal appearance. She is not ill-appearing, toxic-appearing or diaphoretic.  Cardiovascular:     Rate and Rhythm: Normal rate and regular rhythm.     Heart sounds: Normal heart sounds. No murmur heard.   Pulmonary:     Effort: Pulmonary effort is normal. No respiratory distress.     Breath sounds: Normal breath sounds.  Abdominal:     General: Bowel sounds are normal. There is no distension.     Palpations: Abdomen is soft.     Tenderness: There is no abdominal tenderness. There is no right CVA tenderness, left CVA tenderness, guarding or rebound.  Musculoskeletal:     Right lower leg: No edema.     Left lower leg: No edema.  Skin:    General: Skin is warm and dry.  Neurological:     General: No focal deficit present.     Mental Status: She is alert and oriented to person, place, and time.  Psychiatric:        Mood and Affect: Mood normal.        Behavior: Behavior normal.        Thought Content: Thought content normal.        Judgment: Judgment normal.    Urine dipstick shows negative for all components.  Micro exam: few bacteria.  Microscopic wet-mount exam shows Negative yeast, negative clue cells, negative trich, moderate bacteria.   Results for orders placed or performed in visit on 03/06/20  WET PREP FOR TRICH,  YEAST, CLUE   Specimen: Vaginal Fluid   VAGINAL FLUI  Result Value Ref Range   Trichomonas Exam Negative Negative   Yeast Exam Positive (A) Negative   Clue Cell Exam Negative Negative  Microscopic Examination   URINE  Result Value Ref Range   WBC, UA 0-5 0 - 5 /hpf  RBC 0-2 0 - 2 /hpf   Epithelial Cells (non renal) 0-10 0 - 10 /hpf   Renal Epithel, UA None seen None seen /hpf   Bacteria, UA Few None seen/Few  Urinalysis, Complete  Result Value Ref Range   Specific Gravity, UA 1.020 1.005 - 1.030   pH, UA 6.5 5.0 - 7.5   Color, UA Yellow Yellow   Appearance Ur Clear Clear   Leukocytes,UA Negative Negative   Protein,UA Negative Negative/Trace   Glucose, UA Negative Negative   Ketones, UA Negative Negative   RBC, UA Trace (A) Negative   Bilirubin, UA Negative Negative   Urobilinogen, Ur 0.2 0.2 - 1.0 mg/dL   Nitrite, UA Negative Negative   Microscopic Examination See below:      Assessment/ Plan: Christine [name] was seen today for dysuria.  Diagnoses and all orders for this visit:  Dysuria Culture pending, will treat with abx for positive culture. Discussed that pressure and frequency may be cause by constipation. Continue miralax daily.  -     Urinalysis, Complete -     Urine Culture -     Microscopic Examination  Vaginal irritation Wet prep does not indicate BV. Negative yeast and trich. Handout on vaginitis given.  -     WET PREP FOR TRICH, YEAST, CLUE  Return to office for new or worsening symptoms, or if symptoms persist.   Follow up as needed.   The above assessment and management plan was discussed with the patient. The patient verbalized understanding of and has agreed to the management plan. Patient is aware to call the clinic if symptoms persist or worsen. Patient is aware when to return to the clinic for a follow-up visit. Patient educated on when it is appropriate to go to the emergency department.   Harlow Mares, FNP-C [facility] [address] [place], [place] [address] [phone]

## 2020-11-13 LAB — URINE CULTURE: Organism ID, Bacteria: NO GROWTH

## 2020-11-14 ENCOUNTER — Other Ambulatory Visit: Payer: Self-pay | Admitting: Nurse Practitioner

## 2020-11-14 DIAGNOSIS — J4530 Mild persistent asthma, uncomplicated: Secondary | ICD-10-CM

## 2020-12-03 ENCOUNTER — Other Ambulatory Visit: Payer: Self-pay

## 2020-12-03 ENCOUNTER — Ambulatory Visit (INDEPENDENT_AMBULATORY_CARE_PROVIDER_SITE_OTHER): Payer: BC Managed Care – PPO | Admitting: Nurse Practitioner

## 2020-12-03 ENCOUNTER — Encounter: Payer: Self-pay | Admitting: Nurse Practitioner

## 2020-12-03 ENCOUNTER — Other Ambulatory Visit (HOSPITAL_COMMUNITY)
Admission: RE | Admit: 2020-12-03 | Discharge: 2020-12-03 | Disposition: A | Discharge status: Home / Self Care | Payer: 59 | Source: Ambulatory Visit | Attending: Nurse Practitioner | Admitting: Nurse Practitioner

## 2020-12-03 VITALS — BP 153/91 | HR 75 | Temp 97.6°F | Resp 20 | Ht 64.0 in | Wt 186.0 lb

## 2020-12-03 DIAGNOSIS — Z0001 Encounter for general adult medical examination with abnormal findings: Secondary | ICD-10-CM

## 2020-12-03 DIAGNOSIS — F411 Generalized anxiety disorder: Secondary | ICD-10-CM

## 2020-12-03 DIAGNOSIS — E559 Vitamin D deficiency, unspecified: Secondary | ICD-10-CM | POA: Diagnosis not present

## 2020-12-03 DIAGNOSIS — K219 Gastro-esophageal reflux disease without esophagitis: Secondary | ICD-10-CM | POA: Diagnosis not present

## 2020-12-03 DIAGNOSIS — Z0000 Encounter for general adult medical examination without abnormal findings: Secondary | ICD-10-CM

## 2020-12-03 DIAGNOSIS — Z136 Encounter for screening for cardiovascular disorders: Secondary | ICD-10-CM | POA: Diagnosis not present

## 2020-12-03 DIAGNOSIS — J4530 Mild persistent asthma, uncomplicated: Secondary | ICD-10-CM | POA: Diagnosis not present

## 2020-12-03 DIAGNOSIS — N760 Acute vaginitis: Secondary | ICD-10-CM

## 2020-12-03 DIAGNOSIS — R030 Elevated blood-pressure reading, without diagnosis of hypertension: Secondary | ICD-10-CM | POA: Diagnosis not present

## 2020-12-03 DIAGNOSIS — M26629 Arthralgia of temporomandibular joint, unspecified side: Secondary | ICD-10-CM

## 2020-12-03 DIAGNOSIS — Z6832 Body mass index (BMI) 32.0-32.9, adult: Secondary | ICD-10-CM

## 2020-12-03 DIAGNOSIS — F5101 Primary insomnia: Secondary | ICD-10-CM

## 2020-12-03 LAB — MICROSCOPIC EXAMINATION: RBC, Urine: NONE SEEN /hpf (ref 0–2)

## 2020-12-03 LAB — URINALYSIS, COMPLETE
Bilirubin, UA: NEGATIVE
Glucose, UA: NEGATIVE
Ketones, UA: NEGATIVE
Leukocytes,UA: NEGATIVE
Nitrite, UA: NEGATIVE
Protein,UA: NEGATIVE
RBC, UA: NEGATIVE
Specific Gravity, UA: 1.02 (ref 1.005–1.030)
Urobilinogen, Ur: 0.2 mg/dL (ref 0.2–1.0)
pH, UA: 6.5 (ref 5.0–7.5)

## 2020-12-03 MED ORDER — ALPRAZOLAM 0.25 MG PO TABS: ORAL_TABLET | ORAL | 1 refills | Status: DC

## 2020-12-03 MED ORDER — SPIRIVA RESPIMAT 1.25 MCG/ACT IN AERS: 1.0000 | INHALATION_SPRAY | Freq: Every day | RESPIRATORY_TRACT | 5 refills | Status: DC

## 2020-12-03 MED ORDER — PANTOPRAZOLE SODIUM 40 MG PO TBEC: 40.0000 mg | DELAYED_RELEASE_TABLET | Freq: Every day | ORAL | 1 refills | Status: DC

## 2020-12-03 MED ORDER — ZOLPIDEM TARTRATE 10 MG PO TABS: 10.0000 mg | ORAL_TABLET | Freq: Every evening | ORAL | 1 refills | Status: DC | PRN

## 2020-12-03 MED ORDER — MONTELUKAST SODIUM 10 MG PO TABS: 10.0000 mg | ORAL_TABLET | Freq: Every day | ORAL | 1 refills | Status: DC

## 2020-12-03 MED ORDER — SERTRALINE HCL 100 MG PO TABS
100.0000 mg | ORAL_TABLET | Freq: Every day | ORAL | 0 refills | Status: DC
Start: 2020-12-03 — End: 2021-05-09

## 2020-12-03 NOTE — Addendum Note (Signed)
Addended by: Cleda Daub on: 12/03/2020 03:43 PM   Modules accepted: Orders

## 2020-12-03 NOTE — Addendum Note (Signed)
Addended by: Bennie Pierini on: 12/03/2020 03:46 PM   Modules accepted: Orders

## 2020-12-03 NOTE — Patient Instructions (Signed)
Atrophic Vaginitis  Atrophic vaginitis is when the lining of the vagina becomes dry and thin. This is most common in women who have stopped having their periods (are in menopause). It usually starts when a woman is 45 to 47 years old. What are the causes? This condition is caused by a drop in a female hormone (estrogen). What increases the risk? You are more likely to develop this condition if:  You take certain medicines.  You have had your ovaries taken out.  You are being treated for cancer.  You have given birth or are breastfeeding.  You are more than 47 years old.  You smoke. What are the signs or symptoms?  Pain during sex.  A feeling of pressure during sex.  Bleeding during sex.  Burning or itching in the vagina.  Burning pain when you pee (urinate).  Fluid coming from your vagina. Some people do not have symptoms. How is this treated?  Using a lubricant before sex.  Using a moisturizer in the vagina.  Using estrogen in the vagina. In some cases, you may not need treatment. Follow these instructions at home: Medicines  Take all medicines only as told by your doctor. This includes medicines for dryness.  Do not use herbal medicines unless your doctor says it is okay. General instructions  Talk with your doctor about treatment.  Do not douche.  Do not use scented: ? Sprays. ? Tampons. ? Soaps.  If sex hurts, try using lubricants right before you have sex. Contact a doctor if:  You have fluid coming from the vagina that is not like normal.  You have a bad smell coming from your vagina.  You have new symptoms.  Your symptoms do not get better when treated.  Your symptoms get worse. Summary  This condition happens when the lining of the vagina becomes dry and thin.  It is most common in women who no longer have periods.  Treatment may include using medicines for dryness.  Call a doctor if your symptoms do not get better. This  information is not intended to replace advice given to you by your health care provider. Make sure you discuss any questions you have with your health care provider. Document Revised: 05/10/2020 Document Reviewed: 05/10/2020 Elsevier Patient Education  2021 Elsevier Inc.   

## 2020-12-03 NOTE — Progress Notes (Addendum)
Subjective:    Patient ID: Christine [name], female    DOB: Oct 23, 1976, 45 y.o.   MRN: [id number]   Chief Complaint: Annual Exam    HPI:  1. Annual physical exam In today for annual physicall and pap exam.  2. White coat syndrome with high blood pressure without hypertension Blood pressure here is always high. Always good when she checks it at home.  3. Mild persistent asthma, uncomplicated Doing well. Has not needed her flovent in awhile.  4. Gastroesophageal reflux disease, unspecified whether esophagitis present Is on protonix but she has actually switched to the mediterranean diet and it is doing better.  5. TMJ pain dysfunction syndrome Doing ok. Has ot really been bothering her lately.  6. Generalized anxiety disorder She is on xanax and is not doing well. Stays very anxious. sheis also on zoloft and conbination works ok. GAD 7 : Generalized Anxiety Score 12/03/2020 09/13/2020 05/01/2020 08/12/2019  Nervous, Anxious, on Edge 3 0 0 2  Control/stop worrying 0 0 0 1  Worry too much - different things 0 0 0 1  Trouble relaxing 1 0 0 2  Restless 1 0 0 1  Easily annoyed or irritable 1 0 0 1  Afraid - awful might happen 1 0 0 0  Total GAD 7 Score 7 0 0 8  Anxiety Difficulty Not difficult at all Not difficult at all Not difficult at all Somewhat difficult    Depression screen [facility] 2/9 12/03/2020 11/12/2020 09/13/2020  Decreased Interest 0 0 0  Down, Depressed, Hopeless 0 0 0  PHQ - 2 Score 0 0 0  Altered sleeping 0 - -  Tired, decreased energy 0 - -  Change in appetite 0 - -  Feeling bad or failure about yourself  0 - -  Trouble concentrating 0 - -  Moving slowly or fidgety/restless 0 - -  Suicidal thoughts 0 - -  PHQ-9 Score 0 - -  Difficult doing work/chores Not difficult at all - -  Some recent data might be hidden     7. Primary insomnia Is on ambien to sleep at night and sleeps about 8-9 hours a night. Usually feels rested in mornings.  8. Vitamin D  deficiency Is on daily vitamin d supplement  9. BMI 32.0-32.9,adult No recent weight changes Wt Readings from Last 3 Encounters:  12/03/20 186 lb (84.4 kg)  11/12/20 186 lb 6 oz (84.5 kg)  09/13/20 197 lb (89.4 kg)   BMI Readings from Last 3 Encounters:  12/03/20 31.93 kg/m  11/12/20 31.99 kg/m  09/13/20 33.81 kg/m       Outpatient Encounter Medications as of 12/03/2020  Medication Sig  . albuterol (PROVENTIL) (2.5 MG/3ML) 0.083% nebulizer solution Take 3 mLs (2.5 mg total) by nebulization every 4 (four) hours as needed for wheezing or shortness of breath.  . ALPRAZolam (XANAX) 0.25 MG tablet TAKE 1 TABLET BY MOUTH TWICE A DAY AS NEEDED (NEEDS TO BE SEEN)  . Cholecalciferol (VITAMIN D3) 1000 units CAPS Take by mouth.  . fluticasone (FLOVENT HFA) 110 MCG/ACT inhaler Inhale 2 puffs into the lungs 2 (two) times daily.  . Fluticasone Propionate (XHANCE) 93 MCG/ACT EXHU Place 2 sprays into the nose 2 (two) times daily.  Marland Kitchen [facility] 1/35 1-35 MG-MCG tablet TAKE 1 TABLET BY MOUTH DAILY. (NEEDS TO BE SEEN BEFORE NEXT REFILL)  . montelukast (SINGULAIR) 10 MG tablet TAKE 1 TABLET BY MOUTH EVERYDAY AT BEDTIME  . pantoprazole (PROTONIX) 40 MG tablet Take 1 tablet (  40 mg total) by mouth daily. (Needs to be seen before next refill)  . sertraline (ZOLOFT) 100 MG tablet Take 1 tablet (100 mg total) by mouth daily.  Marland Kitchen SPIRIVA RESPIMAT 1.25 MCG/ACT AERS INHALE 2 PUFFS BY MOUTH INTO THE LUNGS DAILY  . UNABLE TO FIND   . VENTOLIN HFA 108 (90 Base) MCG/ACT inhaler TAKE 2 PUFFS BY MOUTH EVERY 6 HOURS AS NEEDED FOR WHEEZE OR SHORTNESS OF BREATH  . zolpidem (AMBIEN) 10 MG tablet Take 1 tablet (10 mg total) by mouth at bedtime as needed for sleep.     Past Surgical History:  Procedure Laterality Date  . BREAST BIOPSY Right 07/2015   fibrocystic changes  . CESAREAN SECTION    . TONSILLECTOMY AND ADENOIDECTOMY      Family History  Problem Relation Age of Onset  . Diabetes Mother   . Hypertension  Mother   . Hyperlipidemia Father   . Hypertension Father   . Allergic rhinitis Neg Hx   . Angioedema Neg Hx   . Asthma Neg Hx   . Eczema Neg Hx   . Immunodeficiency Neg Hx   . Urticaria Neg Hx     New complaints: LMP was 3 weeks ago. She says she is still regular but only last a couple of days. She is concerned that she has vaginal dryness.  Social history: Lives with her husband  Controlled substance contract: n/a    Review of Systems  Constitutional: Negative for diaphoresis.  Eyes: Negative for pain.  Respiratory: Negative for shortness of breath.   Cardiovascular: Negative for chest pain, palpitations and leg swelling.  Gastrointestinal: Negative for abdominal pain.  Endocrine: Negative for polydipsia.  Skin: Negative for rash.  Neurological: Negative for dizziness, weakness and headaches.  Hematological: Does not bruise/bleed easily.  All other systems reviewed and are negative.      Objective:   Physical Exam Vitals and nursing note reviewed.  Constitutional:      General: She is not in acute distress.    Appearance: Normal appearance. She is well-developed and well-nourished.  HENT:     Head: Normocephalic.     Nose: Nose normal.     Mouth/Throat:     Mouth: Oropharynx is clear and moist.  Eyes:     Extraocular Movements: EOM normal.     Pupils: Pupils are equal, round, and reactive to light.  Neck:     Vascular: No carotid bruit or JVD.  Cardiovascular:     Rate and Rhythm: Normal rate and regular rhythm.     Pulses: Intact distal pulses.     Heart sounds: Normal heart sounds.  Pulmonary:     Effort: Pulmonary effort is normal. No respiratory distress.     Breath sounds: Normal breath sounds. No wheezing or rales.  Chest:     Chest wall: No tenderness.  Abdominal:     General: Bowel sounds are normal. There is no distension or abdominal bruit. Aorta is normal.     Palpations: Abdomen is soft. There is no hepatomegaly, splenomegaly, mass or  pulsatile mass.     Tenderness: There is no abdominal tenderness.  Genitourinary:    General: Normal vulva.     Vagina: No vaginal discharge.     Rectum: Normal.     Comments: Mild vaginal dryness No adnexal massses or tenderness Cervix is nonparous and pink Musculoskeletal:        General: No edema. Normal range of motion.     Cervical back: Normal range  of motion and neck supple.  Lymphadenopathy:     Cervical: No cervical adenopathy.  Skin:    General: Skin is warm and dry.  Neurological:     Mental Status: She is alert and oriented to person, place, and time.     Deep Tendon Reflexes: Reflexes are normal and symmetric.  Psychiatric:        Mood and Affect: Mood and affect normal.        Behavior: Behavior normal.        Thought Content: Thought content normal.        Judgment: Judgment normal.    BP (!) 153/91   Pulse 75   Temp 97.6 F (36.4 C) (Temporal)   Resp 20   Ht 5' 4"  (1.626 m)   Wt 186 lb (84.4 kg)   SpO2 99%   BMI 31.93 kg/m         Assessment & Plan:  Christine [name] comes in today with chief complaint of Annual Exam   Diagnosis and orders addressed:  1. Annual physical exam - Urinalysis, Complete - CBC with Differential/Platelet - CMP14+EGFR - Lipid panel - Thyroid Panel With TSH - Cytology - PAP  2. White coat syndrome with high blood pressure without hypertension Keep check of blood pressure  3. Mild persistent asthma, uncomplicated Avoid cigarette smoke - montelukast (SINGULAIR) 10 MG tablet; Take 1 tablet (10 mg total) by mouth at bedtime.  Dispense: 90 tablet; Refill: 1 - Tiotropium Bromide Monohydrate (SPIRIVA RESPIMAT) 1.25 MCG/ACT AERS; Inhale 1 puff into the lungs daily.  Dispense: 4 g; Refill: 5  4. Gastroesophageal reflux disease, unspecified whether esophagitis present Avoid spicy foods Do not eat 2 hours prior to bedtime - pantoprazole (PROTONIX) 40 MG tablet; Take 1 tablet (40 mg total) by mouth daily. (Needs to be seen  before next refill)  Dispense: 90 tablet; Refill: 1   5. TMJ pain dysfunction syndrome  6. Generalized anxiety disorder stress management - sertraline (ZOLOFT) 100 MG tablet; Take 1 tablet (100 mg total) by mouth daily.  Dispense: 90 tablet; Refill: 0 - ALPRAZolam (XANAX) 0.25 MG tablet; TAKE 1 TABLET BY MOUTH TWICE A DAY AS NEEDED (NEEDS TO BE SEEN)  Dispense: 20 tablet; Refill: 1  7. Primary insomnia Bedtime routine - zolpidem (AMBIEN) 10 MG tablet; Take 1 tablet (10 mg total) by mouth at bedtime as needed for sleep.  Dispense: 30 tablet; Refill: 1  8. Vitamin D deficiency Vitamin d supplement  9. BMI 32.0-32.9,adult Discussed diet and exercise for person with BMI >25 Will recheck weight in 3-6 months  10. Vaginitis k-y jelly for lubrication Estrogen levels to be checked today   Labs pending Health Maintenance reviewed Diet and exercise encouraged  Follow up plan: 6 months   [place], FNP

## 2020-12-04 ENCOUNTER — Telehealth: Payer: Self-pay

## 2020-12-04 LAB — CMP14+EGFR
ALT: 11 IU/L (ref 0–32)
AST: 14 IU/L (ref 0–40)
Albumin/Globulin Ratio: 1.6 (ref 1.2–2.2)
Albumin: 4.2 g/dL (ref 3.8–4.8)
Alkaline Phosphatase: 89 IU/L (ref 44–121)
BUN/Creatinine Ratio: 11 (ref 9–23)
BUN: 8 mg/dL (ref 6–24)
Bilirubin Total: 0.2 mg/dL (ref 0.0–1.2)
CO2: 22 mmol/L (ref 20–29)
Calcium: 9 mg/dL (ref 8.7–10.2)
Chloride: 103 mmol/L (ref 96–106)
Creatinine, Ser: 0.76 mg/dL (ref 0.57–1.00)
GFR calc Af Amer: 109 mL/min/{1.73_m2} (ref >59)
GFR calc non Af Amer: 94 mL/min/{1.73_m2} (ref >59)
Globulin, Total: 2.7 g/dL (ref 1.5–4.5)
Glucose: 89 mg/dL (ref 65–99)
Potassium: 4.5 mmol/L (ref 3.5–5.2)
Sodium: 139 mmol/L (ref 134–144)
Total Protein: 6.9 g/dL (ref 6.0–8.5)

## 2020-12-04 LAB — CBC WITH DIFFERENTIAL/PLATELET
Basophils Absolute: 0.1 10*3/uL (ref 0.0–0.2)
Basos: 1 %
EOS (ABSOLUTE): 0.1 10*3/uL (ref 0.0–0.4)
Eos: 2 %
Hematocrit: 38.2 % (ref 34.0–46.6)
Hemoglobin: 13 g/dL (ref 11.1–15.9)
Immature Grans (Abs): 0 10*3/uL (ref 0.0–0.1)
Immature Granulocytes: 0 %
Lymphocytes Absolute: 2.4 10*3/uL (ref 0.7–3.1)
Lymphs: 31 %
MCH: 29.3 pg (ref 26.6–33.0)
MCHC: 34 g/dL (ref 31.5–35.7)
MCV: 86 fL (ref 79–97)
Monocytes Absolute: 0.4 10*3/uL (ref 0.1–0.9)
Monocytes: 6 %
Neutrophils Absolute: 4.7 10*3/uL (ref 1.4–7.0)
Neutrophils: 60 %
Platelets: 294 10*3/uL (ref 150–450)
RBC: 4.44 x10E6/uL (ref 3.77–5.28)
RDW: 13.9 % (ref 11.7–15.4)
WBC: 7.6 10*3/uL (ref 3.4–10.8)

## 2020-12-04 LAB — LIPID PANEL
Chol/HDL Ratio: 5.2 ratio — ABNORMAL HIGH (ref 0.0–4.4)
Cholesterol, Total: 262 mg/dL — ABNORMAL HIGH (ref 100–199)
HDL: 50 mg/dL (ref >39)
LDL Chol Calc (NIH): 176 mg/dL — ABNORMAL HIGH (ref 0–99)
Triglycerides: 194 mg/dL — ABNORMAL HIGH (ref 0–149)
VLDL Cholesterol Cal: 36 mg/dL (ref 5–40)

## 2020-12-04 LAB — THYROID PANEL WITH TSH
Free Thyroxine Index: 1.8 (ref 1.2–4.9)
T3 Uptake Ratio: 18 % — ABNORMAL LOW (ref 24–39)
T4, Total: 10.1 ug/dL (ref 4.5–12.0)
TSH: 0.727 u[IU]/mL (ref 0.450–4.500)

## 2020-12-04 LAB — VITAMIN D 25 HYDROXY (VIT D DEFICIENCY, FRACTURES): Vit D, 25-Hydroxy: 72.5 ng/mL (ref 30.0–100.0)

## 2020-12-04 NOTE — Telephone Encounter (Signed)
Patient aware that labs have not been reviewed yet by the provider.  Advised patient that we would call her when her lab work is advised.  Patient is concerned about UA.

## 2020-12-06 ENCOUNTER — Other Ambulatory Visit: Payer: Self-pay | Admitting: Nurse Practitioner

## 2020-12-06 DIAGNOSIS — J4530 Mild persistent asthma, uncomplicated: Secondary | ICD-10-CM

## 2020-12-06 LAB — AMENORRHEA PROFILE
FSH: 1.4 m[IU]/mL
LH: 0.4 m[IU]/mL
Prolactin: 10.3 ng/mL (ref 4.8–23.3)

## 2020-12-06 LAB — SPECIMEN STATUS REPORT

## 2020-12-07 LAB — CYTOLOGY - PAP: Diagnosis: NEGATIVE

## 2020-12-17 ENCOUNTER — Encounter: Payer: BC Managed Care – PPO | Admitting: Nurse Practitioner

## 2021-01-02 ENCOUNTER — Other Ambulatory Visit: Payer: Self-pay | Admitting: Nurse Practitioner

## 2021-01-02 DIAGNOSIS — Z0000 Encounter for general adult medical examination without abnormal findings: Secondary | ICD-10-CM

## 2021-01-27 ENCOUNTER — Other Ambulatory Visit: Payer: Self-pay | Admitting: Nurse Practitioner

## 2021-01-27 DIAGNOSIS — F5101 Primary insomnia: Secondary | ICD-10-CM

## 2021-01-28 DIAGNOSIS — Z6831 Body mass index (BMI) 31.0-31.9, adult: Secondary | ICD-10-CM | POA: Diagnosis not present

## 2021-01-28 DIAGNOSIS — N898 Other specified noninflammatory disorders of vagina: Secondary | ICD-10-CM | POA: Diagnosis not present

## 2021-03-07 ENCOUNTER — Encounter: Payer: Self-pay | Admitting: *Deleted

## 2021-03-07 ENCOUNTER — Encounter: Payer: Self-pay | Admitting: Allergy & Immunology

## 2021-03-07 ENCOUNTER — Emergency Department (HOSPITAL_BASED_OUTPATIENT_CLINIC_OR_DEPARTMENT_OTHER)
Admission: EM | Admit: 2021-03-07 | Discharge: 2021-03-08 | Disposition: A | Discharge status: Home / Self Care | Payer: BC Managed Care – PPO | Attending: Emergency Medicine | Admitting: Emergency Medicine

## 2021-03-07 ENCOUNTER — Ambulatory Visit: Payer: BC Managed Care – PPO | Admitting: Allergy & Immunology

## 2021-03-07 ENCOUNTER — Other Ambulatory Visit: Payer: Self-pay

## 2021-03-07 ENCOUNTER — Emergency Department (HOSPITAL_BASED_OUTPATIENT_CLINIC_OR_DEPARTMENT_OTHER): Payer: BC Managed Care – PPO | Admitting: Radiology

## 2021-03-07 ENCOUNTER — Encounter (HOSPITAL_BASED_OUTPATIENT_CLINIC_OR_DEPARTMENT_OTHER): Payer: Self-pay | Admitting: Emergency Medicine

## 2021-03-07 ENCOUNTER — Telehealth: Payer: Self-pay | Admitting: Allergy & Immunology

## 2021-03-07 VITALS — BP 146/88 | HR 82 | Temp 99.8°F | Resp 20

## 2021-03-07 DIAGNOSIS — J3089 Other allergic rhinitis: Secondary | ICD-10-CM | POA: Diagnosis not present

## 2021-03-07 DIAGNOSIS — R0602 Shortness of breath: Secondary | ICD-10-CM | POA: Diagnosis not present

## 2021-03-07 DIAGNOSIS — R079 Chest pain, unspecified: Secondary | ICD-10-CM | POA: Insufficient documentation

## 2021-03-07 DIAGNOSIS — J4530 Mild persistent asthma, uncomplicated: Secondary | ICD-10-CM

## 2021-03-07 DIAGNOSIS — R1013 Epigastric pain: Secondary | ICD-10-CM | POA: Diagnosis not present

## 2021-03-07 DIAGNOSIS — K219 Gastro-esophageal reflux disease without esophagitis: Secondary | ICD-10-CM | POA: Diagnosis not present

## 2021-03-07 DIAGNOSIS — Z7951 Long term (current) use of inhaled steroids: Secondary | ICD-10-CM | POA: Insufficient documentation

## 2021-03-07 DIAGNOSIS — J302 Other seasonal allergic rhinitis: Secondary | ICD-10-CM

## 2021-03-07 DIAGNOSIS — R7989 Other specified abnormal findings of blood chemistry: Secondary | ICD-10-CM

## 2021-03-07 DIAGNOSIS — R0789 Other chest pain: Secondary | ICD-10-CM | POA: Diagnosis not present

## 2021-03-07 DIAGNOSIS — R791 Abnormal coagulation profile: Secondary | ICD-10-CM | POA: Diagnosis not present

## 2021-03-07 LAB — TROPONIN I (HIGH SENSITIVITY)
Troponin I (High Sensitivity): <2 ng/L (ref <18)
Troponin I (High Sensitivity): <2 ng/L (ref <18)

## 2021-03-07 LAB — CBC
HCT: 41.5 % (ref 36.0–46.0)
Hemoglobin: 13.8 g/dL (ref 12.0–15.0)
MCH: 28.3 pg (ref 26.0–34.0)
MCHC: 33.3 g/dL (ref 30.0–36.0)
MCV: 85.2 fL (ref 80.0–100.0)
Platelets: 304 10*3/uL (ref 150–400)
RBC: 4.87 MIL/uL (ref 3.87–5.11)
RDW: 13.5 % (ref 11.5–15.5)
WBC: 9.1 10*3/uL (ref 4.0–10.5)
nRBC: 0 % (ref 0.0–0.2)

## 2021-03-07 LAB — BASIC METABOLIC PANEL
Anion gap: 11 (ref 5–15)
BUN: 8 mg/dL (ref 6–20)
CO2: 22 mmol/L (ref 22–32)
Calcium: 10.1 mg/dL (ref 8.9–10.3)
Chloride: 100 mmol/L (ref 98–111)
Creatinine, Ser: 0.72 mg/dL (ref 0.44–1.00)
GFR, Estimated: >60 mL/min (ref >60)
Glucose, Bld: 84 mg/dL (ref 70–99)
Potassium: 3.4 mmol/L — ABNORMAL LOW (ref 3.5–5.1)
Sodium: 133 mmol/L — ABNORMAL LOW (ref 135–145)

## 2021-03-07 LAB — D-DIMER, QUANTITATIVE: D-DIMER: 1.14 mg/L FEU — ABNORMAL HIGH (ref 0.00–0.49)

## 2021-03-07 LAB — PREGNANCY, URINE: Preg Test, Ur: NEGATIVE

## 2021-03-07 MED ORDER — DIPHENHYDRAMINE HCL 50 MG/ML IJ SOLN
50.0000 mg | Freq: Once | INTRAMUSCULAR | Status: AC
  Administered 2021-03-07: 50 mg via INTRAVENOUS
  Filled 2021-03-07: qty 1

## 2021-03-07 MED ORDER — FAMOTIDINE 40 MG PO TABS: 40.0000 mg | ORAL_TABLET | Freq: Two times a day (BID) | ORAL | 5 refills | Status: DC

## 2021-03-07 MED ORDER — HYDROCORTISONE NA SUCCINATE PF 250 MG IJ SOLR: 200.0000 mg | Freq: Once | INTRAMUSCULAR | Status: DC

## 2021-03-07 MED ORDER — ALBUTEROL SULFATE (2.5 MG/3ML) 0.083% IN NEBU: 2.5000 mg | INHALATION_SOLUTION | RESPIRATORY_TRACT | 1 refills | Status: DC | PRN

## 2021-03-07 MED ORDER — STIOLTO RESPIMAT 2.5-2.5 MCG/ACT IN AERS: 2.0000 | INHALATION_SPRAY | Freq: Two times a day (BID) | RESPIRATORY_TRACT | 5 refills | Status: DC

## 2021-03-07 MED ORDER — DIPHENHYDRAMINE HCL 25 MG PO CAPS: 50.0000 mg | ORAL_CAPSULE | Freq: Once | ORAL | Status: AC

## 2021-03-07 MED ORDER — ALUM & MAG HYDROXIDE-SIMETH 200-200-20 MG/5ML PO SUSP
30.0000 mL | Freq: Once | ORAL | Status: AC
  Administered 2021-03-07: 30 mL via ORAL
  Filled 2021-03-07: qty 30

## 2021-03-07 MED ORDER — HYDROCORTISONE NA SUCCINATE PF 100 MG IJ SOLR
200.0000 mg | Freq: Once | INTRAMUSCULAR | Status: AC
  Administered 2021-03-07: 200 mg via INTRAVENOUS
  Filled 2021-03-07: qty 4

## 2021-03-07 NOTE — Telephone Encounter (Signed)
I called patient back with her lab results.  Her D-dimer was 1.14 which is more than twice normal.  With the shortness of breath that was not responsive to albuterol and her normal spirometry, I did feel better at if she went to the emergency room.  She is planning to go to [place] to the ED there.  Malachi Bonds, MD [facility]

## 2021-03-07 NOTE — Progress Notes (Signed)
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 Moderate persistent asthma, uncomplicated - with history of thrush - Lung testing looked great today. - We are going to get a D-dimer to rule out a pulmonary embolism.  - I have a relatively low inclination to think that this is what is going on. - We are going to stop Spiriva for now and start Stiolto two puffs twice daily (samples provided).  - Start the prednisone pack if you are not feeling better in a couple of days. - Daily controller medication(s): Singulair 10mg  daily and Stiolto two puffs once daily - Prior to physical activity: albuterol 2 puffs 10-15 minutes before physical activity. - Rescue medications: albuterol 4 puffs every 4-6 hours as needed or albuterol nebulizer one vial every 4-6 hours as needed - Asthma control goals:  * Full participation in all desired activities (may need albuterol before activity) * Albuterol use two time or less a week on average (not counting use with activity) * Cough interfering with sleep two time or less a month * Oral steroids no more than once a year * No hospitalizations  2. Reflux - Continue with Protonix 40 mg once a day for reflux. - Continue with Tums as needed.  - Add on Pepcid 20mg  twice daily for TWO WEEKS to see if this helps with your SOB.   3. Seasonal and perennial allergic rhinitis - Continue with nasal saline rinses - Continue with Xhance two sprays per nostril daily. - Continue the cetirizine 10mg  daily.   4. Alpha gal allergy - is up to date.  - Continue to avoid red meats.   5. Return in  about 6 months (around 09/06/2021).   Subjective:   Christine [name] is a 45 y.o. female presenting today for follow up of  Chief Complaint  Patient presents with  . Wheezing    X 1 week     Christine [name] has a history of the following: Patient Active Problem List   Diagnosis Date Noted  . Weight gain due to medication 09/13/2020  . Insomnia 04/27/2020  . Gastroesophageal reflux disease 09/13/2018  . TMJ pain dysfunction syndrome 09/25/2017  . Allergic rhinitis 02/18/2016  . White coat syndrome with high blood pressure without hypertension 09/06/2015  . BMI 32.0-32.9,adult 09/06/2015  . Vulvitis 11/24/2014  . Mammographic breast lesion 11/24/2014  . Mild persistent asthma, uncomplicated 02/10/2013  . Generalized anxiety disorder 02/10/2013  . Vitamin D deficiency     History obtained from: chart review and patient.  Christine [name] is a 45 y.o. female presenting for a follow up visit.  She was last seen in March 2021.  At that time, her lung testing looked great.  We will continue with Singulair 10 mg daily as well as Spiriva 1.25 mcg 2 puffs once daily.  For her reflux, she was continued on Protonix 40 mg daily and Pepcid 20 mg twice daily as needed.  For her rhinitis, we continue with nasal saline rinses, XHANCE, and cetirizine.  Her Auvi-Q was up-to-date for her alpha gal allergy.  Since the last visit, she has done well.   Asthma/Respiratory Symptom History: She  does report some chest tightness. She was at a beachhouse for one week and had a musty exposure there. They were on [facility] this time. She remains on Spiriva two puffs twice daily. She was on Flovent at one point, but she is very prone to thrush. She has GERD and takes Protonix for this. She is also on the Tums as needed. The smell at the beachhouse did not bother anyone else. She denies that the rescue inhaler does anything. She has not needed prednisone in quite some time. The last time that she came in for a checkup, she  was doing very well. She has mostly done well since the last visit aside from the last 7-10 days or so. She does not take famotidine for her reflux at all. She has tried using albuterol for her symptoms, but this has not provided any relief at all.  She does have a history of IBS that is triggered by stress. She has Xanax to use as needed for her anxiety flares. She thinks that this might be related to it. It is always difficult for her to figure out what comes first - anxiety or the SOB or the stomach pain. She again reports that the albuterol never helped with this current episode of SOB. She has not history of blood clots and denies leg pain. She has never had COVID19 to her knowledge and she does not have a family history of blood clots.   Allergic Rhinitis Symptom History: She remains on the [place] twice daily. She is on cetirizine daily. This combination is working well. She has never been on allergy shots She has not needed antibiotics at all.   She works from home aside from Wednesdays when she goes to [place]. She is there from 10am to 3pm. 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    Review of Systems  Constitutional: Negative.  Negative for fever, malaise/fatigue and weight loss.  HENT: Negative.  Negative for congestion, ear discharge and ear pain.   Eyes: Negative for pain, discharge and redness.  Respiratory: Positive for shortness of breath. Negative for cough, sputum production and wheezing.   Cardiovascular: Negative.  Negative for chest pain and palpitations.  Gastrointestinal: Negative for abdominal pain, constipation, diarrhea, heartburn, nausea and vomiting.  Skin: Negative.  Negative for itching and rash.  Neurological: Negative for dizziness and headaches.  Endo/Heme/Allergies: Negative for environmental allergies. Does not bruise/bleed  easily.       Objective:   Blood pressure (!) 146/88, pulse 82, temperature 99.8 F (37.7 C), temperature source Temporal, resp. rate 20, SpO2 99 %. There is no height or weight on file to calculate BMI.   Physical Exam:  Physical Exam Constitutional:      Appearance: She is well-developed.     Comments: Very pleasant.   HENT:     Head: Normocephalic and atraumatic.     Right Ear: Tympanic membrane, ear canal and external ear normal.     Left Ear: Tympanic membrane, ear canal and external ear normal.     Nose: No nasal deformity, septal deviation, mucosal edema or rhinorrhea.     Right Turbinates: Enlarged and swollen.     Left Turbinates: Enlarged and swollen.     Right Sinus: No maxillary sinus tenderness or frontal sinus tenderness.     Left Sinus: No maxillary sinus tenderness or  frontal sinus tenderness.     Mouth/Throat:     Mouth: Mucous membranes are not pale and not dry.     Pharynx: Uvula midline.  Eyes:     General:        Right eye: No discharge.        Left eye: No discharge.     Conjunctiva/sclera: Conjunctivae normal.     Right eye: Right conjunctiva is not injected. No chemosis.    Left eye: Left conjunctiva is not injected. No chemosis.    Pupils: Pupils are equal, round, and reactive to light.  Cardiovascular:     Rate and Rhythm: Normal rate and regular rhythm.     Heart sounds: Normal heart sounds.  Pulmonary:     Effort: Pulmonary effort is normal. No tachypnea, accessory muscle usage or respiratory distress.     Breath sounds: Normal breath sounds. No wheezing, rhonchi or rales.     Comments: Moving air well in all lung fields. No increased work of breathing noted. No crackles or wheezes noted. Not tachypneic. Chest:     Chest wall: No tenderness.  Lymphadenopathy:     Cervical: No cervical adenopathy.  Skin:    General: Skin is warm.     Capillary Refill: Capillary refill takes less than 2 seconds.     Coloration: Skin is not pale.      Findings: No abrasion, erythema, petechiae or rash. Rash is not papular, urticarial or vesicular.     Comments: No eczematous or urticarial lesions noted.   Neurological:     Mental Status: She is alert.  Psychiatric:        Behavior: Behavior is cooperative.      Diagnostic studies:    Spirometry: results normal (FEV1: 2.14/76%, FVC: 2.95/84%, FEV1/FVC: 73%).    Spirometry consistent with normal pattern.  Allergy Studies: none       Malachi Bonds, MD  [facility]

## 2021-03-07 NOTE — ED Provider Notes (Signed)
[facility] EMERGENCY DEPT Provider Note   CSN: [id number] Arrival date & time: 03/07/21  1728     History Chief Complaint  Patient presents with  . Chest Pain    Christine [name] is a 45 y.o. female.  45 yo F with a chief complaints of chest discomfort and shortness of breath.  This been going on for about a week now.  She feels slightly uncomfortable to the substernal region.  Tends to get a little bit better with deep breathing.  No exertional symptoms.  Denies cough congestion or fever.  She thinks the symptoms are due to an allergic reaction to a place that she stayed in [facility].  Felt like the place she stated was musty.  Saw her allergist today who ordered a D-dimer which came back positive and so she was sent here for a CT angiogram of the chest.  She denies history of PE or DVT denies hemoptysis denies unilateral lower extremity edema denies recent surgery immobilization or hospitalization denies estrogen use denies history of cancer.  The history is provided by the patient.  Chest Pain Pain location:  Epigastric Pain quality: aching   Pain radiates to:  Does not radiate Pain severity:  Moderate Onset quality:  Gradual Duration:  1 week Timing:  Constant Progression:  Worsening Chronicity:  New Relieved by:  Nothing Worsened by:  Nothing Ineffective treatments:  None tried Associated symptoms: shortness of breath   Associated symptoms: no dizziness, no fever, no headache, no nausea, no palpitations and no vomiting        Past Medical History:  Diagnosis Date  . Asthma   . GAD (generalized anxiety disorder)   . Vitamin D deficiency     Patient Active Problem List   Diagnosis Date Noted  . Weight gain due to medication 09/13/2020  . Insomnia 04/27/2020  . Gastroesophageal reflux disease 09/13/2018  . TMJ pain dysfunction syndrome 09/25/2017  . Allergic rhinitis 02/18/2016  . White coat syndrome with high blood pressure without hypertension  09/06/2015  . BMI 32.0-32.9,adult 09/06/2015  . Vulvitis 11/24/2014  . Mammographic breast lesion 11/24/2014  . Mild persistent asthma, uncomplicated 02/10/2013  . Generalized anxiety disorder 02/10/2013  . Vitamin D deficiency     Past Surgical History:  Procedure Laterality Date  . BREAST BIOPSY Right 07/2015   fibrocystic changes  . CESAREAN SECTION    . TONSILLECTOMY AND ADENOIDECTOMY       OB History   No obstetric history on file.     Family History  Problem Relation Age of Onset  . Diabetes Mother   . Hypertension Mother   . Hyperlipidemia Father   . Hypertension Father   . Allergic rhinitis Neg Hx   . Angioedema Neg Hx   . Asthma Neg Hx   . Eczema Neg Hx   . Immunodeficiency Neg Hx   . Urticaria Neg Hx     Social History   Tobacco Use  . Smoking status: Never Smoker  . Smokeless tobacco: Never Used  Vaping Use  . Vaping Use: Never used  Substance Use Topics  . Alcohol use: Yes  . Drug use: No    Home Medications Prior to Admission medications   Medication Sig Start Date End Date Taking? Authorizing Provider  albuterol (PROVENTIL) (2.5 MG/3ML) 0.083% nebulizer solution Take 3 mLs (2.5 mg total) by nebulization every 4 (four) hours as needed for wheezing or shortness of breath. 03/07/21   Alfonse Spruce, MD  ALPRAZolam Prudy Feeler)  0.25 MG tablet TAKE 1 TABLET BY MOUTH TWICE A DAY AS NEEDED (NEEDS TO BE SEEN) 12/03/20   Daphine DeutscherMartin, Mary-Margaret, FNP  Cholecalciferol (VITAMIN D3) 1000 units CAPS Take by mouth.    [provider]  ethynodiol-ethinyl estradiol [place](KELNOR 1/35) 1-35 MG-MCG tablet Take 1 tablet by mouth daily. 12/06/20   Daphine DeutscherMartin, Mary-Margaret, FNP  famotidine (PEPCID) 40 MG tablet Take 1 tablet (40 mg total) by mouth in the morning and at bedtime. 03/07/21   Alfonse SpruceGallagher, Joel Louis, MD  fluticasone (FLOVENT HFA) 110 MCG/ACT inhaler Inhale 2 puffs into the lungs 2 (two) times daily. Patient not taking: Reported on 03/07/2021 06/23/17   Bobbitt,  Heywood Ilesalph Carter, MD  Fluticasone Propionate Timmothy Sours(XHANCE) 93 MCG/ACT EXHU Place 2 sprays into the nose 2 (two) times daily. 11/08/19   Alfonse SpruceGallagher, Joel Louis, MD  montelukast (SINGULAIR) 10 MG tablet Take 1 tablet (10 mg total) by mouth at bedtime. 12/03/20   Daphine DeutscherMartin, Mary-Margaret, FNP  pantoprazole (PROTONIX) 40 MG tablet Take 1 tablet (40 mg total) by mouth daily. (Needs to be seen before next refill) 12/03/20   Bennie PieriniMartin, Mary-Margaret, FNP  sertraline (ZOLOFT) 100 MG tablet Take 1 tablet (100 mg total) by mouth daily. 12/03/20   Daphine DeutscherMartin, Mary-Margaret, FNP  Tiotropium Bromide Monohydrate (SPIRIVA RESPIMAT) 1.25 MCG/ACT AERS Inhale 1 puff into the lungs daily. Patient taking differently: Inhale 2 puffs into the lungs at bedtime. 12/03/20   Daphine DeutscherMartin, Mary-Margaret, FNP  Tiotropium Bromide-Olodaterol (STIOLTO RESPIMAT) 2.5-2.5 MCG/ACT AERS Inhale 2 puffs into the lungs in the morning and at bedtime. 03/07/21   Alfonse SpruceGallagher, Joel Louis, MD  UNABLE TO FIND  09/06/19   [provider]  VENTOLIN HFA 108 (90 Base) MCG/ACT inhaler TAKE 2 PUFFS BY MOUTH EVERY 6 HOURS AS NEEDED FOR WHEEZE OR SHORTNESS OF BREATH 11/15/19   Ambs, Norvel RichardsAnne M, FNP  zolpidem (AMBIEN) 10 MG tablet TAKE 1 TABLET BY MOUTH AT BEDTIME AS NEEDED FOR SLEEP. 01/28/21   Daphine DeutscherMartin, Mary-Margaret, FNP    Allergies    Sulfa antibiotics, Sulfasalazine, Doxycycline hyclate, Ivp dye [iodinated diagnostic agents], Other, Paxil [paroxetine hcl], and Phosphorus  Review of Systems   Review of Systems  Constitutional: Negative for chills and fever.  HENT: Negative for congestion and rhinorrhea.   Eyes: Negative for redness and visual disturbance.  Respiratory: Positive for shortness of breath. Negative for wheezing.   Cardiovascular: Positive for chest pain. Negative for palpitations.  Gastrointestinal: Negative for nausea and vomiting.  Genitourinary: Negative for dysuria and urgency.  Musculoskeletal: Negative for arthralgias and myalgias.  Skin: Negative for  pallor and wound.  Neurological: Negative for dizziness and headaches.    Physical Exam Updated Vital Signs BP (!) 156/92   Pulse 72   Temp 98 F (36.7 C) (Oral)   Resp 18   Ht 5\' 4"  (1.626 m)   Wt 82.1 kg   LMP 03/06/2021   SpO2 99%   BMI 31.07 kg/m   Physical Exam Vitals and nursing note reviewed.  Constitutional:      General: She is not in acute distress.    Appearance: She is well-developed. She is not diaphoretic.  HENT:     Head: Normocephalic and atraumatic.  Eyes:     Pupils: Pupils are equal, round, and reactive to light.  Cardiovascular:     Rate and Rhythm: Normal rate and regular rhythm.     Heart sounds: No murmur heard. No friction rub. No gallop.   Pulmonary:     Effort: Pulmonary effort is normal.  Breath sounds: No wheezing, rhonchi or rales.  Abdominal:     General: There is no distension.     Palpations: Abdomen is soft.     Tenderness: There is no abdominal tenderness.  Musculoskeletal:        General: No tenderness.     Cervical back: Normal range of motion and neck supple.  Skin:    General: Skin is warm and dry.  Neurological:     Mental Status: She is alert and oriented to person, place, and time.  Psychiatric:        Behavior: Behavior normal.     ED Results / Procedures / Treatments   Labs (all labs ordered are listed, but only abnormal results are displayed) Labs Reviewed  BASIC METABOLIC PANEL - Abnormal; Notable for the following components:      Result Value   Sodium 133 (*)    Potassium 3.4 (*)    All other components within normal limits  CBC  PREGNANCY, URINE  TROPONIN I (HIGH SENSITIVITY)  TROPONIN I (HIGH SENSITIVITY)    EKG EKG Interpretation  Date/Time:  Thursday March 07 2021 17:46:03 EDT Ventricular Rate:  77 PR Interval:  164 QRS Duration: 90 QT Interval:  412 QTC Calculation: 466 R Axis:   76 Text Interpretation: Normal sinus rhythm Normal ECG No old tracing to compare Confirmed by Melene Plan  [phone]) on 03/07/2021 7:01:59 PM   Radiology DG Chest 2 View  Result Date: 03/07/2021 CLINICAL DATA:  One-week history of chest tightness and shortness of breath. Current history of asthma. EXAM: CHEST - 2 VIEW COMPARISON:  None. FINDINGS: Cardiomediastinal silhouette unremarkable. Lungs clear. Bronchovascular markings normal. Pulmonary vascularity normal. No pneumothorax. No pleural effusions. Visualized bony thorax intact. IMPRESSION: Normal examination. Electronically Signed   By: Hulan Saas M.D.   On: 03/07/2021 18:23    Procedures Procedures   Medications Ordered in ED Medications  diphenhydrAMINE (BENADRYL) capsule 50 mg ( Oral See Alternative 03/07/21 2312)    Or  diphenhydrAMINE (BENADRYL) injection 50 mg (50 mg Intravenous Given 03/07/21 2312)  hydrocortisone sodium succinate (SOLU-CORTEF) 100 MG injection 200 mg (200 mg Intravenous Given 03/07/21 2032)  alum & mag hydroxide-simeth (MAALOX/MYLANTA) 200-200-20 MG/5ML suspension 30 mL (30 mLs Oral Given 03/07/21 2033)    ED Course  I have reviewed the triage vital signs and the nursing notes.  Pertinent labs & imaging results that were available during my care of the patient were reviewed by me and considered in my medical decision making (see chart for details).    MDM Rules/Calculators/A&P                          45 yo F with a chief complaints of chest pain and shortness of breath.  Going on for the past week.  Patient is EKG is unremarkable chest x-ray viewed by me without focal infiltrate troponin is completely negative.  The patient is PERC negative but had a D-dimer performed as an outpatient that was positive.  Will obtain a CT angiogram of the chest.  Unfortunately she also has a contrast dye allergy and so we will start the emergent protocol.   Signed out to Dr. Eudelia Bunch, please see their note for further details of care.   The patients results and plan were reviewed and discussed.   Any x-rays performed were  independently reviewed by myself.   Differential diagnosis were considered with the presenting HPI.  Medications  diphenhydrAMINE (BENADRYL) capsule  50 mg ( Oral See Alternative 03/07/21 2312)    Or  diphenhydrAMINE (BENADRYL) injection 50 mg (50 mg Intravenous Given 03/07/21 2312)  hydrocortisone sodium succinate (SOLU-CORTEF) 100 MG injection 200 mg (200 mg Intravenous Given 03/07/21 2032)  alum & mag hydroxide-simeth (MAALOX/MYLANTA) 200-200-20 MG/5ML suspension 30 mL (30 mLs Oral Given 03/07/21 2033)    Vitals:   03/07/21 2200 03/07/21 2230 03/07/21 2300 03/07/21 2315  BP: (!) 153/84 (!) 154/94 (!) 146/84 (!) 156/92  Pulse: 73 75 70 72  Resp:   18 18  Temp:      TempSrc:      SpO2: 100% 98% 97% 99%  Weight:      Height:        Final diagnoses:  Chest pain, unspecified type       Final Clinical Impression(s) / ED Diagnoses Final diagnoses:  Chest pain, unspecified type    Rx / DC Orders ED Discharge Orders    None       Melene Plan, DO 03/07/21 2319

## 2021-03-07 NOTE — ED Triage Notes (Signed)
Chest tightness for 1 week. Elevated D dimer at dr office today.

## 2021-03-07 NOTE — Patient Instructions (Addendum)
1. Moderate persistent asthma, uncomplicated - with history of thrush - Lung testing looked great today. - We are going to get a D-dimer to rule out a pulmonary embolism.  - I have a relatively low inclination to think that this is what is going on. - We are going to stop Spiriva for now and start Stiolto two puffs twice daily (samples provided).  - Start the prednisone pack if you are not feeling better in a couple of days. - Daily controller medication(s): Singulair 10mg  daily and Stiolto two puffs once daily - Prior to physical activity: albuterol 2 puffs 10-15 minutes before physical activity. - Rescue medications: albuterol 4 puffs every 4-6 hours as needed or albuterol nebulizer one vial every 4-6 hours as needed - Asthma control goals:  * Full participation in all desired activities (may need albuterol before activity) * Albuterol use two time or less a week on average (not counting use with activity) * Cough interfering with sleep two time or less a month * Oral steroids no more than once a year * No hospitalizations  2. Reflux - Continue with Protonix 40 mg once a day for reflux. - Continue with Tums as needed.  - Add on Pepcid 20mg  twice daily for TWO WEEKS to see if this helps with your SOB.   3. Seasonal and perennial allergic rhinitis - Continue with nasal saline rinses - Continue with Xhance two sprays per nostril daily. - Continue the cetirizine 10mg  daily.   4. Alpha gal allergy - is up to date.  - Continue to avoid red meats.   5. Return in about 6 months (around 09/06/2021).    Please inform of any Emergency Department visits, hospitalizations, or changes in symptoms. Call Audry Riles before going to the ED for breathing or allergy symptoms since we might be able to fit you in for a sick visit. Feel free to contact 09/08/2021 anytime with any questions, problems, or concerns.  It was a pleasure to see you again today!  Websites that have reliable patient  information: 1. American Academy of Asthma, Allergy, and Immunology: www.aaaai.org 2. Food Allergy Research and Education (FARE): foodallergy.org 3. Mothers of Asthmatics: http://www.asthmacommunitynetwork.org 4. American College of Allergy, Asthma, and Immunology: www.acaai.org   COVID-19 Vaccine Information can be found at: [place] For questions related to vaccine distribution or appointments, please email [email or web address] or call [phone].   We realize that you might be concerned about having an allergic reaction to the COVID19 vaccines. To help with that concern, WE ARE OFFERING THE COVID19 VACCINES IN OUR OFFICE! Ask the front desk for dates!     "Like" [place] on Facebook and Instagram for our latest updates!      A healthy democracy works best when [email or web address] participate! Make sure you are registered to vote! If you have moved or changed any of your contact information, you will need to get this updated before voting!  In some cases, you MAY be able to register to vote online: [phone]

## 2021-03-08 ENCOUNTER — Emergency Department (HOSPITAL_BASED_OUTPATIENT_CLINIC_OR_DEPARTMENT_OTHER): Payer: BC Managed Care – PPO

## 2021-03-08 ENCOUNTER — Encounter (HOSPITAL_BASED_OUTPATIENT_CLINIC_OR_DEPARTMENT_OTHER): Payer: Self-pay | Admitting: Radiology

## 2021-03-08 DIAGNOSIS — R0602 Shortness of breath: Secondary | ICD-10-CM | POA: Diagnosis not present

## 2021-03-08 MED ORDER — IOHEXOL 350 MG/ML SOLN
100.0000 mL | Freq: Once | INTRAVENOUS | Status: AC | PRN
  Administered 2021-03-08: 100 mL via INTRAVENOUS

## 2021-03-08 NOTE — ED Provider Notes (Signed)
CTA negative. HDS. No distress.  The patient appears reasonably screened and/or stabilized for discharge and I doubt any other medical condition or other [facility] requiring further screening, evaluation, or treatment in the ED at this time prior to discharge. Safe for discharge with strict return precautions.  Disposition: Discharge  Condition: Good  I have discussed the results, Dx and Tx plan with the patient/family who expressed understanding and agree(s) with the plan. Discharge instructions discussed at length. The patient/family was given strict return precautions who verbalized understanding of the instructions. No further questions at time of discharge.    ED Discharge Orders    None       Follow Up: Bennie Pierini, FNP [address] [place] [address] [phone]  Call  to schedule an appointment for close follow up      Laird Runnion, Amadeo Garnet, MD 03/08/21 [phone]

## 2021-03-14 ENCOUNTER — Ambulatory Visit: Payer: BC Managed Care – PPO | Admitting: Nurse Practitioner

## 2021-04-07 ENCOUNTER — Other Ambulatory Visit: Payer: Self-pay | Admitting: Nurse Practitioner

## 2021-04-07 DIAGNOSIS — F5101 Primary insomnia: Secondary | ICD-10-CM

## 2021-05-08 ENCOUNTER — Other Ambulatory Visit: Payer: Self-pay | Admitting: Nurse Practitioner

## 2021-05-08 DIAGNOSIS — J4530 Mild persistent asthma, uncomplicated: Secondary | ICD-10-CM

## 2021-05-09 ENCOUNTER — Other Ambulatory Visit: Payer: Self-pay | Admitting: *Deleted

## 2021-05-09 DIAGNOSIS — K219 Gastro-esophageal reflux disease without esophagitis: Secondary | ICD-10-CM

## 2021-05-09 DIAGNOSIS — F411 Generalized anxiety disorder: Secondary | ICD-10-CM

## 2021-05-09 MED ORDER — PANTOPRAZOLE SODIUM 40 MG PO TBEC: 40.0000 mg | DELAYED_RELEASE_TABLET | Freq: Every day | ORAL | 1 refills | Status: DC

## 2021-05-09 MED ORDER — VENTOLIN HFA 108 (90 BASE) MCG/ACT IN AERS: INHALATION_SPRAY | RESPIRATORY_TRACT | 2 refills | Status: DC

## 2021-05-09 MED ORDER — SERTRALINE HCL 100 MG PO TABS: 100.0000 mg | ORAL_TABLET | Freq: Every day | ORAL | 0 refills | Status: DC

## 2021-05-09 NOTE — Addendum Note (Signed)
Addended by: Magdalene River on: 05/09/2021 09:29 AM   Modules accepted: Orders

## 2021-05-09 NOTE — Telephone Encounter (Signed)
MMM Needs July 6 mos appt RF sent to pharmacy

## 2021-06-04 ENCOUNTER — Other Ambulatory Visit: Payer: Self-pay | Admitting: Nurse Practitioner

## 2021-06-04 DIAGNOSIS — F5101 Primary insomnia: Secondary | ICD-10-CM

## 2021-06-04 DIAGNOSIS — F411 Generalized anxiety disorder: Secondary | ICD-10-CM

## 2021-06-05 ENCOUNTER — Telehealth: Payer: Self-pay | Admitting: Nurse Practitioner

## 2021-06-05 NOTE — Telephone Encounter (Signed)
  Prescription Request  06/05/2021  What is the name of the medication or equipment? Ambien  Have you contacted your pharmacy to request a refill? (if applicable) Yes  Which pharmacy would you like this sent to?  [facility]  Pt scheduled an appt to see MMM on 06/13/21 for med refill but needs refill called in to last her until her appt.

## 2021-06-05 NOTE — Telephone Encounter (Signed)
PATIENT AWARE

## 2021-06-05 NOTE — Telephone Encounter (Signed)
Has to be seen at least every 6 months for control meds. Office policy

## 2021-06-05 NOTE — Telephone Encounter (Signed)
Had a cpe in January. Please advise on refill

## 2021-06-13 ENCOUNTER — Encounter: Payer: Self-pay | Admitting: Nurse Practitioner

## 2021-06-13 ENCOUNTER — Ambulatory Visit: Payer: BC Managed Care – PPO | Admitting: Nurse Practitioner

## 2021-06-13 ENCOUNTER — Other Ambulatory Visit: Payer: Self-pay

## 2021-06-13 VITALS — BP 150/96 | HR 77 | Temp 97.5°F | Resp 20 | Ht 64.0 in | Wt 173.0 lb

## 2021-06-13 DIAGNOSIS — Z6829 Body mass index (BMI) 29.0-29.9, adult: Secondary | ICD-10-CM

## 2021-06-13 DIAGNOSIS — F411 Generalized anxiety disorder: Secondary | ICD-10-CM | POA: Diagnosis not present

## 2021-06-13 DIAGNOSIS — K219 Gastro-esophageal reflux disease without esophagitis: Secondary | ICD-10-CM | POA: Diagnosis not present

## 2021-06-13 DIAGNOSIS — I10 Essential (primary) hypertension: Secondary | ICD-10-CM

## 2021-06-13 DIAGNOSIS — Z6832 Body mass index (BMI) 32.0-32.9, adult: Secondary | ICD-10-CM | POA: Diagnosis not present

## 2021-06-13 DIAGNOSIS — J4530 Mild persistent asthma, uncomplicated: Secondary | ICD-10-CM

## 2021-06-13 DIAGNOSIS — F5101 Primary insomnia: Secondary | ICD-10-CM

## 2021-06-13 MED ORDER — HYDROCHLOROTHIAZIDE 12.5 MG PO CAPS: 12.5000 mg | ORAL_CAPSULE | Freq: Every day | ORAL | 1 refills | Status: DC

## 2021-06-13 MED ORDER — ZOLPIDEM TARTRATE 10 MG PO TABS: 10.0000 mg | ORAL_TABLET | Freq: Every day | ORAL | 5 refills | Status: DC

## 2021-06-13 MED ORDER — SERTRALINE HCL 100 MG PO TABS: 100.0000 mg | ORAL_TABLET | Freq: Every day | ORAL | 0 refills | Status: DC

## 2021-06-13 NOTE — Addendum Note (Signed)
Addended by: Bennie Pierini on: 06/13/2021 12:35 PM   Modules accepted: Orders

## 2021-06-13 NOTE — Patient Instructions (Signed)
https://www.nhlbi.nih.gov/files/docs/public/heart/dash_brief.pdf">  DASH Eating Plan DASH stands for Dietary Approaches to Stop Hypertension. The DASH eating plan is a healthy eating plan that has been shown to: Reduce high blood pressure (hypertension). Reduce your risk for type 2 diabetes, heart disease, and stroke. Help with weight loss. What are tips for following this plan? Reading food labels Check food labels for the amount of salt (sodium) per serving. Choose foods with less than 5 percent of the Daily Value of sodium. Generally, foods with less than 300 milligrams (mg) of sodium per serving fit into this eating plan. To find whole grains, look for the word "whole" as the first word in the ingredient list. Shopping Buy products labeled as "low-sodium" or "no salt added." Buy fresh foods. Avoid canned foods and pre-made or frozen meals. Cooking Avoid adding salt when cooking. Use salt-free seasonings or herbs instead of table salt or sea salt. Check with your health care provider or pharmacist before using salt substitutes. Do not fry foods. Cook foods using healthy methods such as baking, boiling, grilling, roasting, and broiling instead. Cook with heart-healthy oils, such as olive, canola, avocado, soybean, or sunflower oil. Meal planning  Eat a balanced diet that includes: 4 or more servings of fruits and 4 or more servings of vegetables each day. Try to fill one-half of your plate with fruits and vegetables. 6-8 servings of whole grains each day. Less than 6 oz (170 g) of lean meat, poultry, or fish each day. A 3-oz (85-g) serving of meat is about the same size as a deck of cards. One egg equals 1 oz (28 g). 2-3 servings of low-fat dairy each day. One serving is 1 cup (237 mL). 1 serving of nuts, seeds, or beans 5 times each week. 2-3 servings of heart-healthy fats. Healthy fats called omega-3 fatty acids are found in foods such as walnuts, flaxseeds, fortified milks, and eggs.  These fats are also found in cold-water fish, such as sardines, salmon, and mackerel. Limit how much you eat of: Canned or prepackaged foods. Food that is high in trans fat, such as some fried foods. Food that is high in saturated fat, such as fatty meat. Desserts and other sweets, sugary drinks, and other foods with added sugar. Full-fat dairy products. Do not salt foods before eating. Do not eat more than 4 egg yolks a week. Try to eat at least 2 vegetarian meals a week. Eat more home-cooked food and less restaurant, buffet, and fast food.  Lifestyle When eating at a restaurant, ask that your food be prepared with less salt or no salt, if possible. If you drink alcohol: Limit how much you use to: 0-1 drink a day for women who are not pregnant. 0-2 drinks a day for men. Be aware of how much alcohol is in your drink. In the [place] one drink equals one 12 oz bottle of beer (355 mL), one 5 oz glass of wine (148 mL), or one 1 oz glass of hard liquor (44 mL). General information Avoid eating more than 2,300 mg of salt a day. If you have hypertension, you may need to reduce your sodium intake to 1,500 mg a day. Work with your health care provider to maintain a healthy body weight or to lose weight. Ask what an ideal weight is for you. Get at least 30 minutes of exercise that causes your heart to beat faster (aerobic exercise) most days of the week. Activities may include walking, swimming, or biking. Work with your health care provider   or dietitian to adjust your eating plan to your individual calorie needs. What foods should I eat? Fruits All fresh, dried, or frozen fruit. Canned fruit in natural juice (without addedsugar). Vegetables Fresh or frozen vegetables (raw, steamed, roasted, or grilled). Low-sodium or reduced-sodium tomato and vegetable juice. Low-sodium or reduced-sodium tomatosauce and tomato paste. Low-sodium or reduced-sodium canned vegetables. Grains Whole-grain or  whole-wheat bread. Whole-grain or whole-wheat pasta. Brown rice. Oatmeal. Quinoa. Bulgur. Whole-grain and low-sodium cereals. Pita bread.Low-fat, low-sodium crackers. Whole-wheat flour tortillas. Meats and other proteins Skinless chicken or turkey. Ground chicken or turkey. Pork with fat trimmed off. Fish and seafood. Egg whites. Dried beans, peas, or lentils. Unsalted nuts, nut butters, and seeds. Unsalted canned beans. Lean cuts of beef with fat trimmed off. Low-sodium, lean precooked or cured meat, such as sausages or meatloaves. Dairy Low-fat (1%) or fat-free (skim) milk. Reduced-fat, low-fat, or fat-free cheeses. Nonfat, low-sodium ricotta or cottage cheese. Low-fat or nonfatyogurt. Low-fat, low-sodium cheese. Fats and oils Soft margarine without trans fats. Vegetable oil. Reduced-fat, low-fat, or light mayonnaise and salad dressings (reduced-sodium). Canola, safflower, olive, avocado, soybean, andsunflower oils. Avocado. Seasonings and condiments Herbs. Spices. Seasoning mixes without salt. Other foods Unsalted popcorn and pretzels. Fat-free sweets. The items listed above may not be a complete list of foods and beverages you can eat. Contact a dietitian for more information. What foods should I avoid? Fruits Canned fruit in a light or heavy syrup. Fried fruit. Fruit in cream or buttersauce. Vegetables Creamed or fried vegetables. Vegetables in a cheese sauce. Regular canned vegetables (not low-sodium or reduced-sodium). Regular canned tomato sauce and paste (not low-sodium or reduced-sodium). Regular tomato and vegetable juice(not low-sodium or reduced-sodium). Pickles. Olives. Grains Baked goods made with fat, such as croissants, muffins, or some breads. Drypasta or rice meal packs. Meats and other proteins Fatty cuts of meat. Ribs. Fried meat. Bacon. Bologna, salami, and other precooked or cured meats, such as sausages or meat loaves. Fat from the back of a pig (fatback). Bratwurst.  Salted nuts and seeds. Canned beans with added salt. Canned orsmoked fish. Whole eggs or egg yolks. Chicken or turkey with skin. Dairy Whole or 2% milk, cream, and half-and-half. Whole or full-fat cream cheese. Whole-fat or sweetened yogurt. Full-fat cheese. Nondairy creamers. Whippedtoppings. Processed cheese and cheese spreads. Fats and oils Butter. Stick margarine. Lard. Shortening. Ghee. Bacon fat. Tropical oils, suchas coconut, palm kernel, or palm oil. Seasonings and condiments Onion salt, garlic salt, seasoned salt, table salt, and sea salt. Worcestershire sauce. Tartar sauce. Barbecue sauce. Teriyaki sauce. Soy sauce, including reduced-sodium. Steak sauce. Canned and packaged gravies. Fish sauce. Oyster sauce. Cocktail sauce. Store-bought horseradish. Ketchup. Mustard. Meat flavorings and tenderizers. Bouillon cubes. Hot sauces. Pre-made or packaged marinades. Pre-made or packaged taco seasonings. Relishes. Regular saladdressings. Other foods Salted popcorn and pretzels. The items listed above may not be a complete list of foods and beverages you should avoid. Contact a dietitian for more information. Where to find more information National Heart, Lung, and Blood Institute: www.nhlbi.nih.gov American Heart Association: www.heart.org Academy of Nutrition and Dietetics: www.eatright.org National Kidney Foundation: www.kidney.org Summary The DASH eating plan is a healthy eating plan that has been shown to reduce high blood pressure (hypertension). It may also reduce your risk for type 2 diabetes, heart disease, and stroke. When on the DASH eating plan, aim to eat more fresh fruits and vegetables, whole grains, lean proteins, low-fat dairy, and heart-healthy fats. With the DASH eating plan, you should limit salt (sodium) intake to 2,300   mg a day. If you have hypertension, you may need to reduce your sodium intake to 1,500 mg a day. Work with your health care provider or dietitian to adjust  your eating plan to your individual calorie needs. This information is not intended to replace advice given to you by your health care provider. Make sure you discuss any questions you have with your healthcare provider. Document Revised: 10/14/2019 Document Reviewed: 10/14/2019 Elsevier Patient Education  2022 Elsevier Inc.  

## 2021-06-13 NOTE — Progress Notes (Signed)
Subjective:    Patient ID: Christine [name], female    DOB: 13-Aug-1976, 45 y.o.   MRN: [id number]   Chief Complaint: Medical Management of Chronic Issues    HPI:  1. Mild persistent asthma, uncomplicated States she is well controlled and does not have any complaints. Takes inhalers as prescribed. Has not used albuterol at all this week.   2. Gastroesophageal reflux disease, unspecified whether esophagitis present Takes medication as prescribed, denies issues when she is doing "what she is supposed to".   3. Generalized anxiety disorder GAD 7 : Generalized Anxiety Score 06/13/2021 12/03/2020 09/13/2020 05/01/2020  Nervous, Anxious, on Edge 0 3 0 0  Control/stop worrying 0 0 0 0  Worry too much - different things 0 0 0 0  Trouble relaxing 0 1 0 0  Restless 1 1 0 0  Easily annoyed or irritable 1 1 0 0  Afraid - awful might happen 0 1 0 0  Total GAD 7 Score 2 7 0 0  Anxiety Difficulty Somewhat difficult Not difficult at all Not difficult at all Not difficult at all   Takes medication as prescribed. Denies any new or worsening symptoms, fells well controlled.    4. BMI 32.0-32.9,adult Wt Readings from Last 3 Encounters:  06/13/21 173 lb (78.5 kg)  03/07/21 181 lb (82.1 kg)  12/03/20 186 lb (84.4 kg)   BMI Readings from Last 3 Encounters:  06/13/21 29.70 kg/m  03/07/21 31.07 kg/m  12/03/20 31.93 kg/m   Walking ~5 times a week. States she has cleaned up her diet. Avoids fried foods now and is feeling good.     Outpatient Encounter Medications as of 06/13/2021  Medication Sig   albuterol (PROVENTIL) (2.5 MG/3ML) 0.083% nebulizer solution Take 3 mLs (2.5 mg total) by nebulization every 4 (four) hours as needed for wheezing or shortness of breath.   ALPRAZolam (XANAX) 0.25 MG tablet TAKE 1 TABLET BY MOUTH TWICE A DAY AS NEEDED (NEEDS TO BE SEEN)   Cholecalciferol (VITAMIN D3) 1000 units CAPS Take by mouth.   ethynodiol-ethinyl estradiol (KELNOR 1/35) 1-35 MG-MCG tablet Take 1  tablet by mouth daily.   famotidine (PEPCID) 40 MG tablet Take 1 tablet (40 mg total) by mouth in the morning and at bedtime.   fluticasone (FLOVENT HFA) 110 MCG/ACT inhaler Inhale 2 puffs into the lungs 2 (two) times daily.   Fluticasone Propionate (XHANCE) 93 MCG/ACT EXHU Place 2 sprays into the nose 2 (two) times daily.   montelukast (SINGULAIR) 10 MG tablet TAKE 1 TABLET BY MOUTH EVERYDAY AT BEDTIME   pantoprazole (PROTONIX) 40 MG tablet Take 1 tablet (40 mg total) by mouth daily. (Needs to be seen before next refill)   sertraline (ZOLOFT) 100 MG tablet Take 1 tablet (100 mg total) by mouth daily.   Tiotropium Bromide Monohydrate (SPIRIVA RESPIMAT) 1.25 MCG/ACT AERS Inhale 1 puff into the lungs daily. (Patient taking differently: Inhale 2 puffs into the lungs at bedtime.)   Tiotropium Bromide-Olodaterol (STIOLTO RESPIMAT) 2.5-2.5 MCG/ACT AERS Inhale 2 puffs into the lungs in the morning and at bedtime.   UNABLE TO FIND    VENTOLIN HFA 108 (90 Base) MCG/ACT inhaler TAKE 2 PUFFS BY MOUTH EVERY 6 HOURS AS NEEDED FOR WHEEZE OR SHORTNESS OF BREATH   zolpidem (AMBIEN) 10 MG tablet TAKE 1 TABLET BY MOUTH EVERY DAY AT BEDTIME AS NEEDED FOR SLEEP   No facility-administered encounter medications on file as of 06/13/2021.    Past Surgical History:  Procedure Laterality Date  BREAST BIOPSY Right 07/2015   fibrocystic changes   CESAREAN SECTION     TONSILLECTOMY AND ADENOIDECTOMY      Family History  Problem Relation Age of Onset   Diabetes Mother    Hypertension Mother    Hyperlipidemia Father    Hypertension Father    Allergic rhinitis Neg Hx    Angioedema Neg Hx    Asthma Neg Hx    Eczema Neg Hx    Immunodeficiency Neg Hx    Urticaria Neg Hx     New complaints: States she is concerned about increase in BP, which she checks sometimes at home. ~130-140s at home. States she has been eating healthy, avoiding salt, and exercising regularly. Has lost 13 lbs since last visit in January.  Would like to begin BP medication. Denies swelling, cheat pain, or SOB.  BP Readings from Last 3 Encounters:  06/13/21 (!) 150/96  03/08/21 (!) 141/88  03/07/21 (!) 146/88     Social history: Lives at home with husband, pool, goes out with friends. Son is going to boot camp ~3 weeks (AF), has [employer] later this summer.   Controlled substance contract: signed today   Review of Systems  Constitutional: Negative.   HENT: Negative.    Eyes: Negative.   Respiratory: Negative.    Cardiovascular: Negative.   Gastrointestinal: Negative.   Endocrine: Negative.   Genitourinary: Negative.   Musculoskeletal: Negative.   Skin: Negative.   Allergic/Immunologic: Negative.   Neurological: Negative.   Hematological: Negative.   Psychiatric/Behavioral: Negative.    All other systems reviewed and are negative.     Objective:   Physical Exam Vitals and nursing note reviewed.  Constitutional:      Appearance: Normal appearance.  HENT:     Head: Normocephalic and atraumatic.     Right Ear: Tympanic membrane normal.     Left Ear: Tympanic membrane normal.     Nose: Nose normal.     Mouth/Throat:     Mouth: Mucous membranes are moist.     Pharynx: Oropharynx is clear.  Eyes:     Conjunctiva/sclera: Conjunctivae normal.     Pupils: Pupils are equal, round, and reactive to light.  Cardiovascular:     Rate and Rhythm: Normal rate and regular rhythm.     Pulses: Normal pulses.     Heart sounds: Normal heart sounds.  Pulmonary:     Effort: Pulmonary effort is normal.     Breath sounds: Normal breath sounds.  Abdominal:     General: Abdomen is flat.     Palpations: Abdomen is soft.  Musculoskeletal:        General: Normal range of motion.     Cervical back: Normal range of motion.  Skin:    General: Skin is warm and dry.     Capillary Refill: Capillary refill takes less than 2 seconds.  Neurological:     General: No focal deficit present.     Mental Status: She is alert  and oriented to person, place, and time. Mental status is at baseline.  Psychiatric:        Mood and Affect: Mood normal.        Behavior: Behavior normal.        Thought Content: Thought content normal.        Judgment: Judgment normal.    BP (!) 150/96   Pulse 77   Temp (!) 97.5 F (36.4 C) (Temporal)   Resp 20   Ht 5\' 4"  (  1.626 m)   Wt 173 lb (78.5 kg)   SpO2 98%   BMI 29.70 kg/m      Assessment & Plan:  Christine [name] comes in today with chief complaint of Medical Management of Chronic Issues (Discuss bp meds)   Diagnosis and orders addressed:  1. Mild persistent asthma, uncomplicated Take medication as prescribed. Avoids triggers. Report any new or worsening issues.   2. Gastroesophageal reflux disease, unspecified whether esophagitis present Take medication as prescribed. Avoid trigger foods such as spicy or fried foods.   3. Generalized anxiety disorder Take medication as prescribed. Practice stress management/reduction. Report any new or worsening symptoms.   4. BMI 29.0-29.9,adult Exercise regularly. Regular exercise helps you to maintain a healthy body weight, improves mood, and improves overall health.    5. hypertension primary Added HCTZ to meds Low sodium diet  Labs pending Health Maintenance reviewed Diet and exercise encouraged  Follow up plan: Follow up in 6 months.   Mary-Margaret Daphine Deutscher, FNP

## 2021-06-14 LAB — CBC WITH DIFFERENTIAL/PLATELET
Basophils Absolute: 0.1 10*3/uL (ref 0.0–0.2)
Basos: 1 %
EOS (ABSOLUTE): 0.1 10*3/uL (ref 0.0–0.4)
Eos: 1 %
Hematocrit: 41 % (ref 34.0–46.6)
Hemoglobin: 13.2 g/dL (ref 11.1–15.9)
Immature Grans (Abs): 0 10*3/uL (ref 0.0–0.1)
Immature Granulocytes: 0 %
Lymphocytes Absolute: 2.5 10*3/uL (ref 0.7–3.1)
Lymphs: 36 %
MCH: 28.2 pg (ref 26.6–33.0)
MCHC: 32.2 g/dL (ref 31.5–35.7)
MCV: 88 fL (ref 79–97)
Monocytes Absolute: 0.4 10*3/uL (ref 0.1–0.9)
Monocytes: 6 %
Neutrophils Absolute: 3.9 10*3/uL (ref 1.4–7.0)
Neutrophils: 56 %
Platelets: 255 10*3/uL (ref 150–450)
RBC: 4.68 x10E6/uL (ref 3.77–5.28)
RDW: 14.4 % (ref 11.7–15.4)
WBC: 7 10*3/uL (ref 3.4–10.8)

## 2021-06-14 LAB — LIPID PANEL
Chol/HDL Ratio: 4.5 ratio — ABNORMAL HIGH (ref 0.0–4.4)
Cholesterol, Total: 261 mg/dL — ABNORMAL HIGH (ref 100–199)
HDL: 58 mg/dL (ref >39)
LDL Chol Calc (NIH): 180 mg/dL — ABNORMAL HIGH (ref 0–99)
Triglycerides: 126 mg/dL (ref 0–149)
VLDL Cholesterol Cal: 23 mg/dL (ref 5–40)

## 2021-06-14 LAB — CMP14+EGFR
ALT: 17 IU/L (ref 0–32)
AST: 15 IU/L (ref 0–40)
Albumin/Globulin Ratio: 1.5 (ref 1.2–2.2)
Albumin: 4.2 g/dL (ref 3.8–4.8)
Alkaline Phosphatase: 77 IU/L (ref 44–121)
BUN/Creatinine Ratio: 10 (ref 9–23)
BUN: 8 mg/dL (ref 6–24)
Bilirubin Total: <0.2 mg/dL (ref 0.0–1.2)
CO2: 22 mmol/L (ref 20–29)
Calcium: 9.6 mg/dL (ref 8.7–10.2)
Chloride: 103 mmol/L (ref 96–106)
Creatinine, Ser: 0.79 mg/dL (ref 0.57–1.00)
Globulin, Total: 2.8 g/dL (ref 1.5–4.5)
Glucose: 86 mg/dL (ref 65–99)
Potassium: 4.7 mmol/L (ref 3.5–5.2)
Sodium: 140 mmol/L (ref 134–144)
Total Protein: 7 g/dL (ref 6.0–8.5)
eGFR: 93 mL/min/{1.73_m2} (ref >59)

## 2021-07-16 ENCOUNTER — Other Ambulatory Visit: Payer: Self-pay | Admitting: Nurse Practitioner

## 2021-07-16 DIAGNOSIS — J4530 Mild persistent asthma, uncomplicated: Secondary | ICD-10-CM

## 2021-07-17 ENCOUNTER — Encounter: Payer: Self-pay | Admitting: Family Medicine

## 2021-07-17 ENCOUNTER — Ambulatory Visit: Payer: BC Managed Care – PPO | Admitting: Family Medicine

## 2021-07-17 DIAGNOSIS — J019 Acute sinusitis, unspecified: Secondary | ICD-10-CM

## 2021-07-17 MED ORDER — AMOXICILLIN-POT CLAVULANATE 875-125 MG PO TABS: 1.0000 | ORAL_TABLET | Freq: Two times a day (BID) | ORAL | 0 refills | Status: AC

## 2021-07-17 MED ORDER — PREDNISONE 20 MG PO TABS: 20.0000 mg | ORAL_TABLET | Freq: Every day | ORAL | 0 refills | Status: AC

## 2021-07-17 NOTE — Progress Notes (Signed)
   Virtual Visit  Note Due to COVID-19 pandemic this visit was conducted virtually. This visit type was conducted due to national recommendations for restrictions regarding the COVID-19 Pandemic (e.g. social distancing, sheltering in place) in an effort to limit this patient's exposure and mitigate transmission in our community. All issues noted in this document were discussed and addressed.  A physical exam was not performed with this format.  I connected with Christine [name] on 07/17/21 at 1024 by telephone and verified that I am speaking with the correct person using two identifiers. Christine [name] is currently located at her office and no one is currently with her during the visit. The provider, Gabriel Earing, FNP is located in their office at time of visit.  I discussed the limitations, risks, security and privacy concerns of performing an evaluation and management service by telephone and the availability of in person appointments. I also discussed with the patient that there may be a patient responsible charge related to this service. The patient expressed understanding and agreed to proceed.  CC: sinusitis  History and Present Illness:  HPI Christine [name] reports sinus pressure and congestion for 7 days. This started after doing some deep cleaning in her home. She also has had postnasal drip. This has caused her throat to be irritated and has caused some chest congestion and cough in the mornings. She also reports watery eyes. She denies chest pain, shortness of breath, wheezing, fever, chills, myalgias, ear pain, nausea, vomiting, or diarrhea. She reports a history of asthma and allergies. She has been using her inhalers as prescribed. She also has tried flonase, nasal saline, singular, benadryl allergy and sinus, dayquil, and sudafed with mild improvement. She reports a history of sinusitis once a year.     ROS As per HPI.   Observations/Objective: Alert and oriented x 3. Able to speak in  full sentences without difficulty.   Assessment and Plan: Christine [name] was seen today for sinusitis.  Diagnoses and all orders for this visit:  Acute non-recurrent sinusitis, unspecified location Prednisone burst as below. Augmentin ordered. Continue allergy and asthma medications. Discussed symptomatic care and return precautions.  -     predniSONE (DELTASONE) 20 MG tablet; Take 1 tablet (20 mg total) by mouth daily with breakfast for 5 days. -     amoxicillin-clavulanate (AUGMENTIN) 875-125 MG tablet; Take 1 tablet by mouth 2 (two) times daily for 7 days.    Follow Up Instructions: Return to office for new or worsening symptoms, or if symptoms persist.     I discussed the assessment and treatment plan with the patient. The patient was provided an opportunity to ask questions and all were answered. The patient agreed with the plan and demonstrated an understanding of the instructions.   The patient was advised to call back or seek an in-person evaluation if the symptoms worsen or if the condition fails to improve as anticipated.  The above assessment and management plan was discussed with the patient. The patient verbalized understanding of and has agreed to the management plan. Patient is aware to call the clinic if symptoms persist or worsen. Patient is aware when to return to the clinic for a follow-up visit. Patient educated on when it is appropriate to go to the emergency department.   Time call ended: 1039   I provided 14 minutes of  non face-to-face time during this encounter.    Gabriel Earing, FNP

## 2021-07-23 ENCOUNTER — Telehealth: Payer: Self-pay | Admitting: Allergy and Immunology

## 2021-07-23 NOTE — Telephone Encounter (Signed)
Patient called about a bill that she received for $24.27  back in 2020. #[phone] // [phone].

## 2021-07-25 ENCOUNTER — Other Ambulatory Visit: Payer: Self-pay | Admitting: Nurse Practitioner

## 2021-07-25 DIAGNOSIS — F411 Generalized anxiety disorder: Secondary | ICD-10-CM

## 2021-08-05 ENCOUNTER — Encounter: Payer: Self-pay | Admitting: Nurse Practitioner

## 2021-08-05 ENCOUNTER — Other Ambulatory Visit: Payer: Self-pay

## 2021-08-05 ENCOUNTER — Telehealth: Payer: Self-pay | Admitting: Nurse Practitioner

## 2021-08-05 ENCOUNTER — Ambulatory Visit: Payer: BC Managed Care – PPO | Admitting: Nurse Practitioner

## 2021-08-05 VITALS — BP 126/81 | HR 80 | Temp 97.3°F | Resp 20 | Ht 64.0 in | Wt 164.0 lb

## 2021-08-05 DIAGNOSIS — J4530 Mild persistent asthma, uncomplicated: Secondary | ICD-10-CM | POA: Diagnosis not present

## 2021-08-05 DIAGNOSIS — K219 Gastro-esophageal reflux disease without esophagitis: Secondary | ICD-10-CM | POA: Diagnosis not present

## 2021-08-05 DIAGNOSIS — I10 Essential (primary) hypertension: Secondary | ICD-10-CM

## 2021-08-05 DIAGNOSIS — Z6828 Body mass index (BMI) 28.0-28.9, adult: Secondary | ICD-10-CM

## 2021-08-05 DIAGNOSIS — E559 Vitamin D deficiency, unspecified: Secondary | ICD-10-CM

## 2021-08-05 DIAGNOSIS — F411 Generalized anxiety disorder: Secondary | ICD-10-CM

## 2021-08-05 DIAGNOSIS — K640 First degree hemorrhoids: Secondary | ICD-10-CM

## 2021-08-05 DIAGNOSIS — B373 Candidiasis of vulva and vagina: Secondary | ICD-10-CM

## 2021-08-05 DIAGNOSIS — F5101 Primary insomnia: Secondary | ICD-10-CM

## 2021-08-05 DIAGNOSIS — B3731 Acute candidiasis of vulva and vagina: Secondary | ICD-10-CM

## 2021-08-05 MED ORDER — FLUCONAZOLE 150 MG PO TABS: 150.0000 mg | ORAL_TABLET | Freq: Once | ORAL | 0 refills | Status: AC

## 2021-08-05 MED ORDER — HYDROCHLOROTHIAZIDE 12.5 MG PO CAPS: 12.5000 mg | ORAL_CAPSULE | Freq: Every day | ORAL | 1 refills | Status: DC

## 2021-08-05 MED ORDER — SPIRIVA RESPIMAT 1.25 MCG/ACT IN AERS: 1.0000 | INHALATION_SPRAY | Freq: Every day | RESPIRATORY_TRACT | 5 refills | Status: DC

## 2021-08-05 MED ORDER — SERTRALINE HCL 100 MG PO TABS: 100.0000 mg | ORAL_TABLET | Freq: Every day | ORAL | 0 refills | Status: DC

## 2021-08-05 MED ORDER — HYDROCORTISONE ACETATE 25 MG RE SUPP: 25.0000 mg | Freq: Two times a day (BID) | RECTAL | 0 refills | Status: DC

## 2021-08-05 MED ORDER — HYDROCORTISONE ACETATE 1 % EX OINT: TOPICAL_OINTMENT | CUTANEOUS | 1 refills | Status: DC

## 2021-08-05 MED ORDER — MONTELUKAST SODIUM 10 MG PO TABS: ORAL_TABLET | ORAL | 1 refills | Status: DC

## 2021-08-05 MED ORDER — ALPRAZOLAM 0.25 MG PO TABS: ORAL_TABLET | ORAL | 2 refills | Status: DC

## 2021-08-05 MED ORDER — PANTOPRAZOLE SODIUM 40 MG PO TBEC: 40.0000 mg | DELAYED_RELEASE_TABLET | Freq: Every day | ORAL | 1 refills | Status: DC

## 2021-08-05 NOTE — Telephone Encounter (Signed)
MMM was supposed to send in cream for external hemorrhoids. Sent to pharmacy and patient notified

## 2021-08-05 NOTE — Patient Instructions (Signed)
Stress, Adult Stress is a normal reaction to life events. Stress is what you feel when life demands more than you are used to, or more than you think you can handle. Some stress can be useful, such as studying for a test or meeting a deadline at work. Stress that occurs too often or for too long can cause problems. It can affect your emotional health and interfere with relationships and normal daily activities. Too much stress can weaken your body's defense system (immune system) and increase your risk for physical illness. If you already have a medical problem, stress can make it worse. What are the causes? All sorts of life events can cause stress. An event that causes stress for one person may not be stressful for another person. Major life events, whether positive or negative, commonly cause stress. Examples include: Losing a job or starting a new job. Losing a loved one. Moving to a new town or home. Getting married or divorced. Having a baby. Getting injured or sick. Less obvious life events can also cause stress, especially if they occur day after day or in combination with each other. Examples include: Working long hours. Driving in traffic. Caring for children. Being in debt. Being in a difficult relationship. What are the signs or symptoms? Stress can cause emotional symptoms, including: Anxiety. This is feeling worried, afraid, on edge, overwhelmed, or out of control. Anger, including irritation or impatience. Depression. This is feeling sad, down, helpless, or guilty. Trouble focusing, remembering, or making decisions. Stress can cause physical symptoms, including: Aches and pains. These may affect your head, neck, back, stomach, or other areas of your body. Tight muscles or a clenched jaw. Low energy. Trouble sleeping. Stress can cause unhealthy behaviors, including: Eating to feel better (overeating) or skipping meals. Working too much or putting off tasks. Smoking,  drinking alcohol, or using drugs to feel better. How is this diagnosed? Stress is diagnosed through an assessment by your health care provider. He or she may diagnose this condition based on: Your symptoms and any stressful life events. Your medical history. Tests to rule out other causes of your symptoms. Depending on your condition, your health care provider may refer you to a specialist for further evaluation. How is this treated? Stress management techniques are the recommended treatment for stress. Medicine is not typically recommended for the treatment of stress. Techniques to reduce your reaction to stressful life events include: Stress identification. Monitor yourself for symptoms of stress and identify what causes stress for you. These skills may help you to avoid or prepare for stressful events. Time management. Set your priorities, keep a calendar of events, and learn to say no. Taking these actions can help you avoid making too many commitments. Techniques for coping with stress include: Rethinking the problem. Try to think realistically about stressful events rather than ignoring them or overreacting. Try to find the positives in a stressful situation rather than focusing on the negatives. Exercise. Physical exercise can release both physical and emotional tension. The key is to find a form of exercise that you enjoy and do it regularly. Relaxation techniques. These relax the body and mind. The key is to find one or more that you enjoy and use the techniques regularly. Examples include: Meditation, deep breathing, or progressive relaxation techniques. Yoga or tai chi. Biofeedback, mindfulness techniques, or journaling. Listening to music, being out in nature, or participating in other hobbies. Practicing a healthy lifestyle. Eat a balanced diet, drink plenty of water, limit or  avoid caffeine, and get plenty of sleep. Having a strong support network. Spend time with family, friends,  or other people you enjoy being around. Express your feelings and talk things over with someone you trust. Counseling or talk therapy with a mental health professional may be helpful if you are having trouble managing stress on your own. Follow these instructions at home: Lifestyle  Avoid drugs. Do not use any products that contain nicotine or tobacco, such as cigarettes, e-cigarettes, and chewing tobacco. If you need help quitting, ask your health care provider. Limit alcohol intake to no more than 1 drink a day for nonpregnant women and 2 drinks a day for men. One drink equals 12 oz of beer, 5 oz of wine, or 1 oz of hard liquor Do not use alcohol or drugs to relax. Eat a balanced diet that includes fresh fruits and vegetables, whole grains, lean meats, fish, eggs, and beans, and low-fat dairy. Avoid processed foods and foods high in added fat, sugar, and salt. Exercise at least 30 minutes on 5 or more days each week. Get 7-8 hours of sleep each night. General instructions  Practice stress management techniques as discussed with your health care provider. Drink enough fluid to keep your urine clear or pale yellow. Take over-the-counter and prescription medicines only as told by your health care provider. Keep all follow-up visits as told by your health care provider. This is important. Contact a health care provider if: Your symptoms get worse. You have new symptoms. You feel overwhelmed by your problems and can no longer manage them on your own. Get help right away if: You have thoughts of hurting yourself or others. If you ever feel like you may hurt yourself or others, or have thoughts about taking your own life, get help right away. You can go to your nearest emergency department or call: Your local emergency services (911 in the [place] A suicide crisis helpline, such as the [facility] at (410)025-6148. This is open 24 hours a day. Summary Stress is a  normal reaction to life events. It can cause problems if it happens too often or for too long. Practicing stress management techniques is the best way to treat stress. Counseling or talk therapy with a mental health professional may be helpful if you are having trouble managing stress on your own. This information is not intended to replace advice given to you by your health care provider. Make sure you discuss any questions you have with your health care provider. Document Revised: 01/18/2021 Document Reviewed: 07/27/2020 Elsevier Patient Education  2022 [employer].

## 2021-08-05 NOTE — Progress Notes (Signed)
Subjective:    Patient ID: Christine [name], female    DOB: [birth year], 45 y.o.   MRN: [id number]  Chief Complaint: Medical Management of Chronic Issues and Hemorrhoids    HPI:  1. Primary hypertension Noi c/o chest pain, sob or headache. Does check blood pressure at home and is 110-120 systolic BP Readings from Last 3 Encounters:  08/05/21 126/81  06/13/21 (!) 150/96  03/08/21 (!) 141/88     2. Mild persistent asthma, uncomplicated Denies SOB or cough, is on singulair and spirivia. Doing well.  3. Gastroesophageal reflux disease, unspecified whether esophagitis present Is on protonix daily and is doing well.  4. Generalized anxiety disorder Has been very anxious because her son left for the [employer]. Her best friend has brain cancer and things have really been hard. Xanax has not been helping. Is on zoloft 100mg   GAD 7 : Generalized Anxiety Score 08/05/2021 06/13/2021 12/03/2020 09/13/2020  Nervous, Anxious, on Edge 3 0 3 0  Control/stop worrying 3 0 0 0  Worry too much - different things 3 0 0 0  Trouble relaxing 2 0 1 0  Restless 0 1 1 0  Easily annoyed or irritable 3 1 1  0  Afraid - awful might happen 2 0 1 0  Total GAD 7 Score 16 2 7  0  Anxiety Difficulty Somewhat difficult Somewhat difficult Not difficult at all Not difficult at all   Depression screen [facility] 2/9 08/05/2021 06/13/2021 12/03/2020 11/12/2020 09/13/2020  Decreased Interest 2 0 0 0 0  Down, Depressed, Hopeless 2 0 0 0 0  PHQ - 2 Score 4 0 0 0 0  Altered sleeping 2 1 0 - -  Tired, decreased energy 3 0 0 - -  Change in appetite 2 0 0 - -  Feeling bad or failure about yourself  2 0 0 - -  Trouble concentrating 1 0 0 - -  Moving slowly or fidgety/restless 0 0 0 - -  Suicidal thoughts 0 1 0 - -  PHQ-9 Score 14 2 0 - -  Difficult doing work/chores Somewhat difficult - Not difficult at all - -  Some recent data might be hidden      5. Primary insomnia Has had some trouble sleeping as of late. Is on ambien  nightly  6. Vitamin D deficiency I son vitamin d supplement  7. BMI 29.0-29.9,adult No recent weight changes Wt Readings from Last 3 Encounters:  08/05/21 164 lb (74.4 kg)  06/13/21 173 lb (78.5 kg)  03/07/21 181 lb (82.1 kg)   BMI Readings from Last 3 Encounters:  08/05/21 28.15 kg/m  06/13/21 29.70 kg/m  03/07/21 31.07 kg/m       Outpatient Encounter Medications as of 08/05/2021  Medication Sig   albuterol (PROVENTIL) (2.5 MG/3ML) 0.083% nebulizer solution Take 3 mLs (2.5 mg total) by nebulization every 4 (four) hours as needed for wheezing or shortness of breath.   ALPRAZolam (XANAX) 0.25 MG tablet TAKE 1 TABLET BY MOUTH TWICE A DAY AS NEEDED (NEEDS TO BE SEEN)   Cholecalciferol (VITAMIN D3) 1000 units CAPS Take by mouth.   ethynodiol-ethinyl estradiol (KELNOR 1/35) 1-35 MG-MCG tablet Take 1 tablet by mouth daily.   famotidine (PEPCID) 40 MG tablet Take 1 tablet (40 mg total) by mouth in the morning and at bedtime.   fluticasone (FLOVENT HFA) 110 MCG/ACT inhaler Inhale 2 puffs into the lungs 2 (two) times daily.   Fluticasone Propionate (XHANCE) 93 MCG/ACT EXHU Place 2 sprays into the nose  2 (two) times daily.   hydrochlorothiazide (MICROZIDE) 12.5 MG capsule Take 1 capsule (12.5 mg total) by mouth daily.   montelukast (SINGULAIR) 10 MG tablet TAKE 1 TABLET BY MOUTH EVERYDAY AT BEDTIME   pantoprazole (PROTONIX) 40 MG tablet Take 1 tablet (40 mg total) by mouth daily. (Needs to be seen before next refill)   sertraline (ZOLOFT) 100 MG tablet Take 1 tablet (100 mg total) by mouth daily.   Tiotropium Bromide Monohydrate (SPIRIVA RESPIMAT) 1.25 MCG/ACT AERS Inhale 1 puff into the lungs daily. (Patient taking differently: Inhale 2 puffs into the lungs at bedtime.)   Tiotropium Bromide-Olodaterol (STIOLTO RESPIMAT) 2.5-2.5 MCG/ACT AERS Inhale 2 puffs into the lungs in the morning and at bedtime.   UNABLE TO FIND    VENTOLIN HFA 108 (90 Base) MCG/ACT inhaler TAKE 2 PUFFS BY MOUTH  EVERY 6 HOURS AS NEEDED FOR WHEEZE OR SHORTNESS OF BREATH   zolpidem (AMBIEN) 10 MG tablet Take 1 tablet (10 mg total) by mouth at bedtime.   No facility-administered encounter medications on file as of 08/05/2021.    Past Surgical History:  Procedure Laterality Date   BREAST BIOPSY Right 07/2015   fibrocystic changes   CESAREAN SECTION     TONSILLECTOMY AND ADENOIDECTOMY      Family History  Problem Relation Age of Onset   Diabetes Mother    Hypertension Mother    Hyperlipidemia Father    Hypertension Father    Allergic rhinitis Neg Hx    Angioedema Neg Hx    Asthma Neg Hx    Eczema Neg Hx    Immunodeficiency Neg Hx    Urticaria Neg Hx     New complaints: - external hemorrhoids- has not been using stool softner daily - Yeast infection- wants diflucan  Social history: Lives with husband  Controlled substance contract: 06/14/21     Review of Systems  Constitutional:  Negative for diaphoresis.  Eyes:  Negative for pain.  Respiratory:  Negative for shortness of breath.   Cardiovascular:  Negative for chest pain, palpitations and leg swelling.  Gastrointestinal:  Negative for abdominal pain.  Endocrine: Negative for polydipsia.  Skin:  Negative for rash.  Neurological:  Negative for dizziness, weakness and headaches.  Hematological:  Does not bruise/bleed easily.  All other systems reviewed and are negative.     Objective:   Physical Exam Vitals and nursing note reviewed.  Constitutional:      General: She is not in acute distress.    Appearance: Normal appearance. She is well-developed.  HENT:     Head: Normocephalic.     Right Ear: Tympanic membrane normal.     Left Ear: Tympanic membrane normal.     Nose: Nose normal.     Mouth/Throat:     Mouth: Mucous membranes are moist.  Eyes:     Pupils: Pupils are equal, round, and reactive to light.  Neck:     Vascular: No carotid bruit or JVD.  Cardiovascular:     Rate and Rhythm: Normal rate and regular  rhythm.     Heart sounds: Normal heart sounds.  Pulmonary:     Effort: Pulmonary effort is normal. No respiratory distress.     Breath sounds: Normal breath sounds. No wheezing or rales.  Chest:     Chest wall: No tenderness.  Abdominal:     General: Bowel sounds are normal. There is no distension or abdominal bruit.     Palpations: Abdomen is soft. There is no hepatomegaly, splenomegaly, mass  or pulsatile mass.     Tenderness: There is no abdominal tenderness.  Musculoskeletal:        General: Normal range of motion.     Cervical back: Normal range of motion and neck supple.  Lymphadenopathy:     Cervical: No cervical adenopathy.  Skin:    General: Skin is warm and dry.  Neurological:     Mental Status: She is alert and oriented to person, place, and time.     Deep Tendon Reflexes: Reflexes are normal and symmetric.  Psychiatric:        Behavior: Behavior normal.        Thought Content: Thought content normal.        Judgment: Judgment normal.    BP 126/81   Pulse 80   Temp (!) 97.3 F (36.3 C) (Temporal)   Resp 20   Ht 5\' 4"  (1.626 m)   Wt 164 lb (74.4 kg)   SpO2 99%   BMI 28.15 kg/m        Assessment & Plan:  Christine [name] comes in today with chief complaint of Medical Management of Chronic Issues and Hemorrhoids   Diagnosis and orders addressed:  1. Primary hypertension Low sodium diet - hydrochlorothiazide (MICROZIDE) 12.5 MG capsule; Take 1 capsule (12.5 mg total) by mouth daily.  Dispense: 90 capsule; Refill: 1  2. Mild persistent asthma, uncomplicated - montelukast (SINGULAIR) 10 MG tablet; TAKE 1 TABLET BY MOUTH EVERYDAY AT BEDTIME  Dispense: 90 tablet; Refill: 1 - Tiotropium Bromide Monohydrate (SPIRIVA RESPIMAT) 1.25 MCG/ACT AERS; Inhale 1 puff into the lungs daily.  Dispense: 4 g; Refill: 5  3. Gastroesophageal reflux disease, unspecified whether esophagitis present Avoid spicy foods Do not eat 2 hours prior to bedtime   4. Generalized anxiety  disorder Stress management - ALPRAZolam (XANAX) 0.25 MG tablet; 1 po daily prn  Dispense: 20 tablet; Refill: 2 - sertraline (ZOLOFT) 100 MG tablet; Take 1 tablet (100 mg total) by mouth daily.  Dispense: 90 tablet; Refill: 0  5. Primary insomnia Bedtime routine  6. Vitamin D deficiency Daily vitamin d suplement  7. BMI 28.0-28.9,adult Discussed diet and exercise for person with BMI >25 Will recheck weight in 3-6 months  8. Grade I hemorrhoids Daily miralax - hydrocortisone (ANUSOL-HC) 25 MG suppository; Place 1 suppository (25 mg total) rectally 2 (two) times daily.  Dispense: 12 suppository; Refill: 0  9. Vaginal candidiasis - fluconazole (DIFLUCAN) 150 MG tablet; Take 1 tablet (150 mg total) by mouth once for 1 dose.  Dispense: 1 tablet; Refill: 0  10. Gastroesophageal reflux disease Avoid spicy foods Do not eat 2 hours prior to bedtime - pantoprazole (PROTONIX) 40 MG tablet; Take 1 tablet (40 mg total) by mouth daily. (Needs to be seen before next refill)  Dispense: 90 tablet; Refill: 1   Labs pending Health Maintenance reviewed Diet and exercise encouraged  Follow up plan: 6 months   Mary-Margaret 01-04-1999, FNP

## 2021-09-05 ENCOUNTER — Encounter: Payer: Self-pay | Admitting: Family

## 2021-09-05 ENCOUNTER — Ambulatory Visit: Payer: BC Managed Care – PPO | Admitting: Family

## 2021-09-05 ENCOUNTER — Other Ambulatory Visit: Payer: Self-pay

## 2021-09-05 VITALS — BP 136/88 | HR 90 | Temp 96.7°F | Wt 164.4 lb

## 2021-09-05 DIAGNOSIS — N898 Other specified noninflammatory disorders of vagina: Secondary | ICD-10-CM | POA: Diagnosis not present

## 2021-09-05 DIAGNOSIS — R300 Dysuria: Secondary | ICD-10-CM | POA: Diagnosis not present

## 2021-09-05 DIAGNOSIS — B3731 Acute candidiasis of vulva and vagina: Secondary | ICD-10-CM | POA: Diagnosis not present

## 2021-09-05 LAB — WET PREP FOR TRICH, YEAST, CLUE
Clue Cell Exam: NEGATIVE
Trichomonas Exam: NEGATIVE

## 2021-09-05 MED ORDER — FLUCONAZOLE 150 MG PO TABS: 150.0000 mg | ORAL_TABLET | ORAL | 0 refills | Status: DC | PRN

## 2021-09-05 NOTE — Progress Notes (Signed)
   Subjective:    Patient ID: Christine [name], female    DOB: 09-27-1976, 45 y.o.   MRN: [id number]  Chief Complaint  Patient presents with   Vaginitis    Burning, little white discharge denies odor     Dysuria  This is a new problem.  Vaginal Discharge The patient's pertinent negatives include no genital itching, genital odor or vaginal bleeding. The current episode started in the past 7 days. Associated symptoms include painful intercourse. Pertinent negatives include no discolored urine. The vaginal discharge was milky.     Review of Systems  All other systems reviewed and are negative.     Objective:   Physical Exam Vitals reviewed.  Constitutional:      General: She is not in acute distress.    Appearance: She is well-developed.  HENT:     [name]: Normocephalic and atraumatic.     Right Ear: External ear normal.  Eyes:     Pupils: Pupils are equal, round, and reactive to light.  Neck:     Thyroid: No thyromegaly.  Cardiovascular:     Rate and Rhythm: Normal rate and regular rhythm.     Heart sounds: Normal heart sounds. No murmur heard. Pulmonary:     Effort: Pulmonary effort is normal. No respiratory distress.     Breath sounds: Normal breath sounds. No wheezing.  Abdominal:     General: Bowel sounds are normal. There is no distension.     Palpations: Abdomen is soft.     Tenderness: There is no abdominal tenderness.  Musculoskeletal:        General: No tenderness. Normal range of motion.     Cervical back: Normal range of motion and neck supple.  Skin:    General: Skin is warm and dry.  Neurological:     Mental Status: She is alert and oriented to person, place, and time.     Cranial Nerves: No cranial nerve deficit.     Deep Tendon Reflexes: Reflexes are normal and symmetric.  Psychiatric:        Behavior: Behavior normal.        Thought Content: Thought content normal.        Judgment: Judgment normal.     BP 136/88   Pulse 90   Temp (!) 96.7 F  (35.9 C) (Temporal)   Wt 164 lb 6.4 oz (74.6 kg)   BMI 28.22 kg/m       Assessment & Plan:   Christine [name] comes in today with chief complaint of Vaginitis (Burning, little white discharge denies odor )   Diagnosis and orders addressed:  1. Vaginal irritation - WET PREP FOR TRICH, YEAST, CLUE  2. Vagina, candidiasis -Keep clean and dry Daily yougurt - fluconazole (DIFLUCAN) 150 MG tablet; Take 1 tablet (150 mg total) by mouth every three (3) days as needed.  Dispense: 3 tablet; Refill: 0  3. Dysuria - Urinalysis, Complete      Jannifer Rodney, FNP

## 2021-09-05 NOTE — Patient Instructions (Signed)
Vaginal Yeast Infection, Adult °Vaginal yeast infection is a condition that causes vaginal discharge as well as soreness, swelling, and redness (inflammation) of the vagina. This is a common condition. Some women get this infection frequently. °What are the causes? °This condition is caused by a change in the normal balance of the yeast (Candida) and normal bacteria that live in the vagina. This change causes an overgrowth of yeast, which causes the inflammation. °What increases the risk? °The condition is more likely to develop in women who: °Take antibiotic medicines. °Have diabetes. °Take birth control pills. °Are pregnant. °Douche often. °Have a weak body defense system (immune system). °Have been taking steroid medicines for a long time. °Frequently wear tight clothing. °What are the signs or symptoms? °Symptoms of this condition include: °White, thick, creamy vaginal discharge. °Swelling, itching, redness, and irritation of the vagina. The lips of the vagina (labia) may be affected as well. °Pain or a burning feeling while urinating. °Pain during sex. °How is this diagnosed? °This condition is diagnosed based on: °Your medical history. °A physical exam. °A pelvic exam. Your health care provider will examine a sample of your vaginal discharge under a microscope. Your health care provider may send this sample for testing to confirm the diagnosis. °How is this treated? °This condition is treated with medicine. Medicines may be over-the-counter or prescription. You may be told to use one or more of the following: °Medicine that is taken by mouth (orally). °Medicine that is applied as a cream (topically). °Medicine that is inserted directly into the vagina (suppository). °Follow these instructions at home: °Take or apply over-the-counter and prescription medicines only as told by your health care provider. °Do not use tampons until your health care provider approves. °Do not have sex until your infection has  cleared. Sex can prolong or worsen your symptoms of infection. Ask your health care provider when it is safe to resume sexual activity. °Keep all follow-up visits. This is important. °How is this prevented? ° °Do not wear tight clothes, such as pantyhose or tight pants. °Wear breathable cotton underwear. °Do not use douches, perfumed soap, creams, or powders. °Wipe from front to back after using the toilet. °If you have diabetes, keep your blood sugar levels under control. °Ask your health care provider for other ways to prevent yeast infections. °Contact a health care provider if: °You have a fever. °Your symptoms go away and then return. °Your symptoms do not get better with treatment. °Your symptoms get worse. °You have new symptoms. °You develop blisters in or around your vagina. °You have blood coming from your vagina and it is not your menstrual period. °You develop pain in your abdomen. °Summary °Vaginal yeast infection is a condition that causes discharge as well as soreness, swelling, and redness (inflammation) of the vagina. °This condition is treated with medicine. Medicines may be over-the-counter or prescription. °Take or apply over-the-counter and prescription medicines only as told by your health care provider. °Do not douche. Resume sexual activity or use of tampons as instructed by your health care provider. °Contact a health care provider if your symptoms do not get better with treatment or your symptoms go away and then return. °This information is not intended to replace advice given to you by your health care provider. Make sure you discuss any questions you have with your health care provider. °Document Revised: 01/28/2021 Document Reviewed: 01/28/2021 °Elsevier Patient Education © 2022 [employer] ° °

## 2021-09-06 ENCOUNTER — Telehealth: Payer: Self-pay | Admitting: Nurse Practitioner

## 2021-09-06 ENCOUNTER — Other Ambulatory Visit: Payer: BC Managed Care – PPO

## 2021-09-06 ENCOUNTER — Other Ambulatory Visit: Payer: Self-pay | Admitting: *Deleted

## 2021-09-06 DIAGNOSIS — R300 Dysuria: Secondary | ICD-10-CM

## 2021-09-06 LAB — URINALYSIS, COMPLETE
Bilirubin, UA: NEGATIVE
Glucose, UA: NEGATIVE
Ketones, UA: NEGATIVE
Leukocytes,UA: NEGATIVE
Nitrite, UA: NEGATIVE
Protein,UA: NEGATIVE
RBC, UA: NEGATIVE
Specific Gravity, UA: 1.015 (ref 1.005–1.030)
Urobilinogen, Ur: 0.2 mg/dL (ref 0.2–1.0)
pH, UA: 7 (ref 5.0–7.5)

## 2021-09-06 LAB — MICROSCOPIC EXAMINATION
RBC, Urine: NONE SEEN /hpf (ref 0–2)
Renal Epithel, UA: NONE SEEN /hpf

## 2021-09-06 NOTE — Telephone Encounter (Signed)
No urine found - called pt - coming today to leave another sample

## 2021-09-09 ENCOUNTER — Ambulatory Visit: Payer: BC Managed Care – PPO | Admitting: Nurse Practitioner

## 2021-09-09 ENCOUNTER — Encounter: Payer: Self-pay | Admitting: Nurse Practitioner

## 2021-09-09 ENCOUNTER — Other Ambulatory Visit: Payer: Self-pay

## 2021-09-09 ENCOUNTER — Telehealth: Payer: Self-pay | Admitting: Family

## 2021-09-09 DIAGNOSIS — N898 Other specified noninflammatory disorders of vagina: Secondary | ICD-10-CM

## 2021-09-09 DIAGNOSIS — B3731 Acute candidiasis of vulva and vagina: Secondary | ICD-10-CM

## 2021-09-09 MED ORDER — FLUCONAZOLE 150 MG PO TABS: 150.0000 mg | ORAL_TABLET | ORAL | 0 refills | Status: DC | PRN

## 2021-09-09 NOTE — Assessment & Plan Note (Signed)
Symptoms not well controlled, even with fluconazole. Completed wet prep, thorough cervical and vaginal exam, urinalysis, GC and chlamydia-results pending. . Education provided to patient, printed hand out given. Patient knows to follow up with unresolved symptoms.

## 2021-09-09 NOTE — Telephone Encounter (Signed)
Sent in another rdiflucan- eat yogurt and use a one day monistat as well.

## 2021-09-09 NOTE — Progress Notes (Signed)
Acute Office Visit  Subjective:    Patient ID: Christine [name], female    DOB: 1976/05/08, 45 y.o.   MRN: [id number]  Chief Complaint  Patient presents with   vaginal burning    Vaginal Discharge The patient's primary symptoms include vaginal discharge. This is a recurrent problem. The current episode started in the past 7 days. The problem occurs constantly. The problem has been gradually worsening. The pain is moderate. The problem affects both sides. She is not pregnant. Pertinent negatives include no abdominal pain, back pain, fever, flank pain, nausea or rash. The vaginal discharge was copious and milky. There has been no bleeding. She has not been passing clots. She has not been passing tissue. Nothing aggravates the symptoms. She has tried antifungals for the symptoms. The treatment provided no relief. She is sexually active. It is unknown whether or not her partner has an STD. She uses nothing for contraception.    Past Medical History:  Diagnosis Date   Asthma    GAD (generalized anxiety disorder)    Vitamin D deficiency     Past Surgical History:  Procedure Laterality Date   BREAST BIOPSY Right 07/2015   fibrocystic changes   CESAREAN SECTION     TONSILLECTOMY AND ADENOIDECTOMY      Family History  Problem Relation Age of Onset   Diabetes Mother    Hypertension Mother    Hyperlipidemia Father    Hypertension Father    Allergic rhinitis Neg Hx    Angioedema Neg Hx    Asthma Neg Hx    Eczema Neg Hx    Immunodeficiency Neg Hx    Urticaria Neg Hx     Social History   Socioeconomic History   Marital status: Married    Spouse name: Not on file   Number of children: Not on file   Years of education: Not on file   Highest education level: Not on file  Occupational History   Not on file  Tobacco Use   Smoking status: Never   Smokeless tobacco: Never  Vaping Use   Vaping Use: Never used  Substance and Sexual Activity   Alcohol use: Yes   Drug use: No    Sexual activity: Not on file  Other Topics Concern   Not on file  Social History Narrative   Not on file   Social Determinants of Health   Financial Resource Strain: Not on file  Food Insecurity: Not on file  Transportation Needs: Not on file  Physical Activity: Not on file  Stress: Not on file  Social Connections: Not on file  Intimate Partner Violence: Not on file    Outpatient Medications Prior to Visit  Medication Sig Dispense Refill   albuterol (PROVENTIL) (2.5 MG/3ML) 0.083% nebulizer solution Take 3 mLs (2.5 mg total) by nebulization every 4 (four) hours as needed for wheezing or shortness of breath. 75 mL 1   ALPRAZolam (XANAX) 0.25 MG tablet 1 po daily prn 20 tablet 2   Cholecalciferol (VITAMIN D3) 1000 units CAPS Take by mouth.     ethynodiol-ethinyl estradiol Marliss Coots 1/35) 1-35 MG-MCG tablet Take 1 tablet by mouth daily. 84 tablet 3   famotidine (PEPCID) 40 MG tablet Take 1 tablet (40 mg total) by mouth in the morning and at bedtime. 60 tablet 5   fluconazole (DIFLUCAN) 150 MG tablet Take 1 tablet (150 mg total) by mouth every three (3) days as needed. 3 tablet 0   fluticasone (FLOVENT HFA) 110 MCG/ACT inhaler  Inhale 2 puffs into the lungs 2 (two) times daily. 2 Inhaler 3   Fluticasone Propionate (XHANCE) 93 MCG/ACT EXHU Place 2 sprays into the nose 2 (two) times daily. 32 mL 5   hydrochlorothiazide (MICROZIDE) 12.5 MG capsule Take 1 capsule (12.5 mg total) by mouth daily. 90 capsule 1   hydrocortisone (ANUSOL-HC) 25 MG suppository Place 1 suppository (25 mg total) rectally 2 (two) times daily. 12 suppository 0   Hydrocortisone Acetate 1 % OINT Use prn twice daily 28.4 g 1   montelukast (SINGULAIR) 10 MG tablet TAKE 1 TABLET BY MOUTH EVERYDAY AT BEDTIME 90 tablet 1   pantoprazole (PROTONIX) 40 MG tablet Take 1 tablet (40 mg total) by mouth daily. (Needs to be seen before next refill) 90 tablet 1   sertraline (ZOLOFT) 100 MG tablet Take 1 tablet (100 mg total) by mouth  daily. 90 tablet 0   Tiotropium Bromide-Olodaterol (STIOLTO RESPIMAT) 2.5-2.5 MCG/ACT AERS Inhale 2 puffs into the lungs in the morning and at bedtime. 4 g 5   UNABLE TO FIND      VENTOLIN HFA 108 (90 Base) MCG/ACT inhaler TAKE 2 PUFFS BY MOUTH EVERY 6 HOURS AS NEEDED FOR WHEEZE OR SHORTNESS OF BREATH 18 g 2   zolpidem (AMBIEN) 10 MG tablet Take 1 tablet (10 mg total) by mouth at bedtime. 30 tablet 5   Tiotropium Bromide Monohydrate (SPIRIVA RESPIMAT) 1.25 MCG/ACT AERS Inhale 1 puff into the lungs daily. 4 g 5   No facility-administered medications prior to visit.    Allergies  Allergen Reactions   Sulfa Antibiotics Hives   Sulfasalazine Hives   Doxycycline Hyclate    Ivp Dye [Iodinated Diagnostic Agents] Hives   Other     ALPHA GAL - MEAT ALLERGY    Paxil [Paroxetine Hcl] Other (See Comments)    Made pt very angry    Phosphorus Diarrhea    Review of Systems  Constitutional:  Negative for fever.  Gastrointestinal:  Negative for abdominal pain and nausea.  Genitourinary:  Positive for vaginal discharge. Negative for flank pain.  Musculoskeletal:  Negative for back pain.  Skin:  Negative for rash.  All other systems reviewed and are negative.     Objective:    Physical Exam Vitals and nursing note reviewed. Exam conducted with a chaperone present (Nurse GINA).  Constitutional:      Appearance: Normal appearance.  HENT:     Head: Normocephalic.     Nose: Nose normal.     Mouth/Throat:     Mouth: Mucous membranes are moist.     Pharynx: Oropharynx is clear.  Eyes:     Conjunctiva/sclera: Conjunctivae normal.  Cardiovascular:     Rate and Rhythm: Normal rate and regular rhythm.  Pulmonary:     Effort: Pulmonary effort is normal.     Breath sounds: Normal breath sounds.  Abdominal:     General: Bowel sounds are normal.  Genitourinary:    Labia:        Right: Tenderness present.        Comments: Raw slight abrasion Skin:    General: Skin is warm.     Findings:  No rash.  Neurological:     Mental Status: She is alert and oriented to person, place, and time.    There were no vitals taken for this visit. Wt Readings from Last 3 Encounters:  09/05/21 164 lb 6.4 oz (74.6 kg)  08/05/21 164 lb (74.4 kg)  06/13/21 173 lb (78.5 kg)  Health Maintenance Due  Topic Date Due   COVID-19 Vaccine (3 - Booster for Moderna series) 04/22/2021    There are no preventive care reminders to display for this patient.   Lab Results  Component Value Date   TSH 0.727 12/03/2020   Lab Results  Component Value Date   WBC 7.0 06/13/2021   HGB 13.2 06/13/2021   HCT 41.0 06/13/2021   MCV 88 06/13/2021   PLT 255 06/13/2021   Lab Results  Component Value Date   NA 140 06/13/2021   K 4.7 06/13/2021   CO2 22 06/13/2021   GLUCOSE 86 06/13/2021   BUN 8 06/13/2021   CREATININE 0.79 06/13/2021   BILITOT <0.2 06/13/2021   ALKPHOS 77 06/13/2021   AST 15 06/13/2021   ALT 17 06/13/2021   PROT 7.0 06/13/2021   ALBUMIN 4.2 06/13/2021   CALCIUM 9.6 06/13/2021   ANIONGAP 11 03/07/2021   EGFR 93 06/13/2021   Lab Results  Component Value Date   CHOL 261 (H) 06/13/2021   Lab Results  Component Value Date   HDL 58 06/13/2021   Lab Results  Component Value Date   LDLCALC 180 (H) 06/13/2021   Lab Results  Component Value Date   TRIG 126 06/13/2021   Lab Results  Component Value Date   CHOLHDL 4.5 (H) 06/13/2021   No results found for: HGBA1C     Assessment & Plan:   Problem List Items Addressed This Visit       Other   Vaginal discharge - Primary    Symptoms not well controlled, even with fluconazole. Completed wet prep, thorough cervical and vaginal exam, urinalysis, GC and chlamydia-results pending. . Education provided to patient, printed hand out given. Patient knows to follow up with unresolved symptoms.       Relevant Orders   WET PREP FOR TRICH, YEAST, CLUE   Urinalysis, Routine w reflex microscopic   GC/Chlamydia probe amp ([facility])not at [facility]     No orders of the defined types were placed in this encounter.    Ivy Lynn, NP

## 2021-09-09 NOTE — Telephone Encounter (Signed)
Patient aware and states she has an appointment with JE today and she was going to keep her appointment.

## 2021-09-10 ENCOUNTER — Encounter: Payer: Self-pay | Admitting: Nurse Practitioner

## 2021-09-10 LAB — WET PREP FOR TRICH, YEAST, CLUE
Clue Cell Exam: NEGATIVE
Trichomonas Exam: NEGATIVE
Yeast Exam: NEGATIVE

## 2021-09-10 LAB — URINALYSIS, ROUTINE W REFLEX MICROSCOPIC
Bilirubin, UA: NEGATIVE
Glucose, UA: NEGATIVE
Ketones, UA: NEGATIVE
Leukocytes,UA: NEGATIVE
Nitrite, UA: NEGATIVE
Protein,UA: NEGATIVE
RBC, UA: NEGATIVE
Specific Gravity, UA: 1.015 (ref 1.005–1.030)
Urobilinogen, Ur: 0.2 mg/dL (ref 0.2–1.0)
pH, UA: 8 — ABNORMAL HIGH (ref 5.0–7.5)

## 2021-09-10 NOTE — Progress Notes (Signed)
Pt calling back asking to speak to [place] about labs

## 2021-09-11 DIAGNOSIS — N898 Other specified noninflammatory disorders of vagina: Secondary | ICD-10-CM | POA: Diagnosis not present

## 2021-09-12 DIAGNOSIS — N898 Other specified noninflammatory disorders of vagina: Secondary | ICD-10-CM | POA: Diagnosis not present

## 2021-09-13 ENCOUNTER — Other Ambulatory Visit: Payer: Self-pay | Admitting: Nurse Practitioner

## 2021-09-13 DIAGNOSIS — J4530 Mild persistent asthma, uncomplicated: Secondary | ICD-10-CM

## 2021-09-16 ENCOUNTER — Telehealth: Payer: Self-pay | Admitting: Nurse Practitioner

## 2021-09-16 DIAGNOSIS — F419 Anxiety disorder, unspecified: Secondary | ICD-10-CM

## 2021-09-16 NOTE — Telephone Encounter (Signed)
Pt called to let MMM know that she needs her to send referral to [facility] in [place]  so that she can start seeing someone for her anxiety.  Can fax referral with OV notes and Diag codes to [phone].  Call pt with update on referral.

## 2021-09-17 ENCOUNTER — Telehealth: Payer: Self-pay | Admitting: Nurse Practitioner

## 2021-09-17 DIAGNOSIS — N898 Other specified noninflammatory disorders of vagina: Secondary | ICD-10-CM | POA: Diagnosis not present

## 2021-09-17 NOTE — Telephone Encounter (Signed)
Previous message was sent to PCP- will contact patient once she replies

## 2021-09-17 NOTE — Telephone Encounter (Signed)
Pt calling to check on referral. Unable to attach to other message. Please call back and advise.

## 2021-09-24 DIAGNOSIS — Z79899 Other long term (current) drug therapy: Secondary | ICD-10-CM | POA: Diagnosis not present

## 2021-09-24 DIAGNOSIS — F411 Generalized anxiety disorder: Secondary | ICD-10-CM | POA: Diagnosis not present

## 2021-09-24 DIAGNOSIS — F410 Panic disorder [episodic paroxysmal anxiety] without agoraphobia: Secondary | ICD-10-CM | POA: Diagnosis not present

## 2021-10-04 DIAGNOSIS — K649 Unspecified hemorrhoids: Secondary | ICD-10-CM | POA: Diagnosis not present

## 2021-10-04 DIAGNOSIS — F411 Generalized anxiety disorder: Secondary | ICD-10-CM | POA: Diagnosis not present

## 2021-10-04 DIAGNOSIS — K5900 Constipation, unspecified: Secondary | ICD-10-CM | POA: Diagnosis not present

## 2021-10-04 DIAGNOSIS — K589 Irritable bowel syndrome without diarrhea: Secondary | ICD-10-CM | POA: Diagnosis not present

## 2021-10-06 ENCOUNTER — Other Ambulatory Visit: Payer: Self-pay | Admitting: Allergy & Immunology

## 2021-10-22 DIAGNOSIS — F411 Generalized anxiety disorder: Secondary | ICD-10-CM | POA: Diagnosis not present

## 2021-10-22 DIAGNOSIS — F410 Panic disorder [episodic paroxysmal anxiety] without agoraphobia: Secondary | ICD-10-CM | POA: Diagnosis not present

## 2021-11-04 ENCOUNTER — Other Ambulatory Visit: Payer: Self-pay | Admitting: Allergy & Immunology

## 2021-11-14 ENCOUNTER — Other Ambulatory Visit: Payer: Self-pay | Admitting: Nurse Practitioner

## 2021-11-14 DIAGNOSIS — J4530 Mild persistent asthma, uncomplicated: Secondary | ICD-10-CM

## 2021-11-19 DIAGNOSIS — F411 Generalized anxiety disorder: Secondary | ICD-10-CM | POA: Diagnosis not present

## 2021-11-19 DIAGNOSIS — F410 Panic disorder [episodic paroxysmal anxiety] without agoraphobia: Secondary | ICD-10-CM | POA: Diagnosis not present

## 2021-11-29 DIAGNOSIS — F321 Major depressive disorder, single episode, moderate: Secondary | ICD-10-CM | POA: Diagnosis not present

## 2021-11-29 DIAGNOSIS — H9393 Unspecified disorder of ear, bilateral: Secondary | ICD-10-CM | POA: Diagnosis not present

## 2021-12-06 ENCOUNTER — Other Ambulatory Visit: Payer: Self-pay | Admitting: Nurse Practitioner

## 2021-12-06 DIAGNOSIS — J4530 Mild persistent asthma, uncomplicated: Secondary | ICD-10-CM

## 2021-12-06 DIAGNOSIS — F321 Major depressive disorder, single episode, moderate: Secondary | ICD-10-CM | POA: Diagnosis not present

## 2021-12-12 ENCOUNTER — Other Ambulatory Visit: Payer: Self-pay | Admitting: Nurse Practitioner

## 2021-12-12 ENCOUNTER — Telehealth: Payer: Self-pay | Admitting: Nurse Practitioner

## 2021-12-12 DIAGNOSIS — F5101 Primary insomnia: Secondary | ICD-10-CM

## 2021-12-12 MED ORDER — ZOLPIDEM TARTRATE 10 MG PO TABS: 10.0000 mg | ORAL_TABLET | Freq: Every day | ORAL | 0 refills | Status: DC

## 2021-12-12 NOTE — Telephone Encounter (Signed)
Patient notified and verbalized understanding. 

## 2021-12-12 NOTE — Progress Notes (Signed)
Ambien refilled for 1 month- has appointment 12/17/21

## 2021-12-12 NOTE — Telephone Encounter (Signed)
I will be glad to refill your ambien. We cannot always gguarantee that your follow up appointmnet will be befor etypu run out of meds. We try to consider tat when we are making follow up appointmnets. But I do not mind you calling and letting me know you are out of meds prior to an appointmnet. Since Christine [name] is a controlled drug when can only do so many refills at a time.

## 2021-12-13 DIAGNOSIS — F321 Major depressive disorder, single episode, moderate: Secondary | ICD-10-CM | POA: Diagnosis not present

## 2021-12-17 ENCOUNTER — Ambulatory Visit (INDEPENDENT_AMBULATORY_CARE_PROVIDER_SITE_OTHER): Payer: BC Managed Care – PPO | Admitting: Nurse Practitioner

## 2021-12-17 ENCOUNTER — Encounter: Payer: Self-pay | Admitting: Nurse Practitioner

## 2021-12-17 VITALS — BP 130/89 | HR 120 | Temp 97.5°F | Resp 20 | Ht 64.0 in | Wt 144.0 lb

## 2021-12-17 DIAGNOSIS — Z6824 Body mass index (BMI) 24.0-24.9, adult: Secondary | ICD-10-CM

## 2021-12-17 DIAGNOSIS — E559 Vitamin D deficiency, unspecified: Secondary | ICD-10-CM

## 2021-12-17 DIAGNOSIS — K219 Gastro-esophageal reflux disease without esophagitis: Secondary | ICD-10-CM

## 2021-12-17 DIAGNOSIS — F5101 Primary insomnia: Secondary | ICD-10-CM

## 2021-12-17 DIAGNOSIS — I10 Essential (primary) hypertension: Secondary | ICD-10-CM

## 2021-12-17 DIAGNOSIS — M26629 Arthralgia of temporomandibular joint, unspecified side: Secondary | ICD-10-CM

## 2021-12-17 DIAGNOSIS — F411 Generalized anxiety disorder: Secondary | ICD-10-CM

## 2021-12-17 DIAGNOSIS — R030 Elevated blood-pressure reading, without diagnosis of hypertension: Secondary | ICD-10-CM

## 2021-12-17 DIAGNOSIS — J4530 Mild persistent asthma, uncomplicated: Secondary | ICD-10-CM

## 2021-12-17 DIAGNOSIS — R6889 Other general symptoms and signs: Secondary | ICD-10-CM | POA: Diagnosis not present

## 2021-12-17 MED ORDER — ZOLPIDEM TARTRATE 10 MG PO TABS: 10.0000 mg | ORAL_TABLET | Freq: Every day | ORAL | 5 refills | Status: DC

## 2021-12-17 MED ORDER — FLUTICASONE PROPIONATE HFA 110 MCG/ACT IN AERO: 2.0000 | INHALATION_SPRAY | Freq: Two times a day (BID) | RESPIRATORY_TRACT | 3 refills | Status: DC

## 2021-12-17 MED ORDER — ZOLPIDEM TARTRATE 10 MG PO TABS: 10.0000 mg | ORAL_TABLET | Freq: Every day | ORAL | 0 refills | Status: DC

## 2021-12-17 MED ORDER — MONTELUKAST SODIUM 10 MG PO TABS: ORAL_TABLET | ORAL | 1 refills | Status: DC

## 2021-12-17 MED ORDER — HYDROCHLOROTHIAZIDE 12.5 MG PO CAPS: 12.5000 mg | ORAL_CAPSULE | Freq: Every day | ORAL | 1 refills | Status: DC

## 2021-12-17 MED ORDER — ALPRAZOLAM 0.25 MG PO TABS: ORAL_TABLET | ORAL | 2 refills | Status: DC

## 2021-12-17 MED ORDER — VIIBRYD STARTER PACK 10 & 20 MG PO KIT: 1.0000 | PACK | Freq: Every day | ORAL | 0 refills | Status: DC

## 2021-12-17 MED ORDER — PANTOPRAZOLE SODIUM 40 MG PO TBEC: 40.0000 mg | DELAYED_RELEASE_TABLET | Freq: Every day | ORAL | 1 refills | Status: DC

## 2021-12-17 NOTE — Patient Instructions (Signed)

## 2021-12-17 NOTE — Progress Notes (Signed)
Subjective:    Patient ID: Christine [name], female    DOB: 12-11-1975, 46 y.o.   MRN: [id number]   Chief Complaint: medical management of chronic issues     HPI:  Christine [name] is a 46 y.o. who identifies as a female who was assigned female at birth.   Social history: Lives with: husband Work history: health department   Comes in today for follow up of the following chronic medical issues:  1. Primary hypertension No c/o chest pain, sob or headache. Chesks blod pressure at work occasionally. BP Readings from Last 3 Encounters:  09/05/21 136/88  08/05/21 126/81  06/13/21 (!) 150/96     2. White coat syndrome with high blood pressure without hypertension Her blood pressure is always high when she first comes into the office.  3. Gastroesophageal reflux disease, unspecified whether esophagitis present Is on protonix and pepcid daily and is doing well.  4. Mild persistent asthma, uncomplicated Is on flovent inhaler and stiolto is doing well.   5. TMJ pain dysfunction syndrome Worsens when she is under stress  6. Primary insomnia Is on ambien to sleep at night.   7. Generalized anxiety disorder Stays very anxious. Worries about everything. She sees a therapist and was recently put on pristiq and that has flared up her IBS. She was on zoloft prior to that. She has also tried celexa in the past. GAD 7 : Generalized Anxiety Score 12/17/2021 08/05/2021 06/13/2021 12/03/2020  Nervous, Anxious, on Edge 3 3 0 3  Control/stop worrying 3 3 0 0  Worry too much - different things 3 3 0 0  Trouble relaxing 2 2 0 1  Restless 0 0 1 1  Easily annoyed or irritable 1 3 1 1   Afraid - awful might happen 0 2 0 1  Total GAD 7 Score 12 16 2 7   Anxiety Difficulty Somewhat difficult Somewhat difficult Somewhat difficult Not difficult at all      8. Vitamin D deficiency Is on daily vitamin d supplement  9. BMI 28.0-328.9,adult Weight is down 20lbs Wt Readings from Last 3 Encounters:   12/17/21 144 lb (65.3 kg)  09/05/21 164 lb 6.4 oz (74.6 kg)  08/05/21 164 lb (74.4 kg)   BMI Readings from Last 3 Encounters:  12/17/21 24.72 kg/m  09/05/21 28.22 kg/m  08/05/21 28.15 kg/m     New complaints: None today other then her anxiety  Allergies  Allergen Reactions   Sulfa Antibiotics Hives   Sulfasalazine Hives   Doxycycline Hyclate    Ivp Dye [Iodinated Contrast Media] Hives   Other     ALPHA GAL - MEAT ALLERGY    Paxil [Paroxetine Hcl] Other (See Comments)    Made pt very angry    Phosphorus Diarrhea   Outpatient Encounter Medications as of 12/17/2021  Medication Sig   albuterol (PROVENTIL) (2.5 MG/3ML) 0.083% nebulizer solution Take 3 mLs (2.5 mg total) by nebulization every 4 (four) hours as needed for wheezing or shortness of breath.   ALPRAZolam (XANAX) 0.25 MG tablet 1 po daily prn   Cholecalciferol (VITAMIN D3) 1000 units CAPS Take by mouth.   famotidine (PEPCID) 40 MG tablet Take 1 tablet (40 mg total) by mouth in the morning and at bedtime.   fluconazole (DIFLUCAN) 150 MG tablet Take 1 tablet (150 mg total) by mouth every three (3) days as needed.   fluticasone (FLOVENT HFA) 110 MCG/ACT inhaler Inhale 2 puffs into the lungs 2 (two) times daily.   Fluticasone  Propionate (XHANCE) 93 MCG/ACT EXHU Place 2 sprays into the nose 2 (two) times daily.   hydrochlorothiazide (MICROZIDE) 12.5 MG capsule Take 1 capsule (12.5 mg total) by mouth daily.   hydrocortisone (ANUSOL-HC) 25 MG suppository Place 1 suppository (25 mg total) rectally 2 (two) times daily.   Hydrocortisone Acetate 1 % OINT Use prn twice daily   [facility] 1/35 1-35 MG-MCG tablet TAKE 1 TABLET BY MOUTH EVERY DAY   montelukast (SINGULAIR) 10 MG tablet TAKE 1 TABLET BY MOUTH EVERYDAY AT BEDTIME   pantoprazole (PROTONIX) 40 MG tablet Take 1 tablet (40 mg total) by mouth daily. (Needs to be seen before next refill)   sertraline (ZOLOFT) 100 MG tablet Take 1 tablet (100 mg total) by mouth daily.    STIOLTO RESPIMAT 2.5-2.5 MCG/ACT AERS INHALE 2 PUFFS INTO THE LUNGS IN THE MORNING AND AT BEDTIME.   UNABLE TO FIND    VENTOLIN HFA 108 (90 Base) MCG/ACT inhaler TAKE 2 PUFFS BY MOUTH EVERY 6 HOURS AS NEEDED FOR WHEEZE OR SHORTNESS OF BREATH   zolpidem (AMBIEN) 10 MG tablet Take 1 tablet (10 mg total) by mouth at bedtime.   No facility-administered encounter medications on file as of 12/17/2021.    Past Surgical History:  Procedure Laterality Date   BREAST BIOPSY Right 07/2015   fibrocystic changes   CESAREAN SECTION     TONSILLECTOMY AND ADENOIDECTOMY      Family History  Problem Relation Age of Onset   Diabetes Mother    Hypertension Mother    Hyperlipidemia Father    Hypertension Father    Allergic rhinitis Neg Hx    Angioedema Neg Hx    Asthma Neg Hx    Eczema Neg Hx    Immunodeficiency Neg Hx    Urticaria Neg Hx       Controlled substance contract: 06/14/21     Review of Systems  Constitutional:  Negative for diaphoresis.  Eyes:  Negative for pain.  Respiratory:  Negative for shortness of breath.   Cardiovascular:  Negative for chest pain, palpitations and leg swelling.  Gastrointestinal:  Negative for abdominal pain.  Endocrine: Negative for polydipsia.  Skin:  Negative for rash.  Neurological:  Negative for dizziness, weakness and headaches.  Hematological:  Does not bruise/bleed easily.  All other systems reviewed and are negative.     Objective:   Physical Exam Vitals and nursing note reviewed.  Constitutional:      General: She is not in acute distress.    Appearance: Normal appearance. She is well-developed.  HENT:     Head: Normocephalic.     Right Ear: Tympanic membrane normal.     Left Ear: Tympanic membrane normal.     Nose: Nose normal.     Mouth/Throat:     Mouth: Mucous membranes are moist.  Eyes:     Pupils: Pupils are equal, round, and reactive to light.  Neck:     Vascular: No carotid bruit or JVD.  Cardiovascular:     Rate and  Rhythm: Normal rate and regular rhythm.     Heart sounds: Normal heart sounds.  Pulmonary:     Effort: Pulmonary effort is normal. No respiratory distress.     Breath sounds: Normal breath sounds. No wheezing or rales.  Chest:     Chest wall: No tenderness.  Abdominal:     General: Bowel sounds are normal. There is no distension or abdominal bruit.     Palpations: Abdomen is soft. There is no hepatomegaly,  splenomegaly, mass or pulsatile mass.     Tenderness: There is no abdominal tenderness.  Musculoskeletal:        General: Normal range of motion.     Cervical back: Normal range of motion and neck supple.  Lymphadenopathy:     Cervical: No cervical adenopathy.  Skin:    General: Skin is warm and dry.  Neurological:     Mental Status: She is alert and oriented to person, place, and time.     Deep Tendon Reflexes: Reflexes are normal and symmetric.  Psychiatric:        Behavior: Behavior normal.        Thought Content: Thought content normal.        Judgment: Judgment normal.    BP 130/89    Pulse (!) 120    Temp (!) 97.5 F (36.4 C) (Temporal)    Resp 20    Ht 5' 4"  (1.626 m)    Wt 144 lb (65.3 kg)    SpO2 100%    BMI 24.72 kg/m        Assessment & Plan:   Christine [name] comes in today with chief complaint of Medical Management of Chronic Issues   Diagnosis and orders addressed:  1. Primary hypertension Low sodium diet - hydrochlorothiazide (MICROZIDE) 12.5 MG capsule; Take 1 capsule (12.5 mg total) by mouth daily.  Dispense: 90 capsule; Refill: 1 - CBC with Differential/Platelet - Lipid panel - CMP14+EGFR  2. White coat syndrome with high blood pressure without hypertension Stress mangement  3. Gastroesophageal reflux disease, unspecified whether esophagitis present Avoid spicy foods Do not eat 2 hours prior to bedtime - pantoprazole (PROTONIX) 40 MG tablet; Take 1 tablet (40 mg total) by mouth daily. (Needs to be seen before next refill)  Dispense: 90 tablet;  Refill: 1  4. Mild persistent asthma, uncomplicated - fluticasone (FLOVENT HFA) 110 MCG/ACT inhaler; Inhale 2 puffs into the lungs 2 (two) times daily.  Dispense: 2 each; Refill: 3 - montelukast (SINGULAIR) 10 MG tablet; TAKE 1 TABLET BY MOUTH EVERYDAY AT BEDTIME  Dispense: 90 tablet; Refill: 1  5. TMJ pain dysfunction syndrome Stress management  6. Primary insomnia Bedtime routine - zolpidem (AMBIEN) 10 MG tablet; Take 1 tablet (10 mg total) by mouth at bedtime.  Dispense: 30 tablet; Refill: 5  7. Generalized anxiety disorder Gradually switch from pristiq to viibryd - ALPRAZolam (XANAX) 0.25 MG tablet; 1 po daily prn  Dispense: 20 tablet; Refill: 2 - Vilazodone HCl (VIIBRYD STARTER PACK) 10 & 20 MG KIT; Take 1 tablet by mouth daily.  Dispense: 1 kit; Refill: 0  8. Vitamin D deficiency Continue daily vitamin d supplement  9. BMI 32.0-32.9,adult Discussed diet and exercise for person with BMI >25 Will recheck weight in 3-6 months    Labs pending Health Maintenance reviewed Diet and exercise encouraged  Follow up plan: 3 weeks    [name], FNP

## 2021-12-18 LAB — CBC WITH DIFFERENTIAL/PLATELET
Basophils Absolute: 0.1 10*3/uL (ref 0.0–0.2)
Basos: 1 %
EOS (ABSOLUTE): 0 10*3/uL (ref 0.0–0.4)
Eos: 0 %
Hematocrit: 43.2 % (ref 34.0–46.6)
Hemoglobin: 15 g/dL (ref 11.1–15.9)
Immature Grans (Abs): 0 10*3/uL (ref 0.0–0.1)
Immature Granulocytes: 0 %
Lymphocytes Absolute: 1.9 10*3/uL (ref 0.7–3.1)
Lymphs: 20 %
MCH: 30.5 pg (ref 26.6–33.0)
MCHC: 34.7 g/dL (ref 31.5–35.7)
MCV: 88 fL (ref 79–97)
Monocytes Absolute: 0.5 10*3/uL (ref 0.1–0.9)
Monocytes: 5 %
Neutrophils Absolute: 7 10*3/uL (ref 1.4–7.0)
Neutrophils: 74 %
Platelets: 334 10*3/uL (ref 150–450)
RBC: 4.92 x10E6/uL (ref 3.77–5.28)
RDW: 13 % (ref 11.7–15.4)
WBC: 9.5 10*3/uL (ref 3.4–10.8)

## 2021-12-18 LAB — LIPID PANEL
Chol/HDL Ratio: 5 ratio — ABNORMAL HIGH (ref 0.0–4.4)
Cholesterol, Total: 298 mg/dL — ABNORMAL HIGH (ref 100–199)
HDL: 60 mg/dL (ref >39)
LDL Chol Calc (NIH): 228 mg/dL — ABNORMAL HIGH (ref 0–99)
Triglycerides: 69 mg/dL (ref 0–149)
VLDL Cholesterol Cal: 10 mg/dL (ref 5–40)

## 2021-12-18 LAB — CMP14+EGFR
ALT: 14 IU/L (ref 0–32)
AST: 18 IU/L (ref 0–40)
Albumin/Globulin Ratio: 1.9 (ref 1.2–2.2)
Albumin: 4.6 g/dL (ref 3.8–4.8)
Alkaline Phosphatase: 75 IU/L (ref 44–121)
BUN/Creatinine Ratio: 10 (ref 9–23)
BUN: 8 mg/dL (ref 6–24)
Bilirubin Total: 0.7 mg/dL (ref 0.0–1.2)
CO2: 23 mmol/L (ref 20–29)
Calcium: 10 mg/dL (ref 8.7–10.2)
Chloride: 93 mmol/L — ABNORMAL LOW (ref 96–106)
Creatinine, Ser: 0.77 mg/dL (ref 0.57–1.00)
Globulin, Total: 2.4 g/dL (ref 1.5–4.5)
Glucose: 91 mg/dL (ref 70–99)
Potassium: 4.4 mmol/L (ref 3.5–5.2)
Sodium: 132 mmol/L — ABNORMAL LOW (ref 134–144)
Total Protein: 7 g/dL (ref 6.0–8.5)
eGFR: 96 mL/min/{1.73_m2} (ref >59)

## 2021-12-18 MED ORDER — ROSUVASTATIN CALCIUM 20 MG PO TABS: 20.0000 mg | ORAL_TABLET | Freq: Every day | ORAL | 1 refills | Status: DC

## 2021-12-18 NOTE — Addendum Note (Signed)
Addended by: Bennie Pierini on: 12/18/2021 09:26 AM   Modules accepted: Orders

## 2021-12-20 DIAGNOSIS — F321 Major depressive disorder, single episode, moderate: Secondary | ICD-10-CM | POA: Diagnosis not present

## 2021-12-21 LAB — VITAMIN B12: Vitamin B-12: 343 pg/mL (ref 232–1245)

## 2021-12-21 LAB — SPECIMEN STATUS REPORT

## 2021-12-21 LAB — VITAMIN D 25 HYDROXY (VIT D DEFICIENCY, FRACTURES): Vit D, 25-Hydroxy: 81.5 ng/mL (ref 30.0–100.0)

## 2021-12-27 DIAGNOSIS — F321 Major depressive disorder, single episode, moderate: Secondary | ICD-10-CM | POA: Diagnosis not present

## 2021-12-31 ENCOUNTER — Telehealth: Payer: Self-pay | Admitting: Nurse Practitioner

## 2021-12-31 NOTE — Telephone Encounter (Signed)
Spoke with patient in office.

## 2022-01-02 ENCOUNTER — Other Ambulatory Visit: Payer: Self-pay | Admitting: Nurse Practitioner

## 2022-01-02 DIAGNOSIS — J4530 Mild persistent asthma, uncomplicated: Secondary | ICD-10-CM

## 2022-01-03 DIAGNOSIS — F321 Major depressive disorder, single episode, moderate: Secondary | ICD-10-CM | POA: Diagnosis not present

## 2022-01-07 ENCOUNTER — Ambulatory Visit: Payer: BC Managed Care – PPO | Admitting: Nurse Practitioner

## 2022-01-07 ENCOUNTER — Encounter: Payer: Self-pay | Admitting: Nurse Practitioner

## 2022-01-07 VITALS — BP 134/88 | HR 78 | Temp 98.2°F | Resp 20 | Ht 64.0 in | Wt 144.0 lb

## 2022-01-07 DIAGNOSIS — K582 Mixed irritable bowel syndrome: Secondary | ICD-10-CM | POA: Diagnosis not present

## 2022-01-07 DIAGNOSIS — F411 Generalized anxiety disorder: Secondary | ICD-10-CM

## 2022-01-07 MED ORDER — VILAZODONE HCL 20 MG PO TABS: 1.0000 | ORAL_TABLET | Freq: Every day | ORAL | 1 refills | Status: DC

## 2022-01-07 NOTE — Patient Instructions (Signed)
Diet for Irritable Bowel Syndrome °When you have irritable bowel syndrome (IBS), it is very important to eat the foods and follow the eating habits that are best for your condition. IBS may cause various symptoms such as pain in the abdomen, constipation, or diarrhea. Choosing the right foods can help to ease the discomfort from these symptoms. Work with your health care provider and diet and nutrition specialist (dietitian) to find the eating plan that will help to control your symptoms. °What are tips for following this plan? °  °Keep a food diary. This will help you identify foods that cause symptoms. Write down: °What you eat and when you eat it. °What symptoms you have. °When symptoms occur in relation to your meals, such as "pain in abdomen 2 hours after dinner." °Eat your meals slowly and in a relaxed setting. °Aim to eat 5-6 small meals per day. Do not skip meals. °Drink enough fluid to keep your urine pale yellow. °Ask your health care provider if you should take an over-the-counter probiotic to help restore healthy bacteria in your gut (digestive tract). °Probiotics are foods that contain good bacteria and yeasts. °Your dietitian may have specific dietary recommendations for you based on your symptoms. He or she may recommend that you: °Avoid foods that cause symptoms. Talk with your dietitian about other ways to get the same nutrients that are in those problem foods. °Avoid foods with gluten. Gluten is a protein that is found in rye, wheat, and barley. °Eat more foods that contain soluble fiber. Examples of foods with high soluble fiber include oats, seeds, and certain fruits and vegetables. Take a fiber supplement if directed by your dietitian. °Reduce or avoid certain foods called FODMAPs. These are foods that contain carbohydrates that are hard to digest. Ask your doctor which foods contain these carbohydrates. °What foods are not recommended? °The following are some foods and drinks that may make your  symptoms worse: °Fatty foods, such as french fries. °Foods that contain gluten, such as pasta and cereal. °Dairy products, such as milk, cheese, and ice cream. °Chocolate. °Alcohol. °Products with caffeine, such as coffee. °Carbonated drinks, such as soda. °Foods that are high in FODMAPs. These include certain fruits and vegetables. °Products with sweeteners such as honey, high fructose corn syrup, sorbitol, and mannitol. °The items listed above may not be a complete list of foods and beverages you should avoid. Contact a dietitian for more information. °What foods are good sources of fiber? °Your health care provider or dietitian may recommend that you eat more foods that contain fiber. Fiber can help to reduce constipation and other IBS symptoms. Add foods with fiber to your diet a little at a time so your body can get used to them. Too much fiber at one time might cause gas and swelling of your abdomen. The following are some foods that are good sources of fiber: °Berries, such as raspberries, strawberries, and blueberries. °Tomatoes. °Carrots. °Brown rice. °Oats. °Seeds, such as chia and pumpkin seeds. °The items listed above may not be a complete list of recommended sources of fiber. Contact your dietitian for more options. °Where to find more information °International Foundation for Functional Gastrointestinal Disorders: www.iffgd.org °National Institute of Diabetes and Digestive and Kidney Diseases: www.niddk.nih.gov °Summary °When you have irritable bowel syndrome (IBS), it is very important to eat the foods and follow the eating habits that are best for your condition. °IBS may cause various symptoms such as pain in the abdomen, constipation, or diarrhea. °Choosing the right   foods can help to ease the discomfort that comes from symptoms. °Keep a food diary. This will help you identify foods that cause symptoms. °Your health care provider or diet and nutrition specialist (dietitian) may recommend that you  eat more foods that contain fiber. °This information is not intended to replace advice given to you by your health care provider. Make sure you discuss any questions you have with your health care provider. °Document Revised: 07/05/2020 Document Reviewed: 07/12/2020 °Elsevier Patient Education © 2022 [employer] ° °

## 2022-01-07 NOTE — Progress Notes (Signed)
Subjective:    Patient ID: Christine [name], female    DOB: 1975/12/25, 46 y.o.   MRN: [id number]  Chief Complaint: Follow up anxiety (Feeling groggy in the morning. GI problems since starting Pristiq, Thick phlegm just in the mornings)   HPI Patient was seen on 12/17/20 with increasing anxiety. We gave a few xanax to take as needed while we were waiting  to the viibryd to start working. Viibryd seems to be helping since she is now on it full time. She tales it in the mornings. Just strated the 20mg  tablet 3 days ago. She also started going to the gym last week and has been walking. GAD 7 : Generalized Anxiety Score 01/07/2022 12/17/2021 08/05/2021 06/13/2021  Nervous, Anxious, on Edge 2 3 3  0  Control/stop worrying 1 3 3  0  Worry too much - different things 1 3 3  0  Trouble relaxing 1 2 2  0  Restless 0 0 0 1  Easily annoyed or irritable 1 1 3 1   Afraid - awful might happen 1 0 2 0  Total GAD 7 Score 7 12 16 2   Anxiety Difficulty Somewhat difficult Somewhat difficult Somewhat difficult Somewhat difficult     -She was on pristiq for a few weeks which caused GI problems and even though she stopped it she is still having GI problems. Says she has cramping and bloating. Switches from constipation to loose stools. She was dx with irritable bowel years ago.     Review of Systems  Constitutional:  Negative for diaphoresis.  Eyes:  Negative for pain.  Respiratory:  Negative for shortness of breath.   Cardiovascular:  Negative for chest pain, palpitations and leg swelling.  Gastrointestinal:  Negative for abdominal pain.  Endocrine: Negative for polydipsia.  Skin:  Negative for rash.  Neurological:  Negative for dizziness, weakness and headaches.  Hematological:  Does not bruise/bleed easily.  All other systems reviewed and are negative.     Objective:   Physical Exam Vitals reviewed.  Constitutional:      Appearance: Normal appearance.  Cardiovascular:     Rate and Rhythm: Normal rate  and regular rhythm.     Heart sounds: Normal heart sounds.  Pulmonary:     Breath sounds: Normal breath sounds.  Skin:    General: Skin is warm.  Neurological:     General: No focal deficit present.     Mental Status: She is alert and oriented to person, place, and time.  Psychiatric:        Mood and Affect: Mood normal.        Behavior: Behavior normal.    BP 134/88    Pulse 78    Temp 98.2 F (36.8 C) (Temporal)    Resp 20    Ht 5\' 4"  (1.626 m)    Wt 144 lb (65.3 kg)    SpO2 100%    BMI 24.72 kg/m        Assessment & Plan:  06/15/2021 in today with chief complaint of Follow up anxiety (Feeling groggy in the morning. GI problems since starting Pristiq, Thick phlegm just in the mornings)   1. Generalized anxiety disorder Stress management - Vilazodone HCl (VIIBRYD) 20 MG TABS; Take 1 tablet (20 mg total) by mouth daily.  Dispense: 90 tablet; Refill: 1  2. Irritable bowel syndrome with both constipation and diarrhea Increase fiber in diet    The above assessment and management plan was discussed with the patient. The patient verbalized  understanding of and has agreed to the management plan. Patient is aware to call the clinic if symptoms persist or worsen. Patient is aware when to return to the clinic for a follow-up visit. Patient educated on when it is appropriate to go to the emergency department.   [name], FNP

## 2022-01-10 DIAGNOSIS — F321 Major depressive disorder, single episode, moderate: Secondary | ICD-10-CM | POA: Diagnosis not present

## 2022-01-17 DIAGNOSIS — F321 Major depressive disorder, single episode, moderate: Secondary | ICD-10-CM | POA: Diagnosis not present

## 2022-01-23 DIAGNOSIS — H9042 Sensorineural hearing loss, unilateral, left ear, with unrestricted hearing on the contralateral side: Secondary | ICD-10-CM | POA: Diagnosis not present

## 2022-01-24 DIAGNOSIS — F321 Major depressive disorder, single episode, moderate: Secondary | ICD-10-CM | POA: Diagnosis not present

## 2022-01-30 ENCOUNTER — Other Ambulatory Visit: Payer: Self-pay | Admitting: Nurse Practitioner

## 2022-01-30 DIAGNOSIS — J4530 Mild persistent asthma, uncomplicated: Secondary | ICD-10-CM

## 2022-02-06 ENCOUNTER — Other Ambulatory Visit: Payer: Self-pay | Admitting: Nurse Practitioner

## 2022-02-07 DIAGNOSIS — F321 Major depressive disorder, single episode, moderate: Secondary | ICD-10-CM | POA: Diagnosis not present

## 2022-02-14 DIAGNOSIS — F321 Major depressive disorder, single episode, moderate: Secondary | ICD-10-CM | POA: Diagnosis not present

## 2022-02-21 DIAGNOSIS — F321 Major depressive disorder, single episode, moderate: Secondary | ICD-10-CM | POA: Diagnosis not present

## 2022-02-28 DIAGNOSIS — F321 Major depressive disorder, single episode, moderate: Secondary | ICD-10-CM | POA: Diagnosis not present

## 2022-03-07 DIAGNOSIS — F321 Major depressive disorder, single episode, moderate: Secondary | ICD-10-CM | POA: Diagnosis not present

## 2022-03-14 DIAGNOSIS — F321 Major depressive disorder, single episode, moderate: Secondary | ICD-10-CM | POA: Diagnosis not present

## 2022-03-21 DIAGNOSIS — F321 Major depressive disorder, single episode, moderate: Secondary | ICD-10-CM | POA: Diagnosis not present

## 2022-03-26 ENCOUNTER — Other Ambulatory Visit: Payer: Self-pay | Admitting: Nurse Practitioner

## 2022-03-26 DIAGNOSIS — J4530 Mild persistent asthma, uncomplicated: Secondary | ICD-10-CM

## 2022-03-28 DIAGNOSIS — F321 Major depressive disorder, single episode, moderate: Secondary | ICD-10-CM | POA: Diagnosis not present

## 2022-04-04 DIAGNOSIS — F321 Major depressive disorder, single episode, moderate: Secondary | ICD-10-CM | POA: Diagnosis not present

## 2022-04-07 ENCOUNTER — Other Ambulatory Visit: Payer: Self-pay | Admitting: Nurse Practitioner

## 2022-04-07 DIAGNOSIS — J4530 Mild persistent asthma, uncomplicated: Secondary | ICD-10-CM

## 2022-04-08 NOTE — Telephone Encounter (Signed)
MMM NTBS 30 days given 03/28/22 ?

## 2022-04-11 DIAGNOSIS — F321 Major depressive disorder, single episode, moderate: Secondary | ICD-10-CM | POA: Diagnosis not present

## 2022-04-18 ENCOUNTER — Ambulatory Visit: Payer: BC Managed Care – PPO | Admitting: Nurse Practitioner

## 2022-04-18 DIAGNOSIS — F321 Major depressive disorder, single episode, moderate: Secondary | ICD-10-CM | POA: Diagnosis not present

## 2022-04-22 ENCOUNTER — Ambulatory Visit: Payer: BC Managed Care – PPO | Admitting: Nurse Practitioner

## 2022-04-22 ENCOUNTER — Encounter: Payer: Self-pay | Admitting: Nurse Practitioner

## 2022-04-22 VITALS — BP 127/77 | HR 85 | Temp 97.6°F | Resp 20 | Ht 64.0 in | Wt 145.0 lb

## 2022-04-22 DIAGNOSIS — K219 Gastro-esophageal reflux disease without esophagitis: Secondary | ICD-10-CM

## 2022-04-22 DIAGNOSIS — E559 Vitamin D deficiency, unspecified: Secondary | ICD-10-CM

## 2022-04-22 DIAGNOSIS — I10 Essential (primary) hypertension: Secondary | ICD-10-CM

## 2022-04-22 DIAGNOSIS — F5101 Primary insomnia: Secondary | ICD-10-CM

## 2022-04-22 DIAGNOSIS — F411 Generalized anxiety disorder: Secondary | ICD-10-CM

## 2022-04-22 DIAGNOSIS — J4530 Mild persistent asthma, uncomplicated: Secondary | ICD-10-CM | POA: Diagnosis not present

## 2022-04-22 DIAGNOSIS — J3089 Other allergic rhinitis: Secondary | ICD-10-CM

## 2022-04-22 DIAGNOSIS — Z6832 Body mass index (BMI) 32.0-32.9, adult: Secondary | ICD-10-CM

## 2022-04-22 MED ORDER — ZOLPIDEM TARTRATE 10 MG PO TABS: 10.0000 mg | ORAL_TABLET | Freq: Every day | ORAL | 5 refills | Status: DC

## 2022-04-22 MED ORDER — HYDROCHLOROTHIAZIDE 12.5 MG PO CAPS: 12.5000 mg | ORAL_CAPSULE | Freq: Every day | ORAL | 1 refills | Status: DC

## 2022-04-22 MED ORDER — FAMOTIDINE 40 MG PO TABS
40.0000 mg | ORAL_TABLET | Freq: Two times a day (BID) | ORAL | 5 refills | Status: DC
Start: 2022-04-22 — End: 2022-10-23

## 2022-04-22 MED ORDER — SPIRIVA RESPIMAT 1.25 MCG/ACT IN AERS: INHALATION_SPRAY | RESPIRATORY_TRACT | 5 refills | Status: DC

## 2022-04-22 MED ORDER — PANTOPRAZOLE SODIUM 40 MG PO TBEC: 40.0000 mg | DELAYED_RELEASE_TABLET | Freq: Every day | ORAL | 1 refills | Status: DC

## 2022-04-22 MED ORDER — MONTELUKAST SODIUM 10 MG PO TABS: ORAL_TABLET | ORAL | 1 refills | Status: DC

## 2022-04-22 MED ORDER — PREMARIN 0.625 MG/GM VA CREA
TOPICAL_CREAM | VAGINAL | 3 refills | Status: DC
Start: 2022-04-22 — End: 2023-05-04

## 2022-04-22 MED ORDER — ALPRAZOLAM 0.25 MG PO TABS: ORAL_TABLET | ORAL | 2 refills | Status: DC

## 2022-04-22 MED ORDER — FLUTICASONE PROPIONATE HFA 110 MCG/ACT IN AERO: 2.0000 | INHALATION_SPRAY | Freq: Two times a day (BID) | RESPIRATORY_TRACT | 3 refills | Status: DC

## 2022-04-22 MED ORDER — VILAZODONE HCL 20 MG PO TABS: 1.0000 | ORAL_TABLET | Freq: Every day | ORAL | 1 refills | Status: DC

## 2022-04-22 MED ORDER — STIOLTO RESPIMAT 2.5-2.5 MCG/ACT IN AERS: 2.0000 | INHALATION_SPRAY | Freq: Two times a day (BID) | RESPIRATORY_TRACT | 5 refills | Status: DC

## 2022-04-22 NOTE — Patient Instructions (Signed)

## 2022-04-22 NOTE — Progress Notes (Signed)
Subjective:    Patient ID: Christine [name], female    DOB: 03/09/1976, 46 y.o.   MRN: [id number]   Chief Complaint: medical management of chronic issues     HPI:  Christine [name] is a 46 y.o. who identifies as a female who was assigned female at birth.   Social history: Lives with: husband and children Work history: works from home   Comes in today for follow up of the following chronic medical issues:  1. Primary hypertension No c/o chest pain, sob or headache. She checks her blood pressure occasionally at home. It is always high when she is at the doctors office. BP Readings from Last 3 Encounters:  01/07/22 134/88  12/17/21 130/89  09/05/21 136/88     2. Gastroesophageal reflux disease, unspecified whether esophagitis present Is on protonix  and pepcid daily and is doing well  3. Allergic rhinitis Is on fluticasone nasal spray. Is not doing so wel. She has added zyrtec.  4. Mild persistent asthma, uncomplicated Is on stiolto , singulair, spirivia and flovent. She has not needed her albuterol as of late.  5. Primary insomnia Is on ambien nightly and is sleeping well. Sleeps about 7-8 hours a night  6. Generalized anxiety disorder Stays anxious. Worries about everything. Is on combination of viibyrd and xanax. Without meds she can hardly function.    04/22/2022    2:31 PM 01/07/2022    8:54 AM 12/17/2021   10:18 AM 08/05/2021   11:42 AM  GAD 7 : Generalized Anxiety Score  Nervous, Anxious, on Edge 1 2 3 3   Control/stop worrying 1 1 3 3   Worry too much - different things 2 1 3 3   Trouble relaxing 1 1 2 2   Restless 1 0 0 0  Easily annoyed or irritable 2 1 1 3   Afraid - awful might happen 1 1 0 2  Total GAD 7 Score 9 7 12 16   Anxiety Difficulty Somewhat difficult Somewhat difficult Somewhat difficult Somewhat difficult      7. Vitamin D deficiency Is on daily vitamin d supplement  8. BMI 32.0-32.9,adult No recent weight changes Wt Readings from Last 3  Encounters:  04/22/22 145 lb (65.8 kg)  01/07/22 144 lb (65.3 kg)  12/17/21 144 lb (65.3 kg)   BP Readings from Last 3 Encounters:  04/22/22 127/77  01/07/22 134/88  12/17/21 130/89      New complaints: none  Allergies  Allergen Reactions   Sulfa Antibiotics Hives   Sulfasalazine Hives   Doxycycline Hyclate    Ivp Dye [Iodinated Contrast Media] Hives   Other     ALPHA GAL - MEAT ALLERGY    Paxil [Paroxetine Hcl] Other (See Comments)    Made pt very angry    Phosphorus Diarrhea   Outpatient Encounter Medications as of 04/22/2022  Medication Sig   albuterol (PROVENTIL) (2.5 MG/3ML) 0.083% nebulizer solution Take 3 mLs (2.5 mg total) by nebulization every 4 (four) hours as needed for wheezing or shortness of breath.   ALPRAZolam (XANAX) 0.25 MG tablet 1 po daily prn   Cholecalciferol (VITAMIN D3) 1000 units CAPS Take by mouth.   conjugated estrogens (PREMARIN) vaginal cream Premarin 0.625 mg/gram vaginal cream  INSERT 0.5 APPLICATORSFUL EVERY DAY BY VAGINAL ROUTE FOR 14 DAYS.   ethynodiol-ethinyl estradiol (KELNOR 1/35) 1-35 MG-MCG tablet Take 1 tablet by mouth daily. (NEEDS TO BE SEEN BEFORE NEXT REFILL for PE)   famotidine (PEPCID) 40 MG tablet Take 1 tablet (40 mg total)  by mouth in the morning and at bedtime.   fluticasone (FLOVENT HFA) 110 MCG/ACT inhaler Inhale 2 puffs into the lungs 2 (two) times daily.   Fluticasone Propionate (XHANCE) 93 MCG/ACT EXHU Place 2 sprays into the nose 2 (two) times daily.   hydrochlorothiazide (MICROZIDE) 12.5 MG capsule Take 1 capsule (12.5 mg total) by mouth daily.   hydrocortisone (ANUSOL-HC) 25 MG suppository Place 1 suppository (25 mg total) rectally 2 (two) times daily.   Hydrocortisone Acetate 1 % OINT Use prn twice daily   montelukast (SINGULAIR) 10 MG tablet TAKE 1 TABLET BY MOUTH EVERYDAY AT BEDTIME   pantoprazole (PROTONIX) 40 MG tablet Take 1 tablet (40 mg total) by mouth daily. (Needs to be seen before next refill)   SPIRIVA  RESPIMAT 1.25 MCG/ACT AERS INHALE 1 PUFF BY MOUTH INTO THE LUNGS EVERY DAY   STIOLTO RESPIMAT 2.5-2.5 MCG/ACT AERS INHALE 2 PUFFS INTO THE LUNGS IN THE MORNING AND AT BEDTIME.   UNABLE TO FIND    VENTOLIN HFA 108 (90 Base) MCG/ACT inhaler TAKE 2 PUFFS BY MOUTH EVERY 6 HOURS AS NEEDED FOR WHEEZE OR SHORTNESS OF BREATH   Vilazodone HCl (VIIBRYD) 20 MG TABS Take 1 tablet (20 mg total) by mouth daily.   zolpidem (AMBIEN) 10 MG tablet Take 1 tablet (10 mg total) by mouth at bedtime.   No facility-administered encounter medications on file as of 04/22/2022.    Past Surgical History:  Procedure Laterality Date   BREAST BIOPSY Right 07/2015   fibrocystic changes   CESAREAN SECTION     TONSILLECTOMY AND ADENOIDECTOMY      Family History  Problem Relation Age of Onset   Diabetes Mother    Hypertension Mother    Hyperlipidemia Father    Hypertension Father    Allergic rhinitis Neg Hx    Angioedema Neg Hx    Asthma Neg Hx    Eczema Neg Hx    Immunodeficiency Neg Hx    Urticaria Neg Hx       Controlled substance contract: 06/14/21     Review of Systems  Constitutional:  Negative for diaphoresis.  Eyes:  Negative for pain.  Respiratory:  Negative for shortness of breath.   Cardiovascular:  Negative for chest pain, palpitations and leg swelling.  Gastrointestinal:  Negative for abdominal pain.  Endocrine: Negative for polydipsia.  Skin:  Negative for rash.  Neurological:  Negative for dizziness, weakness and headaches.  Hematological:  Does not bruise/bleed easily.  All other systems reviewed and are negative.     Objective:   Physical Exam Vitals and nursing note reviewed.  Constitutional:      General: She is not in acute distress.    Appearance: Normal appearance. She is well-developed.  HENT:     Head: Normocephalic.     Right Ear: Tympanic membrane normal.     Left Ear: Tympanic membrane normal.     Nose: Nose normal.     Mouth/Throat:     Mouth: Mucous membranes  are moist.  Eyes:     Pupils: Pupils are equal, round, and reactive to light.  Neck:     Vascular: No carotid bruit or JVD.  Cardiovascular:     Rate and Rhythm: Normal rate and regular rhythm.     Heart sounds: Normal heart sounds.  Pulmonary:     Effort: Pulmonary effort is normal. No respiratory distress.     Breath sounds: Normal breath sounds. No wheezing or rales.  Chest:     Chest  wall: No tenderness.  Abdominal:     General: Bowel sounds are normal. There is no distension or abdominal bruit.     Palpations: Abdomen is soft. There is no hepatomegaly, splenomegaly, mass or pulsatile mass.     Tenderness: There is no abdominal tenderness.  Musculoskeletal:        General: Normal range of motion.     Cervical back: Normal range of motion and neck supple.  Lymphadenopathy:     Cervical: No cervical adenopathy.  Skin:    General: Skin is warm and dry.  Neurological:     Mental Status: She is alert and oriented to person, place, and time.     Deep Tendon Reflexes: Reflexes are normal and symmetric.  Psychiatric:        Behavior: Behavior normal.        Thought Content: Thought content normal.        Judgment: Judgment normal.    BP 127/77   Pulse 85   Temp 97.6 F (36.4 C) (Temporal)   Resp 20   Ht 5\' 4"  (1.626 m)   Wt 145 lb (65.8 kg)   SpO2 99%   BMI 24.89 kg/m        Assessment & Plan:   IYANAH DEMONT comes in today with chief complaint of Medical Management of Chronic Issues (Menstrual cramps very painful the past few months)   Diagnosis and orders addressed:  1. Primary hypertension Low sodium diet - hydrochlorothiazide (MICROZIDE) 12.5 MG capsule; Take 1 capsule (12.5 mg total) by mouth daily.  Dispense: 90 capsule; Refill: 1  2. Gastroesophageal reflux disease, unspecified whether esophagitis present Avoid spicy foods Do not eat 2 hours prior to bedtime - pantoprazole (PROTONIX) 40 MG tablet; Take 1 tablet (40 mg total) by mouth daily. (Needs to  be seen before next refill)  Dispense: 90 tablet; Refill: 1 - famotidine (PEPCID) 40 MG tablet; Take 1 tablet (40 mg total) by mouth in the morning and at bedtime.  Dispense: 60 tablet; Refill: 5  3. Allergic rhinitis Continue nasal spray and zyrtec daily  4. Mild persistent asthma, uncomplicated - Tiotropium Bromide Monohydrate (SPIRIVA RESPIMAT) 1.25 MCG/ACT AERS; INHALE 1 PUFF BY MOUTH INTO THE LUNGS EVERY DAY  Dispense: 4 each; Refill: 5 - fluticasone (FLOVENT HFA) 110 MCG/ACT inhaler; Inhale 2 puffs into the lungs 2 (two) times daily.  Dispense: 2 each; Refill: 3 - montelukast (SINGULAIR) 10 MG tablet; TAKE 1 TABLET BY MOUTH EVERYDAY AT BEDTIME  Dispense: 90 tablet; Refill: 1 - Tiotropium Bromide-Olodaterol (STIOLTO RESPIMAT) 2.5-2.5 MCG/ACT AERS; Inhale 2 puffs into the lungs in the morning and at bedtime.  Dispense: 4 g; Refill: 5  5. Primary insomnia Bedtime routine - zolpidem (AMBIEN) 10 MG tablet; Take 1 tablet (10 mg total) by mouth at bedtime.  Dispense: 30 tablet; Refill: 5  6. Generalized anxiety disorder Stress management - Vilazodone HCl (VIIBRYD) 20 MG TABS; Take 1 tablet (20 mg total) by mouth daily.  Dispense: 90 tablet; Refill: 1 - ALPRAZolam (XANAX) 0.25 MG tablet; 1 po daily prn  Dispense: 20 tablet; Refill: 2  7. Vitamin D deficiency Continue daily vitamin d supplement  8. BMI 32.0-32.9,adult Discussed diet and exercise for person with BMI >25 Will recheck weight in 3-6 months    Labs pending Health Maintenance reviewed Diet and exercise encouraged  Follow up plan: 6 months   Mary-Margaret 04-15-1995, FNP

## 2022-04-22 NOTE — Addendum Note (Signed)
Addended by: Cleda Daub on: 04/22/2022 03:01 PM   Modules accepted: Orders

## 2022-04-22 NOTE — Addendum Note (Signed)
Addended by: Chevis Pretty on: 04/22/2022 02:55 PM   Modules accepted: Orders

## 2022-04-23 LAB — CMP14+EGFR
ALT: 9 IU/L (ref 0–32)
AST: 15 IU/L (ref 0–40)
Albumin/Globulin Ratio: 1.5 (ref 1.2–2.2)
Albumin: 4 g/dL (ref 3.8–4.8)
Alkaline Phosphatase: 50 IU/L (ref 44–121)
BUN/Creatinine Ratio: 17 (ref 9–23)
BUN: 13 mg/dL (ref 6–24)
Bilirubin Total: 0.2 mg/dL (ref 0.0–1.2)
CO2: 22 mmol/L (ref 20–29)
Calcium: 9.1 mg/dL (ref 8.7–10.2)
Chloride: 102 mmol/L (ref 96–106)
Creatinine, Ser: 0.75 mg/dL (ref 0.57–1.00)
Globulin, Total: 2.6 g/dL (ref 1.5–4.5)
Glucose: 97 mg/dL (ref 70–99)
Potassium: 4.3 mmol/L (ref 3.5–5.2)
Sodium: 137 mmol/L (ref 134–144)
Total Protein: 6.6 g/dL (ref 6.0–8.5)
eGFR: 99 mL/min/{1.73_m2} (ref >59)

## 2022-04-23 LAB — CBC WITH DIFFERENTIAL/PLATELET
Basophils Absolute: 0 10*3/uL (ref 0.0–0.2)
Basos: 1 %
EOS (ABSOLUTE): 0.1 10*3/uL (ref 0.0–0.4)
Eos: 1 %
Hematocrit: 38 % (ref 34.0–46.6)
Hemoglobin: 12.9 g/dL (ref 11.1–15.9)
Immature Grans (Abs): 0 10*3/uL (ref 0.0–0.1)
Immature Granulocytes: 0 %
Lymphocytes Absolute: 2.3 10*3/uL (ref 0.7–3.1)
Lymphs: 39 %
MCH: 30 pg (ref 26.6–33.0)
MCHC: 33.9 g/dL (ref 31.5–35.7)
MCV: 88 fL (ref 79–97)
Monocytes Absolute: 0.3 10*3/uL (ref 0.1–0.9)
Monocytes: 5 %
Neutrophils Absolute: 3.2 10*3/uL (ref 1.4–7.0)
Neutrophils: 54 %
Platelets: 264 10*3/uL (ref 150–450)
RBC: 4.3 x10E6/uL (ref 3.77–5.28)
RDW: 12.8 % (ref 11.7–15.4)
WBC: 5.9 10*3/uL (ref 3.4–10.8)

## 2022-04-23 LAB — LIPID PANEL
Chol/HDL Ratio: 4.2 ratio (ref 0.0–4.4)
Cholesterol, Total: 258 mg/dL — ABNORMAL HIGH (ref 100–199)
HDL: 62 mg/dL (ref >39)
LDL Chol Calc (NIH): 175 mg/dL — ABNORMAL HIGH (ref 0–99)
Triglycerides: 119 mg/dL (ref 0–149)
VLDL Cholesterol Cal: 21 mg/dL (ref 5–40)

## 2022-04-25 DIAGNOSIS — F321 Major depressive disorder, single episode, moderate: Secondary | ICD-10-CM | POA: Diagnosis not present

## 2022-05-02 DIAGNOSIS — F321 Major depressive disorder, single episode, moderate: Secondary | ICD-10-CM | POA: Diagnosis not present

## 2022-05-05 ENCOUNTER — Telehealth: Payer: Self-pay | Admitting: Nurse Practitioner

## 2022-05-05 ENCOUNTER — Other Ambulatory Visit: Payer: Self-pay | Admitting: Nurse Practitioner

## 2022-05-05 DIAGNOSIS — J4530 Mild persistent asthma, uncomplicated: Secondary | ICD-10-CM

## 2022-05-05 MED ORDER — ETHYNODIOL DIAC-ETH ESTRADIOL 1-35 MG-MCG PO TABS: 1.0000 | ORAL_TABLET | Freq: Every day | ORAL | 3 refills | Status: DC

## 2022-05-05 NOTE — Telephone Encounter (Signed)
Sent RF request from RF pool, waiting on response. Pt's resent OV was 6 mos chronic FU. Last PE OV was 12/03/20

## 2022-05-05 NOTE — Telephone Encounter (Signed)
Christine [name] refilled for 1 mos, double checked w/ her nurse,  provider said she does not need a PE yet and ok to refill for one year. RF sent, pt aware

## 2022-05-09 DIAGNOSIS — F321 Major depressive disorder, single episode, moderate: Secondary | ICD-10-CM | POA: Diagnosis not present

## 2022-05-16 DIAGNOSIS — F321 Major depressive disorder, single episode, moderate: Secondary | ICD-10-CM | POA: Diagnosis not present

## 2022-05-23 DIAGNOSIS — F321 Major depressive disorder, single episode, moderate: Secondary | ICD-10-CM | POA: Diagnosis not present

## 2022-05-29 MED ORDER — VENTOLIN HFA 108 (90 BASE) MCG/ACT IN AERS
INHALATION_SPRAY | RESPIRATORY_TRACT | 2 refills | Status: DC
Start: 2022-05-29 — End: 2023-06-01

## 2022-05-30 DIAGNOSIS — F321 Major depressive disorder, single episode, moderate: Secondary | ICD-10-CM | POA: Diagnosis not present

## 2022-06-06 DIAGNOSIS — F321 Major depressive disorder, single episode, moderate: Secondary | ICD-10-CM | POA: Diagnosis not present

## 2022-06-13 DIAGNOSIS — F321 Major depressive disorder, single episode, moderate: Secondary | ICD-10-CM | POA: Diagnosis not present

## 2022-06-20 DIAGNOSIS — F321 Major depressive disorder, single episode, moderate: Secondary | ICD-10-CM | POA: Diagnosis not present

## 2022-06-27 DIAGNOSIS — F321 Major depressive disorder, single episode, moderate: Secondary | ICD-10-CM | POA: Diagnosis not present

## 2022-07-04 DIAGNOSIS — F321 Major depressive disorder, single episode, moderate: Secondary | ICD-10-CM | POA: Diagnosis not present

## 2022-07-11 DIAGNOSIS — F321 Major depressive disorder, single episode, moderate: Secondary | ICD-10-CM | POA: Diagnosis not present

## 2022-07-21 DIAGNOSIS — F321 Major depressive disorder, single episode, moderate: Secondary | ICD-10-CM | POA: Diagnosis not present

## 2022-07-29 DIAGNOSIS — F321 Major depressive disorder, single episode, moderate: Secondary | ICD-10-CM | POA: Diagnosis not present

## 2022-08-07 DIAGNOSIS — F321 Major depressive disorder, single episode, moderate: Secondary | ICD-10-CM | POA: Diagnosis not present

## 2022-08-12 DIAGNOSIS — F321 Major depressive disorder, single episode, moderate: Secondary | ICD-10-CM | POA: Diagnosis not present

## 2022-08-25 DIAGNOSIS — F321 Major depressive disorder, single episode, moderate: Secondary | ICD-10-CM | POA: Diagnosis not present

## 2022-09-15 ENCOUNTER — Encounter: Payer: Self-pay | Admitting: Nurse Practitioner

## 2022-09-15 ENCOUNTER — Ambulatory Visit: Payer: BC Managed Care – PPO | Admitting: Nurse Practitioner

## 2022-09-15 VITALS — Temp 98.7°F | Ht 64.0 in | Wt 147.0 lb

## 2022-09-15 DIAGNOSIS — N898 Other specified noninflammatory disorders of vagina: Secondary | ICD-10-CM

## 2022-09-15 DIAGNOSIS — B3731 Acute candidiasis of vulva and vagina: Secondary | ICD-10-CM | POA: Diagnosis not present

## 2022-09-15 DIAGNOSIS — R300 Dysuria: Secondary | ICD-10-CM | POA: Diagnosis not present

## 2022-09-15 LAB — URINALYSIS
Bilirubin, UA: NEGATIVE
Glucose, UA: NEGATIVE
Ketones, UA: NEGATIVE
Nitrite, UA: NEGATIVE
Protein,UA: NEGATIVE
Specific Gravity, UA: 1.02 (ref 1.005–1.030)
Urobilinogen, Ur: 0.2 mg/dL (ref 0.2–1.0)
pH, UA: 6.5 (ref 5.0–7.5)

## 2022-09-15 LAB — WET PREP FOR TRICH, YEAST, CLUE
Clue Cell Exam: NEGATIVE
Trichomonas Exam: NEGATIVE
Yeast Exam: POSITIVE — AB

## 2022-09-15 MED ORDER — PHENAZOPYRIDINE HCL 95 MG PO TABS: 95.0000 mg | ORAL_TABLET | Freq: Three times a day (TID) | ORAL | 0 refills | Status: DC | PRN

## 2022-09-15 MED ORDER — AMOXICILLIN-POT CLAVULANATE 875-125 MG PO TABS: 1.0000 | ORAL_TABLET | Freq: Two times a day (BID) | ORAL | 0 refills | Status: DC

## 2022-09-15 MED ORDER — FLUCONAZOLE 150 MG PO TABS: 150.0000 mg | ORAL_TABLET | Freq: Once | ORAL | 0 refills | Status: AC

## 2022-09-15 NOTE — Patient Instructions (Signed)
Dysuria Dysuria is pain or discomfort during urination. The pain or discomfort may be felt in the part of the body that drains urine from the bladder (urethra) or in the surrounding tissue of the genitals. The pain may also be felt in the groin area, lower abdomen, or lower back. You may have to urinate frequently or have the sudden feeling that you have to urinate (urgency). Dysuria can affect anyone, but it is more common in females. Dysuria can be caused by many different things, including: Urinary tract infection. Kidney stones or bladder stones. Certain STIs (sexually transmitted infections), such as chlamydia. Dehydration. Inflammation of the tissues of the vagina. Use of certain medicines. Use of certain soaps or scented products that cause irritation. Follow these instructions at home: Medicines Take over-the-counter and prescription medicines only as told by your health care provider. If you were prescribed an antibiotic medicine, take it as told by your health care provider. Do not stop taking the antibiotic even if you start to feel better. Eating and drinking  Drink enough fluid to keep your urine pale yellow. Avoid caffeinated beverages, tea, and alcohol. These beverages can irritate the bladder and make dysuria worse. In males, alcohol may irritate the prostate. General instructions Watch your condition for any changes. Urinate often. Avoid holding urine for long periods of time. If you are female, you should wipe from front to back after urinating or having a bowel movement. Use each piece of toilet paper only once. Empty your bladder after sex. Keep all follow-up visits. This is important. If you had any tests done to find the cause of dysuria, it is up to you to get your test results. Ask your health care provider, or the department that is doing the test, when your results will be ready. Contact a health care provider if: You have a fever. You develop pain in your back or  sides. You have nausea or vomiting. You have blood in your urine. You are not urinating as often as you usually do. Get help right away if: Your pain is severe and not relieved with medicines. You cannot eat or drink without vomiting. You are confused. You have a rapid heartbeat while resting. You have shaking or chills. You feel extremely weak. Summary Dysuria is pain or discomfort while urinating. Many different conditions can lead to dysuria. If you have dysuria, you may have to urinate frequently or have the sudden feeling that you have to urinate (urgency). Watch your condition for any changes. Keep all follow-up visits. Make sure that you urinate often and drink enough fluid to keep your urine pale yellow. This information is not intended to replace advice given to you by your health care provider. Make sure you discuss any questions you have with your health care provider. Document Revised: 06/22/2020 Document Reviewed: 06/22/2020 Elsevier Patient Education  [place]. Vaginal Yeast Infection, Adult  Vaginal yeast infection is a condition that causes vaginal discharge as well as soreness, swelling, and redness (inflammation) of the vagina. This is a common condition. Some women get this infection frequently. What are the causes? This condition is caused by a change in the normal balance of the yeast (Candida) and normal bacteria that live in the vagina. This change causes an overgrowth of yeast, which causes the inflammation. What increases the risk? The condition is more likely to develop in women who: Take antibiotic medicines. Have diabetes. Take birth control pills. Are pregnant. Douche often. Have a weak body defense system (immune  system). Have been taking steroid medicines for a long time. Frequently wear tight clothing. What are the signs or symptoms? Symptoms of this condition include: White, thick, creamy vaginal discharge. Swelling, itching, redness,  and irritation of the vagina. The lips of the vagina (labia) may be affected as well. Pain or a burning feeling while urinating. Pain during sex. How is this diagnosed? This condition is diagnosed based on: Your medical history. A physical exam. A pelvic exam. Your health care provider will examine a sample of your vaginal discharge under a microscope. Your health care provider may send this sample for testing to confirm the diagnosis. How is this treated? This condition is treated with medicine. Medicines may be over-the-counter or prescription. You may be told to use one or more of the following: Medicine that is taken by mouth (orally). Medicine that is applied as a cream (topically). Medicine that is inserted directly into the vagina (suppository). Follow these instructions at home: Take or apply over-the-counter and prescription medicines only as told by your health care provider. Do not use tampons until your health care provider approves. Do not have sex until your infection has cleared. Sex can prolong or worsen your symptoms of infection. Ask your health care provider when it is safe to resume sexual activity. Keep all follow-up visits. This is important. How is this prevented?  Do not wear tight clothes, such as pantyhose or tight pants. Wear breathable cotton underwear. Do not use douches, perfumed soap, creams, or powders. Wipe from front to back after using the toilet. If you have diabetes, keep your blood sugar levels under control. Ask your health care provider for other ways to prevent yeast infections. Contact a health care provider if: You have a fever. Your symptoms go away and then return. Your symptoms do not get better with treatment. Your symptoms get worse. You have new symptoms. You develop blisters in or around your vagina. You have blood coming from your vagina and it is not your menstrual period. You develop pain in your abdomen. Summary Vaginal yeast  infection is a condition that causes discharge as well as soreness, swelling, and redness (inflammation) of the vagina. This condition is treated with medicine. Medicines may be over-the-counter or prescription. Take or apply over-the-counter and prescription medicines only as told by your health care provider. Do not douche. Resume sexual activity or use of tampons as instructed by your health care provider. Contact a health care provider if your symptoms do not get better with treatment or your symptoms go away and then return. This information is not intended to replace advice given to you by your health care provider. Make sure you discuss any questions you have with your health care provider. Document Revised: 01/28/2021 Document Reviewed: 01/28/2021 Elsevier Patient Education  2023 [employer].

## 2022-09-15 NOTE — Progress Notes (Signed)
Acute Office Visit  Subjective:     Patient ID: Christine [name], female    DOB: Apr 10, 1976, 46 y.o.   MRN: [id number]  Chief Complaint  Patient presents with   Vaginal Itching    Started about Thursday afternoon   Vaginal Discharge    White chunky     Vaginal Itching The patient's primary symptoms include vaginal discharge. The patient's pertinent negatives include no genital lesions, genital rash, missed menses or pelvic pain. This is a new problem. The current episode started in the past 7 days. The problem occurs constantly. The problem has been unchanged. The problem affects both sides. She is not pregnant. Associated symptoms include dysuria and flank pain. Pertinent negatives include no abdominal pain, constipation, fever, headaches or hematuria. The vaginal discharge was copious and white. There has been no bleeding. She has not been passing clots. She has not been passing tissue. Nothing aggravates the symptoms. Treatments tried: Monistat OTC. Improvement on treatment: Worse. She is not sexually active. It is unknown whether or not her partner has an STD.  Vaginal Discharge The patient's primary symptoms include vaginal discharge. The patient's pertinent negatives include no genital lesions, genital rash, missed menses or pelvic pain. The problem occurs constantly. The pain is moderate. Associated symptoms include dysuria and flank pain. Pertinent negatives include no abdominal pain, constipation, fever, headaches or hematuria. The vaginal discharge was white. There has been no bleeding. She has not been passing clots. She has not been passing tissue. Nothing aggravates the symptoms. She is sexually active. It is unknown whether or not her partner has an STD.     Review of Systems  Constitutional:  Negative for fever.  HENT: Negative.    Respiratory: Negative.    Cardiovascular: Negative.   Gastrointestinal:  Negative for abdominal pain and constipation.  Genitourinary:  Positive  for dysuria, flank pain and vaginal discharge. Negative for hematuria, missed menses and pelvic pain.  Skin: Negative.   Neurological:  Negative for headaches.        Objective:    Temp 98.7 F (37.1 C)   Ht 5\' 4"  (1.626 m)   Wt 147 lb (66.7 kg)   LMP 08/18/2022 (Approximate)   BMI 25.23 kg/m  BP Readings from Last 3 Encounters:  04/22/22 127/77  01/07/22 134/88  12/17/21 130/89   Wt Readings from Last 3 Encounters:  09/15/22 147 lb (66.7 kg)  04/22/22 145 lb (65.8 kg)  01/07/22 144 lb (65.3 kg)      Physical Exam Vitals reviewed.  Constitutional:      Appearance: Normal appearance.  HENT:     Head: Normocephalic.     Right Ear: External ear normal.     Left Ear: External ear normal.     Nose: Nose normal.  Cardiovascular:     Rate and Rhythm: Normal rate and regular rhythm.     Pulses: Normal pulses.     Heart sounds: Normal heart sounds.  Pulmonary:     Effort: Pulmonary effort is normal.     Breath sounds: Normal breath sounds.  Abdominal:     General: Bowel sounds are normal.  Genitourinary:    Vagina: Vaginal discharge present.     Rectum: Normal.       Comments: Red, tender and irritated.  Skin:    General: Skin is warm.     Findings: No erythema or rash.  Neurological:     General: No focal deficit present.     Mental Status: She is  alert and oriented to person, place, and time.     No results found for any visits on 09/15/22.      Assessment & Plan:  Patient presents with dysuria symptoms, denies fever,  UTI  vs  interstitial cystitis -urinalysis completed results pending -pyridium for pain -Augmentin for positive Leukocytes,  Precaution and education provided -All questions answered  Fluconazole 150 mg once for positive yeast on wet prep  -follow up with unresolved symptoms    Problem List Items Addressed This Visit   None Visit Diagnoses     Vaginal itching    -  Primary   Relevant Orders   WET PREP FOR [place], YEAST, CLUE    Urinalysis   Vaginal yeast infection       Relevant Medications   fluconazole (DIFLUCAN) 150 MG tablet   Dysuria       Relevant Medications   amoxicillin-clavulanate (AUGMENTIN) 875-125 MG tablet   phenazopyridine (PYRIDIUM) 95 MG tablet       Meds ordered this encounter  Medications   fluconazole (DIFLUCAN) 150 MG tablet    Sig: Take 1 tablet (150 mg total) by mouth once for 1 dose.    Dispense:  1 tablet    Refill:  0    Order Specific Question:   Supervising Provider    Answer:   Claretta Fraise [[id number]]   amoxicillin-clavulanate (AUGMENTIN) 875-125 MG tablet    Sig: Take 1 tablet by mouth 2 (two) times daily.    Dispense:  14 tablet    Refill:  0    Order Specific Question:   Supervising Provider    Answer:   Jeneen Rinks   phenazopyridine (PYRIDIUM) 95 MG tablet    Sig: Take 1 tablet (95 mg total) by mouth 3 (three) times daily as needed for pain.    Dispense:  10 tablet    Refill:  0    Order Specific Question:   Supervising Provider    Answer:   Claretta Fraise [phone]    Return if symptoms worsen or fail to improve.  Ivy Lynn, NP

## 2022-09-16 ENCOUNTER — Other Ambulatory Visit: Payer: Self-pay | Admitting: Nurse Practitioner

## 2022-09-16 DIAGNOSIS — R300 Dysuria: Secondary | ICD-10-CM

## 2022-09-16 NOTE — Telephone Encounter (Signed)
I started you on Augmentin yesterday for UTI symptoms (positive Leukocytes in your urinalysis) I gave you 150 mg tablet of diflucan for yeast infection yesterday. Were you able to pick up medication from Pharmacy?   Your urine cultures will return in 3-5 days. I saw you less than 24 hours ago. Please be patient.

## 2022-09-22 ENCOUNTER — Other Ambulatory Visit: Payer: Self-pay

## 2022-09-22 ENCOUNTER — Other Ambulatory Visit: Payer: BC Managed Care – PPO

## 2022-09-22 ENCOUNTER — Other Ambulatory Visit: Payer: Self-pay | Admitting: Nurse Practitioner

## 2022-09-22 DIAGNOSIS — N898 Other specified noninflammatory disorders of vagina: Secondary | ICD-10-CM | POA: Diagnosis not present

## 2022-09-22 DIAGNOSIS — R300 Dysuria: Secondary | ICD-10-CM

## 2022-09-22 NOTE — Telephone Encounter (Signed)
Absolutely she can come by and repeat labs, orders placed. thanks

## 2022-09-23 ENCOUNTER — Other Ambulatory Visit: Payer: Self-pay | Admitting: Nurse Practitioner

## 2022-09-23 DIAGNOSIS — R8290 Unspecified abnormal findings in urine: Secondary | ICD-10-CM

## 2022-09-23 DIAGNOSIS — N898 Other specified noninflammatory disorders of vagina: Secondary | ICD-10-CM | POA: Diagnosis not present

## 2022-09-23 DIAGNOSIS — F321 Major depressive disorder, single episode, moderate: Secondary | ICD-10-CM | POA: Diagnosis not present

## 2022-09-23 LAB — URINALYSIS
Bilirubin, UA: NEGATIVE
Glucose, UA: NEGATIVE
Ketones, UA: NEGATIVE
Leukocytes,UA: NEGATIVE
Nitrite, UA: NEGATIVE
Protein,UA: NEGATIVE
Specific Gravity, UA: 1.02 (ref 1.005–1.030)
Urobilinogen, Ur: 0.2 mg/dL (ref 0.2–1.0)
pH, UA: 7 (ref 5.0–7.5)

## 2022-09-23 LAB — WET PREP FOR TRICH, YEAST, CLUE
Clue Cell Exam: NEGATIVE
Trichomonas Exam: NEGATIVE
Yeast Exam: NEGATIVE

## 2022-09-23 MED ORDER — NITROFURANTOIN MONOHYD MACRO 100 MG PO CAPS: 100.0000 mg | ORAL_CAPSULE | Freq: Two times a day (BID) | ORAL | 0 refills | Status: DC

## 2022-09-24 LAB — URINE CULTURE

## 2022-09-25 ENCOUNTER — Other Ambulatory Visit: Payer: Self-pay | Admitting: Nurse Practitioner

## 2022-09-25 MED ORDER — FLUCONAZOLE 150 MG PO TABS: ORAL_TABLET | ORAL | 0 refills | Status: DC

## 2022-10-06 DIAGNOSIS — F321 Major depressive disorder, single episode, moderate: Secondary | ICD-10-CM | POA: Diagnosis not present

## 2022-10-19 ENCOUNTER — Other Ambulatory Visit: Payer: Self-pay | Admitting: Nurse Practitioner

## 2022-10-19 DIAGNOSIS — K219 Gastro-esophageal reflux disease without esophagitis: Secondary | ICD-10-CM

## 2022-10-23 ENCOUNTER — Encounter: Payer: Self-pay | Admitting: Nurse Practitioner

## 2022-10-23 ENCOUNTER — Ambulatory Visit: Payer: BC Managed Care – PPO | Admitting: Nurse Practitioner

## 2022-10-23 VITALS — BP 140/85 | HR 73 | Temp 98.0°F | Resp 20 | Ht 64.0 in | Wt 150.0 lb

## 2022-10-23 DIAGNOSIS — R030 Elevated blood-pressure reading, without diagnosis of hypertension: Secondary | ICD-10-CM

## 2022-10-23 DIAGNOSIS — K219 Gastro-esophageal reflux disease without esophagitis: Secondary | ICD-10-CM

## 2022-10-23 DIAGNOSIS — I10 Essential (primary) hypertension: Secondary | ICD-10-CM | POA: Diagnosis not present

## 2022-10-23 DIAGNOSIS — N926 Irregular menstruation, unspecified: Secondary | ICD-10-CM | POA: Diagnosis not present

## 2022-10-23 DIAGNOSIS — J4530 Mild persistent asthma, uncomplicated: Secondary | ICD-10-CM

## 2022-10-23 DIAGNOSIS — F5101 Primary insomnia: Secondary | ICD-10-CM

## 2022-10-23 DIAGNOSIS — E559 Vitamin D deficiency, unspecified: Secondary | ICD-10-CM

## 2022-10-23 DIAGNOSIS — Z23 Encounter for immunization: Secondary | ICD-10-CM

## 2022-10-23 DIAGNOSIS — Z6832 Body mass index (BMI) 32.0-32.9, adult: Secondary | ICD-10-CM

## 2022-10-23 DIAGNOSIS — F411 Generalized anxiety disorder: Secondary | ICD-10-CM | POA: Diagnosis not present

## 2022-10-23 MED ORDER — FAMOTIDINE 40 MG PO TABS: 40.0000 mg | ORAL_TABLET | Freq: Two times a day (BID) | ORAL | 5 refills | Status: DC

## 2022-10-23 MED ORDER — HYDROCHLOROTHIAZIDE 12.5 MG PO CAPS: 12.5000 mg | ORAL_CAPSULE | Freq: Every day | ORAL | 1 refills | Status: DC

## 2022-10-23 MED ORDER — STIOLTO RESPIMAT 2.5-2.5 MCG/ACT IN AERS: 2.0000 | INHALATION_SPRAY | Freq: Two times a day (BID) | RESPIRATORY_TRACT | 5 refills | Status: DC

## 2022-10-23 MED ORDER — MONTELUKAST SODIUM 10 MG PO TABS: ORAL_TABLET | ORAL | 1 refills | Status: DC

## 2022-10-23 MED ORDER — PANTOPRAZOLE SODIUM 40 MG PO TBEC: 40.0000 mg | DELAYED_RELEASE_TABLET | Freq: Every day | ORAL | 1 refills | Status: DC

## 2022-10-23 MED ORDER — ALPRAZOLAM 0.25 MG PO TABS: ORAL_TABLET | ORAL | 2 refills | Status: DC

## 2022-10-23 MED ORDER — VILAZODONE HCL 20 MG PO TABS: 1.0000 | ORAL_TABLET | Freq: Every day | ORAL | 1 refills | Status: DC

## 2022-10-23 MED ORDER — ZOLPIDEM TARTRATE 10 MG PO TABS: 10.0000 mg | ORAL_TABLET | Freq: Every day | ORAL | 5 refills | Status: DC

## 2022-10-23 NOTE — Progress Notes (Signed)
Subjective:    Patient ID: Christine [name], female    DOB: 07-23-1976, 46 y.o.   MRN: [id number]   Chief Complaint: medical management of chronic issues     HPI:  Christine [name] is a 46 y.o. who identifies as a female who was assigned female at birth.   Social history: Lives with: husband and daugthter Work history: Well Scientist, water quality in [place] in today for follow up of the following chronic medical issues:  1. Primary hypertension No c/o chest pain, sob or headache. Does check blood pressure at home. Always runs normal at home BP Readings from Last 3 Encounters:  10/23/22 (!) 140/85  04/22/22 127/77  01/07/22 134/88     2. White coat syndrome with high blood pressure without hypertension Blood pressure always go upat doctors office.  3. Gastroesophageal reflux disease, unspecified whether esophagitis present Is on combination of pepcid and protonix daily. Is doing well.  4. Generalized anxiety disorder Patient has a very anxious personality and is always worried about something. Is on viibryd and xanax combination.    10/23/2022    2:56 PM 09/15/2022    9:32 AM 04/22/2022    2:31 PM 01/07/2022    8:54 AM  GAD 7 : Generalized Anxiety Score  Nervous, Anxious, on Edge _0 Control/stop worrying _1 Worry too much - different things 0 _2 Trouble relaxing 0 _3 Restless 0 1 1 0  Easily annoyed or irritable 0 _4 Afraid - awful might happen 0 _5 Total GAD 7 Score _6 Anxiety Difficulty Somewhat difficult Somewhat difficult Somewhat difficult Somewhat difficult      10/23/2022    2:56 PM 09/15/2022    9:31 AM 04/22/2022    2:31 PM 01/07/2022    8:53 AM 12/17/2021   10:17 AM  Depression screen PHQ 2/9  Decreased Interest _7 Down, Depressed, Hopeless _8 PHQ - 2 Score _9 Altered sleeping _10 0  Tired, decreased energy _11 Change in appetite _12 Feeling bad or failure about yourself  _13 Trouble concentrating 0 0 0 0 2  Moving slowly or fidgety/restless 0 0 0 0 0  Suicidal thoughts 0 0 0 0 0  PHQ-9 Score _14 Difficult doing work/chores Not difficult at all Somewhat difficult Somewhat difficult Somewhat difficult Somewhat difficult      5. Vitamin D deficiency Is suppose to be on daily vitamin d supplement Last vitamin D Lab Results  Component Value Date   VD25OH 81.5 12/17/2021     6. Primary insomnia Is on ambien to sleep at night. Sleeps about 7-8 hours a night  7. BMI 32.0-32.9,adult Weight is up 3 lbs Wt Readings from Last 3 Encounters:  10/23/22 150 lb (68 kg)  09/15/22 147 lb (66.7 kg)  04/22/22 145 lb (65.8 kg)   BMI Readings from Last 3 Encounters:  10/23/22 25.75 kg/m  09/15/22 25.23 kg/m  04/22/22 24.89 kg/m      New complaints: Wants hormone levels checked to see if she is menopausal. Having IBS flare up but overall is doing well.  Allergies  Allergen Reactions   Sulfa Antibiotics Hives   Sulfasalazine Hives  Doxycycline Hyclate    Ivp Dye [Iodinated Contrast Media] Hives   Other     ALPHA GAL - MEAT ALLERGY    Paxil [Paroxetine Hcl] Other (See Comments)    Made pt very angry    Phosphorus Diarrhea   Outpatient Encounter Medications as of 10/23/2022  Medication Sig   fluconazole (DIFLUCAN) 150 MG tablet 1 po q week x 4 weeks   albuterol (PROVENTIL) (2.5 MG/3ML) 0.083% nebulizer solution Take 3 mLs (2.5 mg total) by nebulization every 4 (four) hours as needed for wheezing or shortness of breath.   ALPRAZolam (XANAX) 0.25 MG tablet 1 po daily prn   amoxicillin-clavulanate (AUGMENTIN) 875-125 MG tablet Take 1 tablet by mouth 2 (two) times daily.   Cholecalciferol (VITAMIN D3) 1000 units CAPS Take by mouth.   conjugated estrogens (PREMARIN) vaginal cream Premarin 0.625 mg/gram vaginal cream .   ethynodiol-ethinyl estradiol (KELNOR 1/35) 1-35 MG-MCG tablet Take 1 tablet by mouth daily.   famotidine (PEPCID) 40 MG  tablet Take 1 tablet (40 mg total) by mouth in the morning and at bedtime.   Fluticasone Propionate (XHANCE) 93 MCG/ACT EXHU Place 2 sprays into the nose 2 (two) times daily.   hydrochlorothiazide (MICROZIDE) 12.5 MG capsule Take 1 capsule (12.5 mg total) by mouth daily.   Hydrocortisone Acetate 1 % OINT Use prn twice daily   montelukast (SINGULAIR) 10 MG tablet TAKE 1 TABLET BY MOUTH EVERYDAY AT BEDTIME   nitrofurantoin, macrocrystal-monohydrate, (MACROBID) 100 MG capsule Take 1 capsule (100 mg total) by mouth 2 (two) times daily. 1 po BId   pantoprazole (PROTONIX) 40 MG tablet Take 1 tablet (40 mg total) by mouth daily.   phenazopyridine (PYRIDIUM) 95 MG tablet Take 1 tablet (95 mg total) by mouth 3 (three) times daily as needed for pain.   Tiotropium Bromide Monohydrate (SPIRIVA RESPIMAT) 1.25 MCG/ACT AERS INHALE 1 PUFF BY MOUTH INTO THE LUNGS EVERY DAY   Tiotropium Bromide-Olodaterol (STIOLTO RESPIMAT) 2.5-2.5 MCG/ACT AERS Inhale 2 puffs into the lungs in the morning and at bedtime.   VENTOLIN HFA 108 (90 Base) MCG/ACT inhaler TAKE 2 PUFFS BY MOUTH EVERY 6 HOURS AS NEEDED FOR WHEEZE OR SHORTNESS OF BREATH   Vilazodone HCl (VIIBRYD) 20 MG TABS Take 1 tablet (20 mg total) by mouth daily.   zolpidem (AMBIEN) 10 MG tablet Take 1 tablet (10 mg total) by mouth at bedtime.   No facility-administered encounter medications on file as of 10/23/2022.    Past Surgical History:  Procedure Laterality Date   BREAST BIOPSY Right 07/2015   fibrocystic changes   CESAREAN SECTION     TONSILLECTOMY AND ADENOIDECTOMY      Family History  Problem Relation Age of Onset   Diabetes Mother    Hypertension Mother    Hyperlipidemia Father    Hypertension Father    Allergic rhinitis Neg Hx    Angioedema Neg Hx    Asthma Neg Hx    Eczema Neg Hx    Immunodeficiency Neg Hx    Urticaria Neg Hx       Controlled substance contract: n/a     Review of Systems  Constitutional:  Negative for diaphoresis.   Eyes:  Negative for pain.  Respiratory:  Negative for shortness of breath.   Cardiovascular:  Negative for chest pain, palpitations and leg swelling.  Gastrointestinal:  Positive for abdominal pain and diarrhea.  Endocrine: Negative for polydipsia.  Skin:  Negative for rash.  Neurological:  Negative for dizziness, weakness and headaches.  Hematological:  Does not bruise/bleed easily.  All other systems reviewed and are negative.      Objective:   Physical Exam Vitals and nursing note reviewed.  Constitutional:      General: She is not in acute distress.    Appearance: Normal appearance. She is well-developed.  HENT:     Head: Normocephalic.     Right Ear: Tympanic membrane normal.     Left Ear: Tympanic membrane normal.     Nose: Nose normal.     Mouth/Throat:     Mouth: Mucous membranes are moist.  Eyes:     Pupils: Pupils are equal, round, and reactive to light.  Neck:     Vascular: No carotid bruit or JVD.  Cardiovascular:     Rate and Rhythm: Normal rate and regular rhythm.     Heart sounds: Normal heart sounds.  Pulmonary:     Effort: Pulmonary effort is normal. No respiratory distress.     Breath sounds: Normal breath sounds. No wheezing or rales.  Chest:     Chest wall: No tenderness.  Abdominal:     General: Bowel sounds are normal. There is no distension or abdominal bruit.     Palpations: Abdomen is soft. There is no hepatomegaly, splenomegaly, mass or pulsatile mass.     Tenderness: There is no abdominal tenderness.  Musculoskeletal:        General: Normal range of motion.     Cervical back: Normal range of motion and neck supple.  Lymphadenopathy:     Cervical: No cervical adenopathy.  Skin:    General: Skin is warm and dry.  Neurological:     Mental Status: She is alert and oriented to person, place, and time.     Deep Tendon Reflexes: Reflexes are normal and symmetric.  Psychiatric:        Behavior: Behavior normal.        Thought Content: Thought  content normal.        Judgment: Judgment normal.     BP (!) 140/85   Pulse 73   Temp 98 F (36.7 C) (Temporal)   Resp 20   Ht _0  (1.626 m)   Wt 150 lb (68 kg)   SpO2 100%   BMI 25.75 kg/m        Assessment & Plan:  Christine [name] comes in today with chief complaint of Medical Management of Chronic Issues   Diagnosis and orders addressed:  1. Primary hypertension Low sodium diet - CBC with Differential/Platelet - CMP14+EGFR - Lipid panel - hydrochlorothiazide (MICROZIDE) 12.5 MG capsule; Take 1 capsule (12.5 mg total) by mouth daily.  Dispense: 90 capsule; Refill: 1  2. White coat syndrome with high blood pressure without hypertension  3. Gastroesophageal reflux disease, unspecified whether esophagitis present Avoid spicy foods Do not eat 2 hours prior to bedtime - pantoprazole (PROTONIX) 40 MG tablet; Take 1 tablet (40 mg total) by mouth daily.  Dispense: 90 tablet; Refill: 1 - famotidine (PEPCID) 40 MG tablet; Take 1 tablet (40 mg total) by mouth in the morning and at bedtime.  Dispense: 60 tablet; Refill: 5  4. Generalized anxiety disorder Stress maanegemnt - ALPRAZolam (XANAX) 0.25 MG tablet; 1 po daily prn  Dispense: 20 tablet; Refill: 2 - Vilazodone HCl (VIIBRYD) 20 MG TABS; Take 1 tablet (20 mg total) by mouth daily.  Dispense: 90 tablet; Refill: 1  5. Vitamin D deficiency Continue daily vitamin d supplement  6. Primary insomnia Bedtime routine - zolpidem (AMBIEN) 10  MG tablet; Take 1 tablet (10 mg total) by mouth at bedtime.  Dispense: 30 tablet; Refill: 5  7. BMI 32.0-32.9,adult Discussed diet and exercise for person with BMI >25 Will recheck weight in 3-6 months   8. Irregular menses Labs pending - AMENORRHEA PROFILE  9. Mild persistent asthma, uncomplicated - montelukast (SINGULAIR) 10 MG tablet; TAKE 1 TABLET BY MOUTH EVERYDAY AT BEDTIME  Dispense: 90 tablet; Refill: 1 - Tiotropium Bromide-Olodaterol (STIOLTO RESPIMAT) 2.5-2.5 MCG/ACT  AERS; Inhale 2 puffs into the lungs in the morning and at bedtime.  Dispense: 4 g; Refill: 5   Labs pending Health Maintenance reviewed Diet and exercise encouraged  Follow up plan: 6 months   [name], FNP

## 2022-10-23 NOTE — Patient Instructions (Signed)
Menopause Menopause is the normal time of a woman's life when menstrual periods stop completely. It marks the natural end to a woman's ability to become pregnant. It can be defined as the absence of a menstrual period for 12 months without another medical cause. The transition to menopause (perimenopause) most often happens between the ages of 45 and 55, and can last for many years. During perimenopause, hormone levels change in your body, which can cause symptoms and affect your health. Menopause may increase your risk for: Weakened bones (osteoporosis), which causes fractures. Depression. Hardening and narrowing of the arteries (atherosclerosis), which can cause heart attacks and strokes. What are the causes? This condition is usually caused by a natural change in hormone levels that happens as you get older. The condition may also be caused by changes that are not natural, including: Surgery to remove both ovaries (surgical menopause). Side effects from some medicines, such as chemotherapy used to treat cancer (chemical menopause). What increases the risk? This condition is more likely to start at an earlier age if you have certain medical conditions or have undergone treatments, including: A tumor of the pituitary gland in the brain. A disease that affects the ovaries and hormones. Certain cancer treatments, such as chemotherapy or hormone therapy, or radiation therapy on the pelvis. Heavy smoking and excessive alcohol use. Family history of early menopause. This condition is also more likely to develop earlier in women who are very thin. What are the signs or symptoms? Symptoms of this condition include: Hot flashes. Irregular menstrual periods. Night sweats. Changes in feelings about sex. This could be a decrease in sex drive or an increased discomfort around your sexuality. Vaginal dryness and thinning of the vaginal walls. This may cause painful sex. Dryness of the skin and  development of wrinkles. Headaches. Problems sleeping (insomnia). Mood swings or irritability. Memory problems. Weight gain. Hair growth on the face and chest. Bladder infections or problems with urinating. How is this diagnosed? This condition is diagnosed based on your medical history, a physical exam, your age, your menstrual history, and your symptoms. Hormone tests may also be done. How is this treated? In some cases, no treatment is needed. You and your health care provider should make a decision together about whether treatment is necessary. Treatment will be based on your individual condition and preferences. Treatment for this condition focuses on managing symptoms. Treatment may include: Menopausal hormone therapy (MHT). Medicines to treat specific symptoms or complications. Acupuncture. Vitamin or herbal supplements. Before starting treatment, make sure to let your health care provider know if you have a personal or family history of these conditions: Heart disease. Breast cancer. Blood clots. Diabetes. Osteoporosis. Follow these instructions at home: Lifestyle Do not use any products that contain nicotine or tobacco, such as cigarettes, e-cigarettes, and chewing tobacco. If you need help quitting, ask your health care provider. Get at least 30 minutes of physical activity on 5 or more days each week. Avoid alcoholic and caffeinated beverages, as well as spicy foods. This may help prevent hot flashes. Get 7-8 hours of sleep each night. If you have hot flashes, try: Dressing in layers. Avoiding things that may trigger hot flashes, such as spicy food, warm places, or stress. Taking slow, deep breaths when a hot flash starts. Keeping a fan in your home and office. Find ways to manage stress, such as deep breathing, meditation, or journaling. Consider going to group therapy with other women who are having menopause symptoms. Ask your health care   provider about recommended  group therapy meetings. Eating and drinking  Eat a healthy, balanced diet that contains whole grains, lean protein, low-fat dairy, and plenty of fruits and vegetables. Your health care provider may recommend adding more soy to your diet. Foods that contain soy include tofu, tempeh, and soy milk. Eat plenty of foods that contain calcium and vitamin D for bone health. Items that are rich in calcium include low-fat milk, yogurt, beans, almonds, sardines, broccoli, and kale. Medicines Take over-the-counter and prescription medicines only as told by your health care provider. Talk with your health care provider before starting any herbal supplements. If prescribed, take vitamins and supplements as told by your health care provider. General instructions  Keep track of your menstrual periods, including: When they occur. How heavy they are and how long they last. How much time passes between periods. Keep track of your symptoms, noting when they start, how often you have them, and how long they last. Use vaginal lubricants or moisturizers to help with vaginal dryness and improve comfort during sex. Keep all follow-up visits. This is important. This includes any group therapy or counseling. Contact a health care provider if: You are still having menstrual periods after age 55. You have pain during sex. You have not had a period for 12 months and you develop vaginal bleeding. Get help right away if you have: Severe depression. Excessive vaginal bleeding. Pain when you urinate. A fast or irregular heartbeat (palpitations). Severe headaches. Abdominal pain or severe indigestion. Summary Menopause is a normal time of life when menstrual periods stop completely. It is usually defined as the absence of a menstrual period for 12 months without another medical cause. The transition to menopause (perimenopause) most often happens between the ages of 45 and 55 and can last for several years. Symptoms  can be managed through medicines, lifestyle changes, and complementary therapies such as acupuncture. Eat a balanced diet that is rich in nutrients to promote bone health and heart health and to manage symptoms during menopause. This information is not intended to replace advice given to you by your health care provider. Make sure you discuss any questions you have with your health care provider. Document Revised: 08/10/2020 Document Reviewed: 04/26/2020 Elsevier Patient Education  2023 [employer]  

## 2022-10-24 LAB — CMP14+EGFR
ALT: 13 IU/L (ref 0–32)
AST: 15 IU/L (ref 0–40)
Albumin/Globulin Ratio: 1.7 (ref 1.2–2.2)
Albumin: 4.4 g/dL (ref 3.9–4.9)
Alkaline Phosphatase: 66 IU/L (ref 44–121)
BUN/Creatinine Ratio: 13 (ref 9–23)
BUN: 10 mg/dL (ref 6–24)
Bilirubin Total: 0.5 mg/dL (ref 0.0–1.2)
CO2: 23 mmol/L (ref 20–29)
Calcium: 9.6 mg/dL (ref 8.7–10.2)
Chloride: 99 mmol/L (ref 96–106)
Creatinine, Ser: 0.79 mg/dL (ref 0.57–1.00)
Globulin, Total: 2.6 g/dL (ref 1.5–4.5)
Glucose: 73 mg/dL (ref 70–99)
Potassium: 4 mmol/L (ref 3.5–5.2)
Sodium: 138 mmol/L (ref 134–144)
Total Protein: 7 g/dL (ref 6.0–8.5)
eGFR: 92 mL/min/{1.73_m2} (ref >59)

## 2022-10-24 LAB — CBC WITH DIFFERENTIAL/PLATELET
Basophils Absolute: 0.1 10*3/uL (ref 0.0–0.2)
Basos: 1 %
EOS (ABSOLUTE): 0.1 10*3/uL (ref 0.0–0.4)
Eos: 1 %
Hematocrit: 38.4 % (ref 34.0–46.6)
Hemoglobin: 13.1 g/dL (ref 11.1–15.9)
Immature Grans (Abs): 0 10*3/uL (ref 0.0–0.1)
Immature Granulocytes: 0 %
Lymphocytes Absolute: 3 10*3/uL (ref 0.7–3.1)
Lymphs: 34 %
MCH: 29.1 pg (ref 26.6–33.0)
MCHC: 34.1 g/dL (ref 31.5–35.7)
MCV: 85 fL (ref 79–97)
Monocytes Absolute: 0.5 10*3/uL (ref 0.1–0.9)
Monocytes: 5 %
Neutrophils Absolute: 5.4 10*3/uL (ref 1.4–7.0)
Neutrophils: 59 %
Platelets: 334 10*3/uL (ref 150–450)
RBC: 4.5 x10E6/uL (ref 3.77–5.28)
RDW: 13 % (ref 11.7–15.4)
WBC: 9 10*3/uL (ref 3.4–10.8)

## 2022-10-24 LAB — LIPID PANEL
Chol/HDL Ratio: 3.5 ratio (ref 0.0–4.4)
Cholesterol, Total: 261 mg/dL — ABNORMAL HIGH (ref 100–199)
HDL: 75 mg/dL (ref >39)
LDL Chol Calc (NIH): 175 mg/dL — ABNORMAL HIGH (ref 0–99)
Triglycerides: 67 mg/dL (ref 0–149)
VLDL Cholesterol Cal: 11 mg/dL (ref 5–40)

## 2022-10-24 LAB — AMENORRHEA PROFILE
FSH: 12.6 m[IU]/mL
LH: 4.6 m[IU]/mL
Prolactin: 13.5 ng/mL (ref 4.8–23.3)

## 2022-10-27 ENCOUNTER — Other Ambulatory Visit: Payer: Self-pay

## 2022-10-27 ENCOUNTER — Ambulatory Visit: Payer: BC Managed Care – PPO | Admitting: Internal Medicine

## 2022-10-27 ENCOUNTER — Encounter: Payer: Self-pay | Admitting: Internal Medicine

## 2022-10-27 VITALS — BP 126/78 | HR 77 | Temp 97.9°F | Resp 18 | Ht 64.0 in | Wt 152.2 lb

## 2022-10-27 DIAGNOSIS — K219 Gastro-esophageal reflux disease without esophagitis: Secondary | ICD-10-CM | POA: Diagnosis not present

## 2022-10-27 DIAGNOSIS — J3089 Other allergic rhinitis: Secondary | ICD-10-CM

## 2022-10-27 DIAGNOSIS — J4540 Moderate persistent asthma, uncomplicated: Secondary | ICD-10-CM | POA: Diagnosis not present

## 2022-10-27 DIAGNOSIS — J209 Acute bronchitis, unspecified: Secondary | ICD-10-CM

## 2022-10-27 DIAGNOSIS — Z91018 Allergy to other foods: Secondary | ICD-10-CM

## 2022-10-27 DIAGNOSIS — J4541 Moderate persistent asthma with (acute) exacerbation: Secondary | ICD-10-CM

## 2022-10-27 DIAGNOSIS — J4531 Mild persistent asthma with (acute) exacerbation: Secondary | ICD-10-CM

## 2022-10-27 DIAGNOSIS — J302 Other seasonal allergic rhinitis: Secondary | ICD-10-CM

## 2022-10-27 MED ORDER — METHYLPREDNISOLONE ACETATE 40 MG/ML IJ SUSP
40.0000 mg | Freq: Once | INTRAMUSCULAR | Status: AC
  Administered 2022-10-27: 40 mg via INTRAMUSCULAR

## 2022-10-27 MED ORDER — AZITHROMYCIN 250 MG PO TABS: ORAL_TABLET | ORAL | 0 refills | Status: DC

## 2022-10-27 MED ORDER — PREDNISONE 10 MG PO TABS: ORAL_TABLET | ORAL | 0 refills | Status: AC

## 2022-10-27 MED ORDER — EPINEPHRINE 0.3 MG/0.3ML IJ SOAJ: 0.3000 mg | INTRAMUSCULAR | 2 refills | Status: AC | PRN

## 2022-10-27 NOTE — Patient Instructions (Addendum)
1. Mild persistent asthma, with exacerbation- with history of thrush - covid testing negative - 40 mg IM methylprednisolone in clinic - start 20 mg prednisone tomorrow daily for 3 days, then 10 mg on day 4 - z-pack; start in 2 days if no improvement with above  - Stiolto two puffs twice daily (samples provided).  - Daily controller medication(s): Singulair 10mg  daily and Stiolto two puffs once daily - Prior to physical activity: albuterol 2 puffs 10-15 minutes before physical activity. - Rescue medications: albuterol 4 puffs every 4-6 hours as needed or albuterol nebulizer one vial every 4-6 hours as needed - Asthma control goals:  * Full participation in all desired activities (may need albuterol before activity) * Albuterol use two time or less a week on average (not counting use with activity) * Cough interfering with sleep two time or less a month * Oral steroids no more than once a year * No hospitalizations  2. Reflux - Continue with Protonix 40 mg once a day for reflux. - Continue with Tums as needed.  - Add on Pepcid 20mg  twice daily as needed  3. Seasonal and perennial allergic rhinitis - Continue with nasal saline rinses - Continue with nasonex 2 sprays nightly  4. Alpha gal allergy - Epipen sent to pharmacy.  - Continue to avoid mammalian meats.   5. Return in about 6 months (around 04/28/2023).    Please inform of any Emergency Department visits, hospitalizations, or changes in symptoms. Call 06/28/2023 before going to the ED for breathing or allergy symptoms since we might be able to fit you in for a sick visit. Feel free to contact us anytime with any questions, problems, or concerns.  It was a pleasure to see you again today!  Websites that have reliable patient information: 1. American Academy of Asthma, Allergy, and Immunology: www.aaaai.org 2. Food Allergy Research and Education (FARE): foodallergy.org 3. Mothers of Asthmatics:  http://www.asthmacommunitynetwork.org 4. American College of Allergy, Asthma, and Immunology: www.acaai.org

## 2022-10-27 NOTE — Progress Notes (Signed)
FOLLOW UP Date of Service/Encounter:  10/27/22   Subjective:  Christine [name] (DOB: 31-Oct-1976) is a 46 y.o. female who returns to the [facility] on 10/27/2022 in re-evaluation of the following: acute visit for congestion History obtained from: chart review and patient.  For Review, LV was on 03/07/21  with Dr. Ernst Bowler seen for routine follow-up of Moderate persistent asthma, allergic rhinitis, reflux, alpha gal allergy.  Today presents for follow-up. First part of last week had phlegm in her throat that now feels like it has settled in her chest.  She did have body aches on Saturday which have continued for the past several days.  She has not tested for COVID.  She does not have a fever or other sick symptoms. She is using her Stiolto daily as prescribed.  She is unable to tolerate inhaled steroids due to recurrent thrush. Seen at PCP on Friday and told to continue using flonase, but feels she has someone sitting on her chest due to tightness.  Feels asthma is flared.  . She is using her albuterol daily since these symptoms started and does get some relief.  Prior to this, she had not used her rescue inhaler in a very long time. She is not sure when her last prednisone course was.     She continues to avoid mammalian meat without any accidental exposures.  She does need a refill of her EpiPen. Her reflux is controlled with Protonix daily and she will use Pepcid as needed. Her allergic rhinitis controlled with Nasonex.   Allergies as of 10/27/2022       Reactions   Sulfa Antibiotics Hives   Sulfasalazine Hives   Alpha-gal    Doxycycline Hyclate    Ivp Dye [iodinated Contrast Media] Hives   Other    ALPHA GAL - MEAT ALLERGY    Paxil [paroxetine Hcl] Other (See Comments)   Made pt very angry   Phosphorus Diarrhea        Medication List        Accurate as of October 27, 2022  4:57 PM. If you have any questions, ask your nurse or doctor.           albuterol (2.5 MG/3ML) 0.083% nebulizer solution Commonly known as: PROVENTIL Take 3 mLs (2.5 mg total) by nebulization every 4 (four) hours as needed for wheezing or shortness of breath.   Ventolin HFA 108 (90 Base) MCG/ACT inhaler Generic drug: albuterol TAKE 2 PUFFS BY MOUTH EVERY 6 HOURS AS NEEDED FOR WHEEZE OR SHORTNESS OF BREATH   ALPRAZolam 0.25 MG tablet Commonly known as: XANAX 1 po daily prn   azithromycin 250 MG tablet Commonly known as: Zithromax Z-Pak Take 2 tablets by mouth on day 1, then 1 tablet daily on days 2-5. Started by: Clemon Chambers, MD   EPINEPHrine 0.3 mg/0.3 mL Soaj injection Commonly known as: EpiPen 2-Pak Inject 0.3 mg into the muscle as needed for anaphylaxis. Started by: Clemon Chambers, MD   ethynodiol-ethinyl estradiol 1-35 MG-MCG tablet Commonly known as: Kelnor 1/35 Take 1 tablet by mouth daily.   famotidine 40 MG tablet Commonly known as: PEPCID Take 1 tablet (40 mg total) by mouth in the morning and at bedtime.   hydrochlorothiazide 12.5 MG capsule Commonly known as: Microzide Take 1 capsule (12.5 mg total) by mouth daily.   Hydrocortisone Acetate 1 % Oint Use prn twice daily   montelukast 10 MG tablet Commonly known as: SINGULAIR TAKE 1 TABLET BY MOUTH  EVERYDAY AT BEDTIME   pantoprazole 40 MG tablet Commonly known as: PROTONIX Take 1 tablet (40 mg total) by mouth daily.   predniSONE 10 MG tablet Commonly known as: DELTASONE Take 20 mg by mouth daily for 3 days, then 10 mg by mouth daily on day 4. Start taking on: October 28, 2022 Started by: Verlee Monte, MD   Premarin vaginal cream Generic drug: conjugated estrogens Premarin 0.625 mg/gram vaginal cream .   Stiolto Respimat 2.5-2.5 MCG/ACT Aers Generic drug: Tiotropium Bromide-Olodaterol Inhale 2 puffs into the lungs in the morning and at bedtime.   Vilazodone HCl 20 MG Tabs Commonly known as: Viibryd Take 1 tablet (20 mg total) by mouth daily.   Vitamin D3 25 MCG  (1000 UT) Caps Take by mouth.   Xhance 93 MCG/ACT Exhu Generic drug: Fluticasone Propionate Place 2 sprays into the nose 2 (two) times daily.   zolpidem 10 MG tablet Commonly known as: AMBIEN Take 1 tablet (10 mg total) by mouth at bedtime.       Past Medical History:  Diagnosis Date   Asthma    GAD (generalized anxiety disorder)    Vitamin D deficiency    Past Surgical History:  Procedure Laterality Date   BREAST BIOPSY Right 07/2015   fibrocystic changes   CESAREAN SECTION     TONSILLECTOMY AND ADENOIDECTOMY     Otherwise, there have been no changes to her past medical history, surgical history, family history, or social history.  ROS: All others negative except as noted per HPI.   Objective:  BP 126/78 (BP Location: Left Arm, Patient Position: Sitting, Cuff Size: Small)   Pulse 77   Temp 97.9 F (36.6 C) (Temporal)   Resp 18   Ht 5\' 4"  (1.626 m)   Wt 152 lb 3.2 oz (69 kg)   SpO2 97%   BMI 26.13 kg/m  Body mass index is 26.13 kg/m. Physical Exam: General Appearance:  Alert, cooperative, no distress, appears stated age  Head:  Normocephalic, without obvious abnormality, atraumatic  Eyes:  Conjunctiva clear, EOM's intact  Nose: Nares normal, hypertrophic turbinates, normal mucosa, and no visible anterior polyps  Throat: Lips, tongue normal; teeth and gums normal, normal posterior oropharynx  Neck: Supple, symmetrical  Lungs:   clear to auscultation bilaterally, Respirations unlabored, intermittent dry coughing  Heart:  regular rate and rhythm and no murmur, Appears well perfused  Extremities: No edema  Skin: Skin color, texture, turgor normal, no rashes or lesions on visualized portions of skin  Neurologic: No gross deficits    Spirometry:  Tracings reviewed. Her effort: Good reproducible efforts. FVC: 3.01L FEV1: 2.33L, 84% predicted FEV1/FVC ratio: 95% Interpretation: Spirometry consistent with normal pattern.  Please see scanned spirometry results  for details.  Assessment/Plan   1. Mild persistent asthma, with exacerbation- with history of thrush - covid testing negative - 40 mg IM methylprednisolone in clinic - start 20 mg prednisone tomorrow daily for 3 days, then 10 mg on day 4 - z-pack; start in 2 days if no improvement with above  - Stiolto two puffs twice daily as per previous plan though preferably she is on an inhaled steroid given her history.  - Daily controller medication(s): Singulair 10mg  daily and Stiolto two puffs once daily - Prior to physical activity: albuterol 2 puffs 10-15 minutes before physical activity. - Rescue medications: albuterol 4 puffs every 4-6 hours as needed or albuterol nebulizer one vial every 4-6 hours as needed - Asthma control goals:  * Full  participation in all desired activities (may need albuterol before activity) * Albuterol use two time or less a week on average (not counting use with activity) * Cough interfering with sleep two time or less a month * Oral steroids no more than once a year * No hospitalizations  2. Reflux-stable - Continue with Protonix 40 mg once a day for reflux. - Continue with Tums as needed.  - Add on Pepcid 20mg  twice daily as needed  3. Seasonal and perennial allergic rhinitis-stable - Continue with nasal saline rinses - Continue with nasonex 2 sprays nightly  4. Alpha gal allergy-stable - Epipen sent to pharmacy.  - Continue to avoid mammalian meats.   5. Return in about 6 months (around 04/28/2023).   Sigurd Sos, MD  [facility]

## 2022-10-28 ENCOUNTER — Encounter: Payer: Self-pay | Admitting: *Deleted

## 2022-10-28 ENCOUNTER — Telehealth: Payer: Self-pay | Admitting: Internal Medicine

## 2022-10-28 NOTE — Telephone Encounter (Signed)
Pt states she left a my chart message about a letter she needs for work. It should state that she was seen in our office, she is on medication and it is recommended that she work from home.

## 2022-10-28 NOTE — Telephone Encounter (Signed)
Pt states she has a "splotchy, red, warm" rash on stomach and chest, she thinks it may be from the steroid but wanted a second opinion.

## 2022-10-28 NOTE — Telephone Encounter (Signed)
Please send an option for yesterday, today and tomorrow. She should be feeling better by Thursday and as long as afebrile can return to work.

## 2022-10-29 ENCOUNTER — Other Ambulatory Visit: Payer: Self-pay

## 2022-10-29 NOTE — Telephone Encounter (Signed)
Prednisone can cause flushing because it amps up your heart rate, but it sounds like the hives were probably not related to the prednisone if they appeared that long after taking and you have a history of hives.

## 2022-10-29 NOTE — Telephone Encounter (Signed)
Sent pt a mychart message about this 

## 2022-10-29 NOTE — Telephone Encounter (Signed)
Was really red and hot, not itchy and was worried it was the prednisone could this happen due to prednisone pt is prong to hives . As of this am it has gone away

## 2022-10-29 NOTE — Telephone Encounter (Signed)
Unlikely but not impossible.  Did it happen within a 2 hours window of taking the prednisone? If not, unlikely related.

## 2022-11-06 ENCOUNTER — Other Ambulatory Visit: Payer: Self-pay | Admitting: Nurse Practitioner

## 2022-11-06 DIAGNOSIS — F5101 Primary insomnia: Secondary | ICD-10-CM

## 2022-11-07 ENCOUNTER — Telehealth: Payer: Self-pay

## 2022-11-07 ENCOUNTER — Telehealth: Payer: Self-pay | Admitting: Nurse Practitioner

## 2022-11-07 NOTE — Telephone Encounter (Signed)
Patient not able to fill 30 days due to insurance only allowing her to receive 15 every 25 days. I gave verbal ok to fill for 15 days due to patient not having any on hand as of now. Pharmacy is sending pa to start for 30 tablets

## 2022-11-07 NOTE — Telephone Encounter (Signed)
Patient not able to fill 30 days due to insurance only allowing her to receive 15 every 25 days. I gave verbal ok to fill for 15 days due to patient not having any on hand as of now. Pharmacy is sending pa to start for 30 tablets 

## 2022-11-08 DIAGNOSIS — F321 Major depressive disorder, single episode, moderate: Secondary | ICD-10-CM | POA: Diagnosis not present

## 2022-11-10 ENCOUNTER — Other Ambulatory Visit (HOSPITAL_COMMUNITY): Payer: Self-pay

## 2022-11-11 ENCOUNTER — Other Ambulatory Visit (HOSPITAL_COMMUNITY): Payer: Self-pay

## 2022-11-14 ENCOUNTER — Other Ambulatory Visit (HOSPITAL_COMMUNITY): Payer: Self-pay

## 2022-12-06 DIAGNOSIS — F321 Major depressive disorder, single episode, moderate: Secondary | ICD-10-CM | POA: Diagnosis not present

## 2022-12-06 IMAGING — CT CT ANGIO CHEST
2 of 7 series · 13 of 36 positions shown · IV contrast (omnipaque)
Comparison: None.

CLINICAL DATA: Shortness of breath and chest discomfort.

EXAM:
CT ANGIOGRAPHY CHEST WITH CONTRAST
TECHNIQUE: Multidetector CT imaging of the chest was performed using the
standard protocol during bolus administration of intravenous
contrast. Multiplanar CT image reconstructions and MIPs were
obtained to evaluate the vascular anatomy.
CONTRAST:  100mL OMNIPAQUE IOHEXOL 350 MG/ML SOLN

[Series 5: pe axial thins · axial · 0.69mm/px · z∈[-601,-370]mm · 12 of 274 slices shown]
[im 22/274  lung]
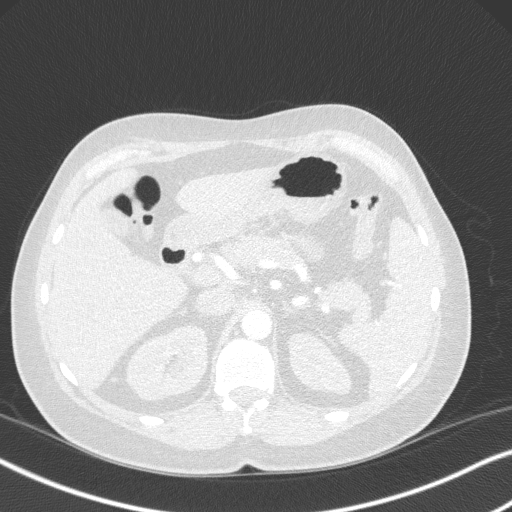
[im 43/274  mediastinal]
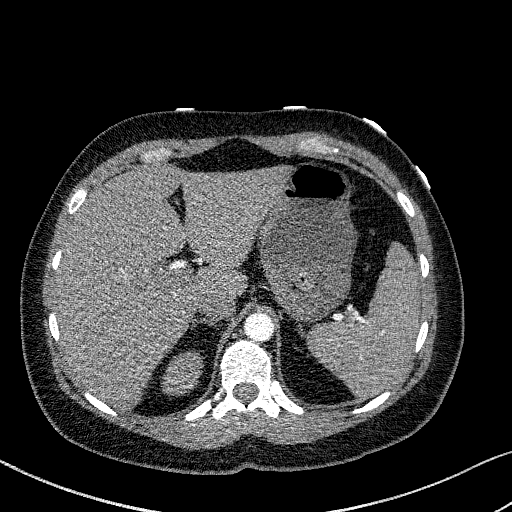
[im 64/274  lung]
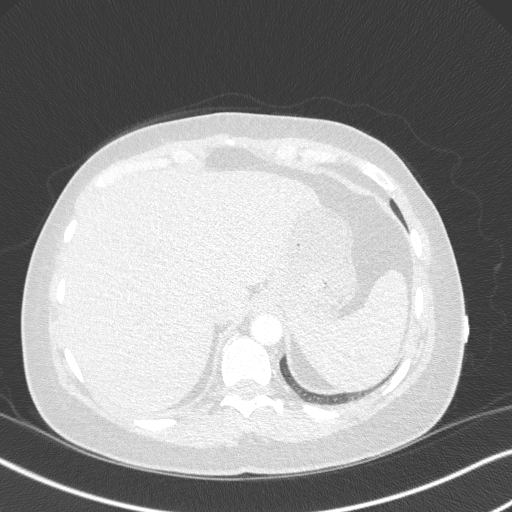
[im 85/274  mediastinal]
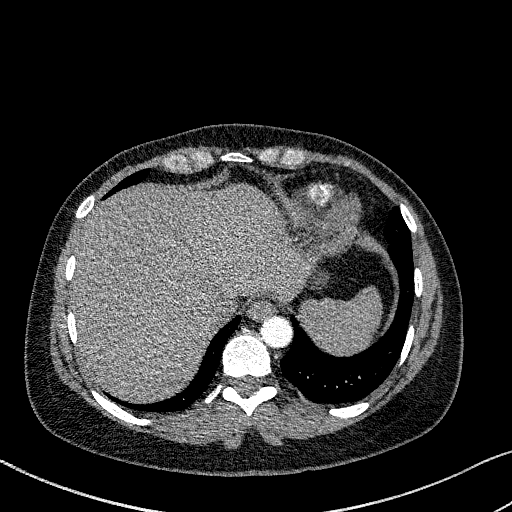
[im 106/274  lung]
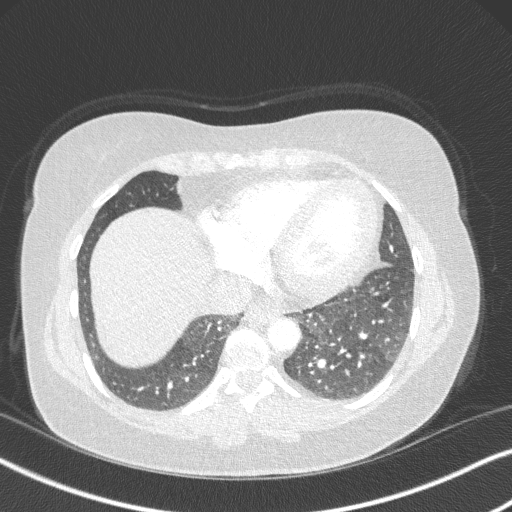
[im 127/274  mediastinal]
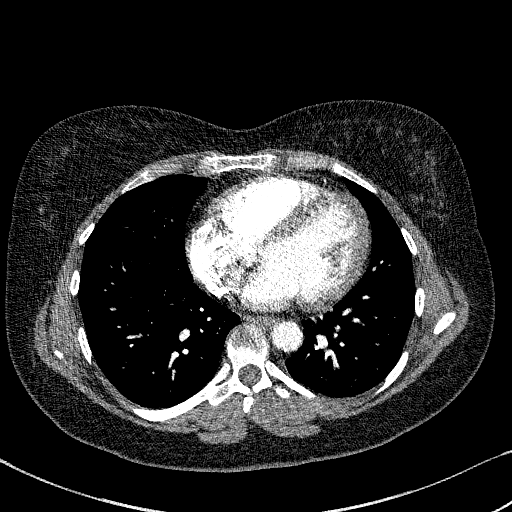
[im 148/274  lung]
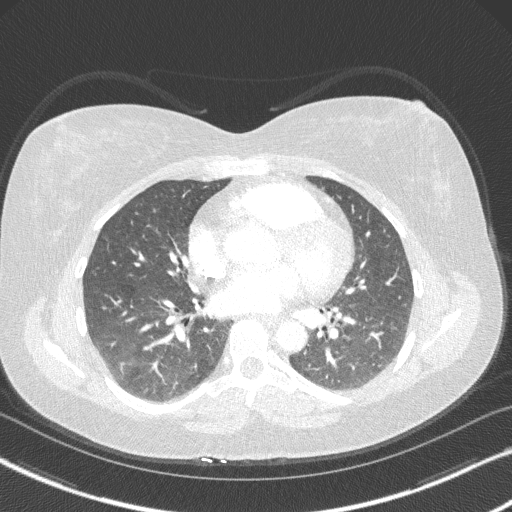
[im 169/274  mediastinal]
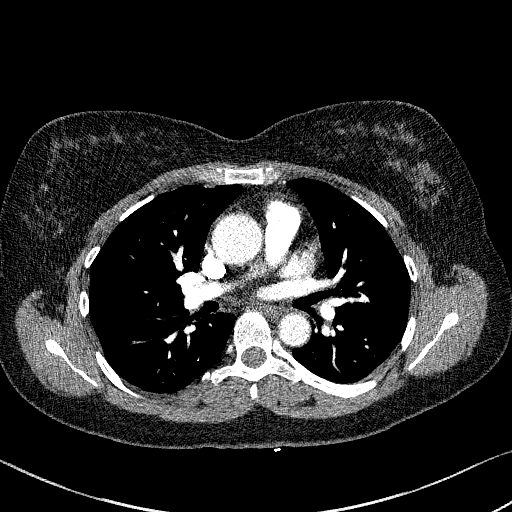
[im 190/274  lung]
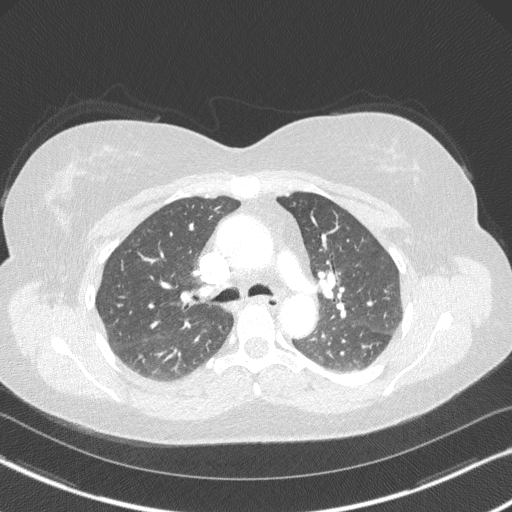
[im 211/274  mediastinal]
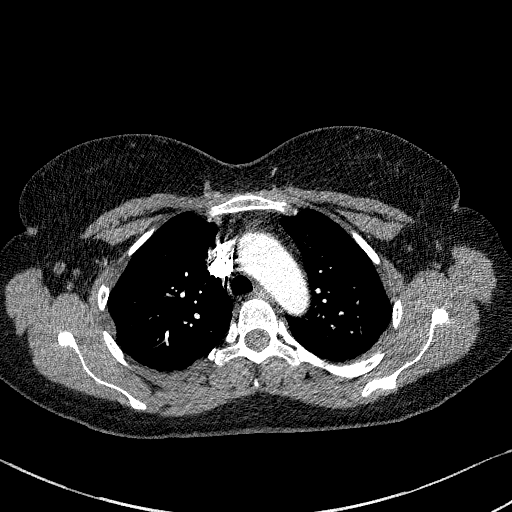
[im 232/274  lung]
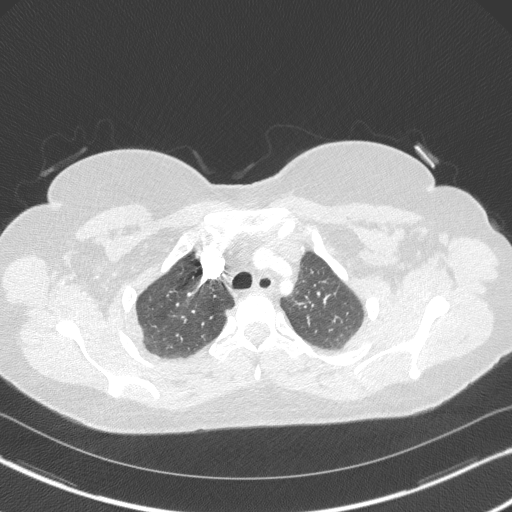
[im 253/274  mediastinal]
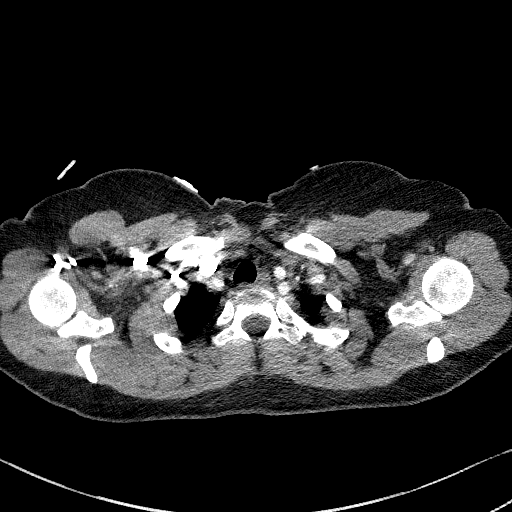

[Series 7: cor soft · coronal · 0.56mm/px · 1 of 112 slices shown]
[im 56/112  mediastinal]
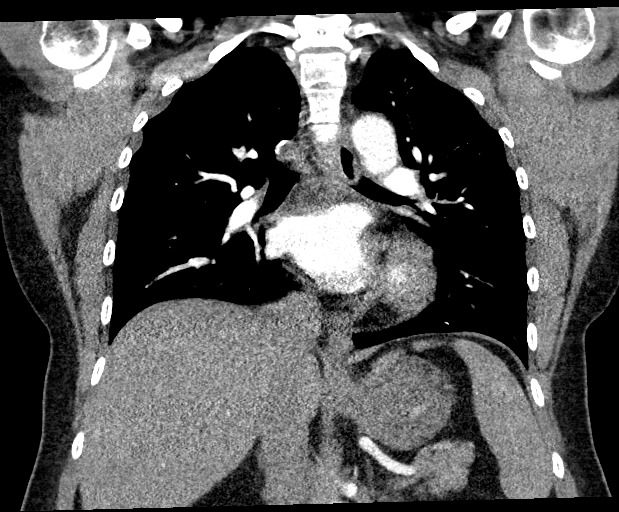

[13 of 36 positions shown; findings below may reference images not displayed]

FINDINGS: Cardiovascular: Satisfactory opacification of the pulmonary arteries
to the segmental level. No evidence of pulmonary embolism. Normal
heart size. No pericardial effusion.

Mediastinum/Nodes: No enlarged mediastinal, hilar, or axillary lymph
nodes. Thyroid gland, trachea, and esophagus demonstrate no
significant findings.

Lungs/Pleura: Lungs are clear. No pleural effusion or pneumothorax.

Upper Abdomen: No acute abnormality.

Musculoskeletal: No chest wall abnormality. No acute or significant
osseous findings.

Review of the MIP images confirms the above findings.
IMPRESSION: No evidence of pulmonary embolism or acute cardiopulmonary disease.

## 2022-12-20 ENCOUNTER — Other Ambulatory Visit: Payer: Self-pay | Admitting: Nurse Practitioner

## 2022-12-20 DIAGNOSIS — J4530 Mild persistent asthma, uncomplicated: Secondary | ICD-10-CM

## 2023-01-03 DIAGNOSIS — F321 Major depressive disorder, single episode, moderate: Secondary | ICD-10-CM | POA: Diagnosis not present

## 2023-01-31 DIAGNOSIS — F321 Major depressive disorder, single episode, moderate: Secondary | ICD-10-CM | POA: Diagnosis not present

## 2023-03-07 DIAGNOSIS — F321 Major depressive disorder, single episode, moderate: Secondary | ICD-10-CM | POA: Diagnosis not present

## 2023-03-28 DIAGNOSIS — F321 Major depressive disorder, single episode, moderate: Secondary | ICD-10-CM | POA: Diagnosis not present

## 2023-04-25 DIAGNOSIS — F321 Major depressive disorder, single episode, moderate: Secondary | ICD-10-CM | POA: Diagnosis not present

## 2023-04-29 ENCOUNTER — Other Ambulatory Visit: Payer: Self-pay | Admitting: Nurse Practitioner

## 2023-04-29 DIAGNOSIS — F5101 Primary insomnia: Secondary | ICD-10-CM

## 2023-04-29 MED ORDER — ZOLPIDEM TARTRATE 10 MG PO TABS: 10.0000 mg | ORAL_TABLET | Freq: Every day | ORAL | 5 refills | Status: DC

## 2023-04-29 NOTE — Telephone Encounter (Signed)
Pt says she can't get her last refill because it has expired. Was supposed to fill by 04/21/23 but pt still had medicine at the time so she didn't need it refilled yet. Pt is completely out and needs her last refill.

## 2023-04-29 NOTE — Addendum Note (Signed)
Addended by: Julious Payer D on: 04/29/2023 02:06 PM   Modules accepted: Orders

## 2023-04-30 ENCOUNTER — Ambulatory Visit: Payer: BC Managed Care – PPO | Admitting: Internal Medicine

## 2023-05-04 ENCOUNTER — Encounter: Payer: Self-pay | Admitting: Nurse Practitioner

## 2023-05-04 ENCOUNTER — Ambulatory Visit: Payer: BC Managed Care – PPO | Admitting: Nurse Practitioner

## 2023-05-04 VITALS — BP 143/78 | HR 75 | Temp 98.4°F | Resp 20 | Ht 64.0 in | Wt 152.0 lb

## 2023-05-04 DIAGNOSIS — J4530 Mild persistent asthma, uncomplicated: Secondary | ICD-10-CM | POA: Diagnosis not present

## 2023-05-04 DIAGNOSIS — R6889 Other general symptoms and signs: Secondary | ICD-10-CM | POA: Diagnosis not present

## 2023-05-04 DIAGNOSIS — Z91018 Allergy to other foods: Secondary | ICD-10-CM

## 2023-05-04 DIAGNOSIS — E559 Vitamin D deficiency, unspecified: Secondary | ICD-10-CM

## 2023-05-04 DIAGNOSIS — K219 Gastro-esophageal reflux disease without esophagitis: Secondary | ICD-10-CM

## 2023-05-04 DIAGNOSIS — F5101 Primary insomnia: Secondary | ICD-10-CM

## 2023-05-04 DIAGNOSIS — Z6826 Body mass index (BMI) 26.0-26.9, adult: Secondary | ICD-10-CM

## 2023-05-04 DIAGNOSIS — Z79891 Long term (current) use of opiate analgesic: Secondary | ICD-10-CM | POA: Diagnosis not present

## 2023-05-04 DIAGNOSIS — Z6832 Body mass index (BMI) 32.0-32.9, adult: Secondary | ICD-10-CM

## 2023-05-04 DIAGNOSIS — I10 Essential (primary) hypertension: Secondary | ICD-10-CM | POA: Diagnosis not present

## 2023-05-04 DIAGNOSIS — R030 Elevated blood-pressure reading, without diagnosis of hypertension: Secondary | ICD-10-CM

## 2023-05-04 DIAGNOSIS — F411 Generalized anxiety disorder: Secondary | ICD-10-CM | POA: Diagnosis not present

## 2023-05-04 MED ORDER — HYDROCHLOROTHIAZIDE 12.5 MG PO CAPS
12.5000 mg | ORAL_CAPSULE | Freq: Every day | ORAL | 1 refills | Status: DC
Start: 2023-05-04 — End: 2023-10-19

## 2023-05-04 MED ORDER — ALPRAZOLAM 0.25 MG PO TABS: ORAL_TABLET | ORAL | 2 refills | Status: DC

## 2023-05-04 MED ORDER — PANTOPRAZOLE SODIUM 40 MG PO TBEC: 40.0000 mg | DELAYED_RELEASE_TABLET | Freq: Every day | ORAL | 1 refills | Status: DC

## 2023-05-04 MED ORDER — ZOLPIDEM TARTRATE 10 MG PO TABS: 10.0000 mg | ORAL_TABLET | Freq: Every day | ORAL | 5 refills | Status: DC

## 2023-05-04 MED ORDER — FAMOTIDINE 40 MG PO TABS: 40.0000 mg | ORAL_TABLET | Freq: Two times a day (BID) | ORAL | 5 refills | Status: DC

## 2023-05-04 MED ORDER — MONTELUKAST SODIUM 10 MG PO TABS
ORAL_TABLET | ORAL | 1 refills | Status: DC
Start: 2023-05-04 — End: 2023-07-14

## 2023-05-04 MED ORDER — VILAZODONE HCL 20 MG PO TABS
1.0000 | ORAL_TABLET | Freq: Every day | ORAL | 1 refills | Status: DC
Start: 2023-05-04 — End: 2023-10-19
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
## 2023-05-04 NOTE — Patient Instructions (Signed)

## 2023-05-05 LAB — CBC WITH DIFFERENTIAL/PLATELET
Basophils Absolute: 0.1 10*3/uL (ref 0.0–0.2)
Basos: 1 %
EOS (ABSOLUTE): 0.1 10*3/uL (ref 0.0–0.4)
Eos: 1 %
Hematocrit: 37.6 % (ref 34.0–46.6)
Hemoglobin: 12.7 g/dL (ref 11.1–15.9)
Immature Grans (Abs): 0 10*3/uL (ref 0.0–0.1)
Immature Granulocytes: 0 %
Lymphocytes Absolute: 2.4 10*3/uL (ref 0.7–3.1)
Lymphs: 31 %
MCH: 29.5 pg (ref 26.6–33.0)
MCHC: 33.8 g/dL (ref 31.5–35.7)
MCV: 87 fL (ref 79–97)
Monocytes Absolute: 0.4 10*3/uL (ref 0.1–0.9)
Monocytes: 5 %
Neutrophils Absolute: 4.7 10*3/uL (ref 1.4–7.0)
Neutrophils: 62 %
Platelets: 332 10*3/uL (ref 150–450)
RBC: 4.3 x10E6/uL (ref 3.77–5.28)
RDW: 13.2 % (ref 11.7–15.4)
WBC: 7.7 10*3/uL (ref 3.4–10.8)

## 2023-05-05 LAB — CMP14+EGFR
ALT: 14 IU/L (ref 0–32)
AST: 16 IU/L (ref 0–40)
Albumin/Globulin Ratio: 1.6
Albumin: 4.1 g/dL (ref 3.9–4.9)
Alkaline Phosphatase: 71 IU/L (ref 44–121)
BUN/Creatinine Ratio: 16 (ref 9–23)
BUN: 12 mg/dL (ref 6–24)
Bilirubin Total: 0.4 mg/dL (ref 0.0–1.2)
CO2: 23 mmol/L (ref 20–29)
Calcium: 9.1 mg/dL (ref 8.7–10.2)
Chloride: 101 mmol/L (ref 96–106)
Creatinine, Ser: 0.76 mg/dL (ref 0.57–1.00)
Globulin, Total: 2.6 g/dL (ref 1.5–4.5)
Glucose: 87 mg/dL (ref 70–99)
Potassium: 3.9 mmol/L (ref 3.5–5.2)
Sodium: 136 mmol/L (ref 134–144)
Total Protein: 6.7 g/dL (ref 6.0–8.5)
eGFR: 97 mL/min/{1.73_m2} (ref >59)

## 2023-05-05 LAB — LIPID PANEL
Chol/HDL Ratio: 4 ratio (ref 0.0–4.4)
Cholesterol, Total: 261 mg/dL — ABNORMAL HIGH (ref 100–199)
HDL: 66 mg/dL (ref >39)
LDL Chol Calc (NIH): 183 mg/dL — ABNORMAL HIGH (ref 0–99)
Triglycerides: 72 mg/dL (ref 0–149)
VLDL Cholesterol Cal: 12 mg/dL (ref 5–40)

## 2023-05-06 LAB — TOXASSURE SELECT 13 (MW), URINE

## 2023-05-08 LAB — VITAMIN B12

## 2023-05-09 LAB — SPECIMEN STATUS REPORT

## 2023-05-15 ENCOUNTER — Ambulatory Visit (INDEPENDENT_AMBULATORY_CARE_PROVIDER_SITE_OTHER): Payer: BC Managed Care – PPO | Admitting: *Deleted

## 2023-05-15 DIAGNOSIS — E538 Deficiency of other specified B group vitamins: Secondary | ICD-10-CM | POA: Diagnosis not present

## 2023-05-15 MED ORDER — CYANOCOBALAMIN 1000 MCG/ML IJ SOLN
1000.0000 ug | Freq: Once | INTRAMUSCULAR | Status: AC
Start: 2023-05-15 — End: 2023-05-15
  Administered 2023-05-15: 1000 ug via INTRAMUSCULAR

## 2023-05-21 ENCOUNTER — Ambulatory Visit: Payer: BC Managed Care – PPO

## 2023-05-22 ENCOUNTER — Ambulatory Visit (INDEPENDENT_AMBULATORY_CARE_PROVIDER_SITE_OTHER): Payer: BC Managed Care – PPO

## 2023-05-22 DIAGNOSIS — E538 Deficiency of other specified B group vitamins: Secondary | ICD-10-CM | POA: Diagnosis not present

## 2023-05-22 MED ORDER — CYANOCOBALAMIN 1000 MCG/ML IJ SOLN
1000.0000 ug | Freq: Once | INTRAMUSCULAR | Status: AC
Start: 2023-05-22 — End: 2023-05-22
  Administered 2023-05-22: 1000 ug via INTRAMUSCULAR

## 2023-05-22 NOTE — Progress Notes (Signed)
Cyanocobalamin injection given to left deltoid.  Patient tolerated well. 

## 2023-05-29 ENCOUNTER — Ambulatory Visit (INDEPENDENT_AMBULATORY_CARE_PROVIDER_SITE_OTHER): Payer: BC Managed Care – PPO

## 2023-05-29 DIAGNOSIS — E538 Deficiency of other specified B group vitamins: Secondary | ICD-10-CM | POA: Diagnosis not present

## 2023-05-29 MED ORDER — CYANOCOBALAMIN 1000 MCG/ML IJ SOLN
1000.0000 ug | Freq: Once | INTRAMUSCULAR | Status: AC
Start: 2023-05-29 — End: 2023-05-29
  Administered 2023-05-29: 1000 ug via INTRAMUSCULAR

## 2023-05-30 ENCOUNTER — Other Ambulatory Visit: Payer: Self-pay | Admitting: Nurse Practitioner

## 2023-06-05 ENCOUNTER — Ambulatory Visit (INDEPENDENT_AMBULATORY_CARE_PROVIDER_SITE_OTHER): Payer: BC Managed Care – PPO

## 2023-06-05 DIAGNOSIS — E538 Deficiency of other specified B group vitamins: Secondary | ICD-10-CM

## 2023-06-05 MED ORDER — CYANOCOBALAMIN 1000 MCG/ML IJ SOLN
1000.0000 ug | Freq: Once | INTRAMUSCULAR | Status: AC
Start: 2023-06-05 — End: 2023-06-05
  Administered 2023-06-05: 1000 ug via INTRAMUSCULAR

## 2023-06-05 NOTE — Progress Notes (Signed)
B12 injection given left arm - patient tolerated well 

## 2023-06-08 ENCOUNTER — Telehealth: Payer: BC Managed Care – PPO | Admitting: Nurse Practitioner

## 2023-06-08 ENCOUNTER — Encounter: Payer: Self-pay | Admitting: Nurse Practitioner

## 2023-06-08 DIAGNOSIS — L299 Pruritus, unspecified: Secondary | ICD-10-CM

## 2023-06-08 MED ORDER — PREDNISONE 20 MG PO TABS: 40.0000 mg | ORAL_TABLET | Freq: Every day | ORAL | 0 refills | Status: AC

## 2023-06-08 NOTE — Patient Instructions (Signed)
Pruritus Pruritus is an itchy feeling on the skin. One of the most common causes is dry skin, but many different things can cause itching. Most cases of itching do not require medical attention. Sometimes itchy skin can turn into a rash or a secondary infection. Follow these instructions at home: Skin care  Do not use scented soaps, detergents, perfumes, and cosmetic products. Instead, use gentle, unscented versions of these items. Apply moisturizing creams to your skin frequently, at least twice daily. Apply immediately after bathing while skin is still wet. Take medicines or apply medicated creams only as told by your health care provider. This may include: Corticosteroid cream or topical calcineurin inhibitor. Anti-itch lotions containing urea, camphor, or menthol. Oral antihistamines. Do not take hot showers or baths, which can make itching worse. A short, cool shower may help with itching as long as you apply moisturizing lotion after the shower. Apply a cool, wet cloth (cool compress) to the affected areas. You may take lukewarm baths with one of the following: Epsom salts. You can get these at your local pharmacy or grocery store. Follow the instructions on the packaging. Baking soda. Pour a small amount into the bath as told by your health care provider. Colloidal oatmeal. You can get this at your local pharmacy or grocery store. Follow the instructions on the packaging. Do not scratch your skin. General instructions Avoid wearing tight clothes. Keep a journal to help find out what is causing your itching. Write down: What you eat and drink. What cosmetic products you use. What soaps or detergents you use. What you wear, including jewelry. Use a humidifier. This keeps the air moist, which helps to prevent dry skin. Be aware of any changes in your itchiness. Tell your health care provider about any changes. Contact a health care provider if: The itching does not go away after  several days. You notice redness, warmth, or drainage on the skin where you have scratched. You are unusually thirsty or urinating more than normal. Your skin tingles or feels numb. Your skin or the white parts of your eyes turn yellow (jaundice). You feel weak. You have any of the following: Night sweats. Tiredness (fatigue). Weight loss. Abdominal pain. Summary Pruritus is an itchy feeling on the skin. One of the most common causes is dry skin, but many different conditions and factors can cause itching. Apply moisturizing creams to your skin frequently, at least twice daily. Apply immediately after bathing while skin is still wet. Take medicines or apply medicated creams only as told by your health care provider. Do not take hot showers or baths. Do not use scented soaps, detergents, perfumes, or cosmetic products. Keep a journal to help find out what is causing your itching. This information is not intended to replace advice given to you by your health care provider. Make sure you discuss any questions you have with your health care provider. Document Revised: 12/18/2021 Document Reviewed: 12/18/2021 Elsevier Patient Education  2024 [employer].

## 2023-06-08 NOTE — Progress Notes (Signed)
Virtual Visit Consent   Christine [name], you are scheduled for a virtual visit with Mary-Margaret Daphine Deutscher, FNP, a [facility] provider, today.     Just as with appointments in the office, your consent must be obtained to participate.  Your consent will be active for this visit and any virtual visit you may have with one of our providers in the next 365 days.     If you have a MyChart account, a copy of this consent can be sent to you electronically.  All virtual visits are billed to your insurance company just like a traditional visit in the office.    As this is a virtual visit, video technology does not allow for your provider to perform a traditional examination.  This may limit your provider's ability to fully assess your condition.  If your provider identifies any concerns that need to be evaluated in person or the need to arrange testing (such as labs, EKG, etc.), we will make arrangements to do so.     Although advances in technology are sophisticated, we cannot ensure that it will always work on either your end or our end.  If the connection with a video visit is poor, the visit may have to be switched to a telephone visit.  With either a video or telephone visit, we are not always able to ensure that we have a secure connection.     I need to obtain your verbal consent now.   Are you willing to proceed with your visit today? YES   Christine [name] has provided verbal consent on 06/08/2023 for a virtual visit (video or telephone).   Video cut off at beginning of visit  Mary-Margaret Daphine Deutscher, FNP   Date: 06/08/2023 9:50 AM   Virtual Visit via Video Note   I, Mary-Margaret Daphine Deutscher, connected with Christine [name] ([id number], [birth year]) on 06/08/23 at  3:50 PM EDT by a video-enabled telemedicine application and verified that I am speaking with the correct person using two identifiers.  Location: Patient: Virtual Visit Location Patient: Mobile Provider: Virtual Visit Location Provider:  Mobile   I discussed the limitations of evaluation and management by telemedicine and the availability of in person appointments. The patient expressed understanding and agreed to proceed.    History of Present Illness: Christine [name] is a 47 y.o. who identifies as a female who was assigned female at birth, and is being seen today for itching.  HPI: Patient c/o itching. She has a very fine rash that you can hardly see. Still itching some. She has been taking zyrtec which has helped. Mainly from neck to hips and is scattered.     Review of Systems  Constitutional:  Negative for diaphoresis and weight loss.  Eyes:  Negative for blurred vision, double vision and pain.  Respiratory:  Negative for shortness of breath.   Cardiovascular:  Negative for chest pain, palpitations, orthopnea and leg swelling.  Gastrointestinal:  Negative for abdominal pain.  Skin:  Negative for rash.  Neurological:  Negative for dizziness, sensory change, loss of consciousness, weakness and headaches.  Endo/Heme/Allergies:  Negative for polydipsia. Does not bruise/bleed easily.  Psychiatric/Behavioral:  Negative for memory loss. The patient does not have insomnia.   All other systems reviewed and are negative.   Problems:  Patient Active Problem List   Diagnosis Date Noted   B12 deficiency 05/15/2023   Allergy to alpha-gal 10/27/2022   Primary hypertension 06/13/2021   Weight gain due to medication 09/13/2020  Insomnia 04/27/2020   Gastroesophageal reflux disease 09/13/2018   TMJ pain dysfunction syndrome 09/25/2017   Allergic rhinitis 02/18/2016   White coat syndrome with high blood pressure without hypertension 09/06/2015   BMI 32.0-32.9,adult 09/06/2015   Mammographic breast lesion 11/24/2014   Mild persistent asthma, uncomplicated 02/10/2013   Generalized anxiety disorder 02/10/2013   Vitamin D deficiency     Allergies:  Allergies  Allergen Reactions   Sulfa Antibiotics Hives   Sulfasalazine  Hives   Alpha-Gal    Doxycycline Hyclate    Ivp Dye [Iodinated Contrast Media] Hives   Other     ALPHA GAL - MEAT ALLERGY    Paxil [Paroxetine Hcl] Other (See Comments)    Made pt very angry    Phosphorus Diarrhea   Medications:  Current Outpatient Medications:    albuterol (PROVENTIL) (2.5 MG/3ML) 0.083% nebulizer solution, Take 3 mLs (2.5 mg total) by nebulization every 4 (four) hours as needed for wheezing or shortness of breath., Disp: 75 mL, Rfl: 1   ALPRAZolam (XANAX) 0.25 MG tablet, 1 po daily prn, Disp: 20 tablet, Rfl: 2   Cholecalciferol (VITAMIN D3) 1000 units CAPS, Take by mouth., Disp: , Rfl:    EPINEPHrine (EPIPEN 2-PAK) 0.3 mg/0.3 mL IJ SOAJ injection, Inject 0.3 mg into the muscle as needed for anaphylaxis., Disp: 2 each, Rfl: 2   ethynodiol-ethinyl estradiol (KELNOR 1/35) 1-35 MG-MCG tablet, TAKE 1 TABLET BY MOUTH EVERY DAY, Disp: 84 tablet, Rfl: 1   famotidine (PEPCID) 40 MG tablet, Take 1 tablet (40 mg total) by mouth in the morning and at bedtime., Disp: 60 tablet, Rfl: 5   Fluticasone Propionate (XHANCE) 93 MCG/ACT EXHU, Place 2 sprays into the nose 2 (two) times daily., Disp: 32 mL, Rfl: 5   hydrochlorothiazide (MICROZIDE) 12.5 MG capsule, Take 1 capsule (12.5 mg total) by mouth daily., Disp: 90 capsule, Rfl: 1   montelukast (SINGULAIR) 10 MG tablet, TAKE 1 TABLET BY MOUTH EVERYDAY AT BEDTIME, Disp: 90 tablet, Rfl: 1   pantoprazole (PROTONIX) 40 MG tablet, Take 1 tablet (40 mg total) by mouth daily., Disp: 90 tablet, Rfl: 1   Tiotropium Bromide-Olodaterol (STIOLTO RESPIMAT) 2.5-2.5 MCG/ACT AERS, Inhale 2 puffs into the lungs in the morning and at bedtime., Disp: 4 g, Rfl: 5   VENTOLIN HFA 108 (90 Base) MCG/ACT inhaler, TAKE 2 PUFFS BY MOUTH EVERY 6 HOURS AS NEEDED FOR WHEEZE OR SHORTNESS OF BREATH, Disp: 18 each, Rfl: 2   Vilazodone HCl (VIIBRYD) 20 MG TABS, Take 1 tablet (20 mg total) by mouth daily., Disp: 90 tablet, Rfl: 1   zolpidem (AMBIEN) 10 MG tablet, Take 1  tablet (10 mg total) by mouth at bedtime., Disp: 30 tablet, Rfl: 5  Observations/Objective: Patient is well-developed, well-nourished in no acute distress.  Resting comfortably  at home.  Head is normocephalic, atraumatic.  No labored breathing.  Speech is clear and coherent with logical content.  Patient is alert and oriented at baseline.  Not able to visualize rash  Assessment and Plan:  Christine [name] in today with chief complaint of itching   1. Pruritus Avoid scratching Cool compresses Continue zyrtec daily  Meds ordered this encounter  Medications   predniSONE (DELTASONE) 20 MG tablet    Sig: Take 2 tablets (40 mg total) by mouth daily with breakfast for 5 days. 2 po daily for 5 days    Dispense:  10 tablet    Refill:  0    Order Specific Question:   Supervising Provider  Answer:   Arville Care A [[id number]]     Follow Up Instructions: I discussed the assessment and treatment plan with the patient. The patient was provided an opportunity to ask questions and all were answered. The patient agreed with the plan and demonstrated an understanding of the instructions.  A copy of instructions were sent to the patient via MyChart.  The patient was advised to call back or seek an in-person evaluation if the symptoms worsen or if the condition fails to improve as anticipated.  Time:  I spent 6 minutes with the patient via telehealth technology discussing the above problems/concerns.    Mary-Margaret Daphine Deutscher, FNP

## 2023-06-19 ENCOUNTER — Ambulatory Visit (INDEPENDENT_AMBULATORY_CARE_PROVIDER_SITE_OTHER): Payer: BC Managed Care – PPO

## 2023-06-19 DIAGNOSIS — E538 Deficiency of other specified B group vitamins: Secondary | ICD-10-CM

## 2023-06-19 MED ORDER — CYANOCOBALAMIN 1000 MCG/ML IJ SOLN
1000.0000 ug | Freq: Once | INTRAMUSCULAR | Status: AC
Start: 2023-06-19 — End: 2023-06-19
  Administered 2023-06-19: 1000 ug via INTRAMUSCULAR

## 2023-06-19 NOTE — Progress Notes (Signed)
Cyanocobalamin injection given to right deltoid.  Patient tolerated well. 

## 2023-06-20 DIAGNOSIS — F321 Major depressive disorder, single episode, moderate: Secondary | ICD-10-CM | POA: Diagnosis not present

## 2023-07-02 ENCOUNTER — Ambulatory Visit: Payer: BC Managed Care – PPO | Admitting: Nurse Practitioner

## 2023-07-03 ENCOUNTER — Ambulatory Visit (INDEPENDENT_AMBULATORY_CARE_PROVIDER_SITE_OTHER): Payer: BC Managed Care – PPO

## 2023-07-03 ENCOUNTER — Encounter: Payer: Self-pay | Admitting: Family Medicine

## 2023-07-03 ENCOUNTER — Ambulatory Visit: Payer: BC Managed Care – PPO | Admitting: Family Medicine

## 2023-07-03 ENCOUNTER — Ambulatory Visit: Payer: BC Managed Care – PPO

## 2023-07-03 VITALS — BP 135/87 | HR 79 | Temp 97.0°F | Resp 20 | Ht 64.0 in | Wt 152.4 lb

## 2023-07-03 DIAGNOSIS — E538 Deficiency of other specified B group vitamins: Secondary | ICD-10-CM

## 2023-07-03 DIAGNOSIS — K5900 Constipation, unspecified: Secondary | ICD-10-CM

## 2023-07-03 DIAGNOSIS — R1084 Generalized abdominal pain: Secondary | ICD-10-CM | POA: Diagnosis not present

## 2023-07-03 DIAGNOSIS — R109 Unspecified abdominal pain: Secondary | ICD-10-CM | POA: Diagnosis not present

## 2023-07-03 DIAGNOSIS — I878 Other specified disorders of veins: Secondary | ICD-10-CM | POA: Diagnosis not present

## 2023-07-03 MED ORDER — CYANOCOBALAMIN 1000 MCG/ML IJ SOLN
1000.0000 ug | Freq: Once | INTRAMUSCULAR | Status: AC
Start: 2023-07-03 — End: 2023-07-03
  Administered 2023-07-03: 1000 ug via INTRAMUSCULAR

## 2023-07-03 MED ORDER — POLYETHYLENE GLYCOL 3350 17 GM/SCOOP PO POWD
17.0000 g | Freq: Every day | ORAL | 1 refills | Status: AC
Start: 2023-07-03 — End: ?

## 2023-07-03 NOTE — Patient Instructions (Signed)
Thank you for coming in to clinic today.  1. Your symptoms are consistent with Constipation, likely cause of your General Abdominal Pain / Cramping. 2. Start with Miralax, prescription was sent to pharmacy. First dose 68g (4 capfuls) in 32oz water over 1 to 2 hours for clean out. Next day start 17g or 1 capful daily, may adjust dose up or down by half a capful every few days. Recommend to take this medicine daily for next 1-2 weeks, you may need to use it longer if needed. - Goal is to have soft regular bowel movement 1-3x daily, if too runny or diarrhea, then reduce dose of the medicine to every other day.  Improve water intake, hydration will help Also recommend increased vegetables, fruits, fiber intake Can try daily Metamucil or Fiber supplement at pharmacy over the counter  Follow-up if symptoms are not improving with bowel movements, or if pain worsens, develop fevers, nausea, vomiting.  Please schedule a follow-up appointment with Michelle Rakes, FNP, in 1 month to follow-up Constipation  If you have any other questions or concerns, please feel free to call the clinic to contact me. You may also schedule an earlier appointment if necessary.  However, if your symptoms get significantly worse, please go to the Emergency Department to seek immediate medical attention.  

## 2023-07-03 NOTE — Progress Notes (Signed)
Subjective:  Patient ID: Christine [name], female    DOB: [birth year], 47 y.o.   MRN: [id number]  Patient Care Team: Bennie Pierini, FNP as PCP - General (Nurse Practitioner)   Chief Complaint:  Abdominal Pain   HPI: Christine [name] is a 47 y.o. female presenting on 07/03/2023 for Abdominal Pain   Abdominal Pain This is a new problem. The current episode started in the past 7 days. The onset quality is gradual. The problem occurs intermittently. The problem has been waxing and waning. The pain is located in the generalized abdominal region. The pain is moderate. The quality of the pain is cramping and colicky. The abdominal pain does not radiate. Associated symptoms include constipation, diarrhea and nausea. Pertinent negatives include no fever. Nothing aggravates the pain. The pain is relieved by Nothing. She has tried nothing for the symptoms. Her past medical history is significant for irritable bowel syndrome.     Relevant past medical, surgical, family, and social history reviewed and updated as indicated.  Allergies and medications reviewed and updated. Data reviewed: Chart in Epic.   Past Medical History:  Diagnosis Date   Asthma    GAD (generalized anxiety disorder)    Vitamin D deficiency     Past Surgical History:  Procedure Laterality Date   BREAST BIOPSY Right 07/2015   fibrocystic changes   CESAREAN SECTION     TONSILLECTOMY AND ADENOIDECTOMY      Social History   Socioeconomic History   Marital status: Married    Spouse name: Not on file   Number of children: Not on file   Years of education: Not on file   Highest education level: Associate degree: academic program  Occupational History   Not on file  Tobacco Use   Smoking status: Never   Smokeless tobacco: Never  Vaping Use   Vaping status: Never Used  Substance and Sexual Activity   Alcohol use: Yes   Drug use: No   Sexual activity: Not on file  Other Topics Concern   Not on file   Social History Narrative   Not on file   Social Determinants of Health   Financial Resource Strain: Low Risk  (04/30/2023)   Overall Financial Resource Strain (CARDIA)    Difficulty of Paying Living Expenses: Not very hard  Food Insecurity: No Food Insecurity (04/30/2023)   Hunger Vital Sign    Worried About Running Out of Food in the Last Year: Never true    Ran Out of Food in the Last Year: Never true  Transportation Needs: No Transportation Needs (04/30/2023)   PRAPARE - Administrator, Civil Service (Medical): No    Lack of Transportation (Non-Medical): No  Physical Activity: Unknown (04/30/2023)   Exercise Vital Sign    Days of Exercise per Week: 0 days    Minutes of Exercise per Session: Not on file  Stress: Stress Concern Present (04/30/2023)   [employer] of Occupational Health - Occupational Stress Questionnaire    Feeling of Stress : Rather much  Social Connections: Moderately Isolated (04/30/2023)   Social Connection and Isolation Panel [NHANES]    Frequency of Communication with Friends and Family: Twice a week    Frequency of Social Gatherings with Friends and Family: Once a week    Attends Religious Services: Never    Database administrator or Organizations: No    Attends Engineer, structural: Not on file    Marital Status: Married  Intimate  Partner Violence: Not on file    Outpatient Encounter Medications as of 07/03/2023  Medication Sig   albuterol (PROVENTIL) (2.5 MG/3ML) 0.083% nebulizer solution Take 3 mLs (2.5 mg total) by nebulization every 4 (four) hours as needed for wheezing or shortness of breath.   ALPRAZolam (XANAX) 0.25 MG tablet 1 po daily prn   Cholecalciferol (VITAMIN D3) 1000 units CAPS Take by mouth.   Digestive Enzymes (DIGESTIVE ENZYME PO) Take 3 each by mouth daily.   EPINEPHrine (EPIPEN 2-PAK) 0.3 mg/0.3 mL IJ SOAJ injection Inject 0.3 mg into the muscle as needed for anaphylaxis.   ethynodiol-ethinyl estradiol (KELNOR 1/35)  1-35 MG-MCG tablet TAKE 1 TABLET BY MOUTH EVERY DAY   famotidine (PEPCID) 40 MG tablet Take 1 tablet (40 mg total) by mouth in the morning and at bedtime. (Patient taking differently: Take 40 mg by mouth 2 (two) times daily between meals as needed.)   Fluticasone Propionate (XHANCE) 93 MCG/ACT EXHU Place 2 sprays into the nose 2 (two) times daily.   montelukast (SINGULAIR) 10 MG tablet TAKE 1 TABLET BY MOUTH EVERYDAY AT BEDTIME   pantoprazole (PROTONIX) 40 MG tablet Take 1 tablet (40 mg total) by mouth daily.   polyethylene glycol powder (GLYCOLAX/MIRALAX) 17 GM/SCOOP powder Take 17 g by mouth daily.   Tiotropium Bromide-Olodaterol (STIOLTO RESPIMAT) 2.5-2.5 MCG/ACT AERS Inhale 2 puffs into the lungs in the morning and at bedtime.   VENTOLIN HFA 108 (90 Base) MCG/ACT inhaler TAKE 2 PUFFS BY MOUTH EVERY 6 HOURS AS NEEDED FOR WHEEZE OR SHORTNESS OF BREATH   Vilazodone HCl (VIIBRYD) 20 MG TABS Take 1 tablet (20 mg total) by mouth daily.   zolpidem (AMBIEN) 10 MG tablet Take 1 tablet (10 mg total) by mouth at bedtime.   hydrochlorothiazide (MICROZIDE) 12.5 MG capsule Take 1 capsule (12.5 mg total) by mouth daily. (Patient not taking: Reported on 07/03/2023)   [EXPIRED] cyanocobalamin (VITAMIN B12) injection 1,000 mcg    No facility-administered encounter medications on file as of 07/03/2023.    Allergies  Allergen Reactions   Sulfa Antibiotics Hives   Sulfasalazine Hives   Alpha-Gal    Doxycycline Hyclate    Ivp Dye [Iodinated Contrast Media] Hives   Other     ALPHA GAL - MEAT ALLERGY    Paxil [Paroxetine Hcl] Other (See Comments)    Made pt very angry    Phosphorus Diarrhea    Review of Systems  Constitutional:  Negative for activity change, appetite change, chills, diaphoresis, fatigue, fever and unexpected weight change.  Eyes:  Negative for photophobia and visual disturbance.  Cardiovascular:  Negative for chest pain, palpitations and leg swelling.  Gastrointestinal:  Positive for  abdominal pain, constipation, diarrhea and nausea.  Genitourinary:  Negative for decreased urine volume and difficulty urinating.  All other systems reviewed and are negative.       Objective:  BP 135/87   Pulse 79   Temp (!) 97 F (36.1 C) (Oral)   Resp 20   Ht 5\' 4"  (1.626 m)   Wt 152 lb 6 oz (69.1 kg)   SpO2 99%   BMI 26.16 kg/m    Wt Readings from Last 3 Encounters:  07/03/23 152 lb 6 oz (69.1 kg)  05/04/23 152 lb (68.9 kg)  10/27/22 152 lb 3.2 oz (69 kg)    Physical Exam Vitals and nursing note reviewed.  Constitutional:      General: She is not in acute distress.    Appearance: Normal appearance. She is well-developed. She  is not ill-appearing, toxic-appearing or diaphoretic.  HENT:     Head: Normocephalic and atraumatic.     Mouth/Throat:     Mouth: Mucous membranes are moist.     Pharynx: Oropharynx is clear.  Eyes:     Extraocular Movements: Extraocular movements intact.     Pupils: Pupils are equal, round, and reactive to light.  Cardiovascular:     Rate and Rhythm: Normal rate and regular rhythm.  Pulmonary:     Effort: Pulmonary effort is normal.     Breath sounds: Normal breath sounds.  Abdominal:     General: Bowel sounds are normal. There is no distension or abdominal bruit. There are no signs of injury.     Palpations: There is no mass.     Tenderness: There is generalized abdominal tenderness. There is no right CVA tenderness, left CVA tenderness, guarding or rebound. Negative signs include Murphy's sign, Rovsing's sign, McBurney's sign, psoas sign and obturator sign.     Hernia: No hernia is present.  Skin:    General: Skin is warm and dry.     Capillary Refill: Capillary refill takes less than 2 seconds.  Neurological:     General: No focal deficit present.     Mental Status: She is alert and oriented to person, place, and time.  Psychiatric:        Mood and Affect: Mood normal.        Behavior: Behavior normal.        Thought Content:  Thought content normal.        Judgment: Judgment normal.     Results for orders placed or performed in visit on 05/04/23  ToxASSURE Select 13 (MW), Urine  Result Value Ref Range   Summary Note   CBC with Differential/Platelet  Result Value Ref Range   WBC 7.7 3.4 - 10.8 x10E3/uL   RBC 4.30 3.77 - 5.28 x10E6/uL   Hemoglobin 12.7 11.1 - 15.9 g/dL   Hematocrit 29.5 62.1 - 46.6 %   MCV 87 79 - 97 fL   MCH 29.5 26.6 - 33.0 pg   MCHC 33.8 31.5 - 35.7 g/dL   RDW 30.8 65.7 - 84.6 %   Platelets 332 150 - 450 x10E3/uL   Neutrophils 62 Not Estab. %   Lymphs 31 Not Estab. %   Monocytes 5 Not Estab. %   Eos 1 Not Estab. %   Basos 1 Not Estab. %   Neutrophils Absolute 4.7 1.4 - 7.0 x10E3/uL   Lymphocytes Absolute 2.4 0.7 - 3.1 x10E3/uL   Monocytes Absolute 0.4 0.1 - 0.9 x10E3/uL   EOS (ABSOLUTE) 0.1 0.0 - 0.4 x10E3/uL   Basophils Absolute 0.1 0.0 - 0.2 x10E3/uL   Immature Granulocytes 0 Not Estab. %   Immature Grans (Abs) 0.0 0.0 - 0.1 x10E3/uL  CMP14+EGFR  Result Value Ref Range   Glucose 87 70 - 99 mg/dL   BUN 12 6 - 24 mg/dL   Creatinine, Ser 9.62 0.57 - 1.00 mg/dL   eGFR 97 >95 MW/UXL/2.44   BUN/Creatinine Ratio 16 9 - 23   Sodium 136 134 - 144 mmol/L   Potassium 3.9 3.5 - 5.2 mmol/L   Chloride 101 96 - 106 mmol/L   CO2 23 20 - 29 mmol/L   Calcium 9.1 8.7 - 10.2 mg/dL   Total Protein 6.7 6.0 - 8.5 g/dL   Albumin 4.1 3.9 - 4.9 g/dL   Globulin, Total 2.6 1.5 - 4.5 g/dL   Albumin/Globulin Ratio 1.6  Bilirubin Total 0.4 0.0 - 1.2 mg/dL   Alkaline Phosphatase 71 44 - 121 IU/L   AST 16 0 - 40 IU/L   ALT 14 0 - 32 IU/L  Lipid panel  Result Value Ref Range   Cholesterol, Total 261 (H) 100 - 199 mg/dL   Triglycerides 72 0 - 149 mg/dL   HDL 66 >09 mg/dL   VLDL Cholesterol Cal 12 5 - 40 mg/dL   LDL Chol Calc (NIH) 811 (H) 0 - 99 mg/dL   LDL CALC COMMENT: CANCELED    Chol/HDL Ratio 4.0 0.0 - 4.4 ratio  Vitamin B12  Result Value Ref Range   Vitamin B-12 213 (L) 232 - 1,245  pg/mL  Specimen status report  Result Value Ref Range   specimen status report Comment      X-Ray: KUB: moderate stool burden. No acute findings. Preliminary x-ray reading by Kari Baars, FNP-C, [facility].   Pertinent labs & imaging results that were available during my care of the patient were reviewed by me and considered in my medical decision making.  Assessment & Plan:  Mackynzie was seen today for abdominal pain.  Diagnoses and all orders for this visit:  Generalized abdominal pain Constipation in female  Miralax clean out discussed in detail. Aware of red flags. Report new worsening, or persistent symptoms. Increase water and fiber intake.  -     DG Abd 1 View -     polyethylene glycol powder (GLYCOLAX/MIRALAX) 17 GM/SCOOP powder; Take 17 g by mouth daily.  B12 deficiency -     cyanocobalamin (VITAMIN B12) injection 1,000 mcg     Continue all other maintenance medications.  Follow up plan: Return if symptoms worsen or fail to improve.   Continue healthy lifestyle choices, including diet (rich in fruits, vegetables, and lean proteins, and low in salt and simple carbohydrates) and exercise (at least 30 minutes of moderate physical activity daily).  Educational handout given for constipation   The above assessment and management plan was discussed with the patient. The patient verbalized understanding of and has agreed to the management plan. Patient is aware to call the clinic if they develop any new symptoms or if symptoms persist or worsen. Patient is aware when to return to the clinic for a follow-up visit. Patient educated on when it is appropriate to go to the emergency department.   Kari Baars, FNP-C [facility] [phone]

## 2023-07-06 ENCOUNTER — Encounter: Payer: Self-pay | Admitting: Family Medicine

## 2023-07-07 NOTE — Telephone Encounter (Signed)
That is fine just remind [place]

## 2023-07-11 DIAGNOSIS — F321 Major depressive disorder, single episode, moderate: Secondary | ICD-10-CM | POA: Diagnosis not present

## 2023-07-14 ENCOUNTER — Other Ambulatory Visit: Payer: Self-pay | Admitting: Nurse Practitioner

## 2023-07-14 DIAGNOSIS — J4530 Mild persistent asthma, uncomplicated: Secondary | ICD-10-CM

## 2023-07-20 ENCOUNTER — Encounter: Payer: Self-pay | Admitting: Family Medicine

## 2023-07-21 ENCOUNTER — Ambulatory Visit: Payer: BC Managed Care – PPO

## 2023-07-30 ENCOUNTER — Ambulatory Visit: Payer: BC Managed Care – PPO

## 2023-07-31 ENCOUNTER — Telehealth: Payer: Self-pay

## 2023-07-31 ENCOUNTER — Ambulatory Visit (INDEPENDENT_AMBULATORY_CARE_PROVIDER_SITE_OTHER): Payer: BC Managed Care – PPO

## 2023-07-31 ENCOUNTER — Other Ambulatory Visit: Payer: Self-pay

## 2023-07-31 DIAGNOSIS — E538 Deficiency of other specified B group vitamins: Secondary | ICD-10-CM

## 2023-07-31 DIAGNOSIS — K582 Mixed irritable bowel syndrome: Secondary | ICD-10-CM | POA: Diagnosis not present

## 2023-07-31 MED ORDER — CYANOCOBALAMIN 1000 MCG/ML IJ SOLN
1000.0000 ug | Freq: Once | INTRAMUSCULAR | 3 refills | Status: DC
Start: 2023-07-31 — End: 2024-10-17

## 2023-07-31 MED ORDER — CYANOCOBALAMIN 1000 MCG/ML IJ SOLN
1000.0000 ug | Freq: Once | INTRAMUSCULAR | Status: AC
Start: 2023-07-31 — End: 2023-07-31
  Administered 2023-07-31: 1000 ug via INTRAMUSCULAR

## 2023-07-31 NOTE — Telephone Encounter (Signed)
Patient was here today for B12 injection.  She said she had spoken to you about doing these at home and you had told her to come in today for a demonstration and then you would send in the medication, needles, and syringes for her to do at home starting next month.  Her pharmacy is [facility] [place].

## 2023-07-31 NOTE — Progress Notes (Signed)
Cyanocobalamin injection given to left deltoid.  Patient tolerated well. 

## 2023-08-03 LAB — GLIA (IGA/G) + TTG IGA
Antigliadin Abs, IgA: 2 U (ref 0–19)
Gliadin IgG: 2 U (ref 0–19)
Transglutaminase IgA: <2 U/mL (ref 0–3)

## 2023-08-05 DIAGNOSIS — L299 Pruritus, unspecified: Secondary | ICD-10-CM | POA: Diagnosis not present

## 2023-08-05 DIAGNOSIS — L503 Dermatographic urticaria: Secondary | ICD-10-CM | POA: Diagnosis not present

## 2023-08-15 ENCOUNTER — Other Ambulatory Visit: Payer: Self-pay | Admitting: Nurse Practitioner

## 2023-08-15 DIAGNOSIS — J4530 Mild persistent asthma, uncomplicated: Secondary | ICD-10-CM

## 2023-08-15 DIAGNOSIS — F321 Major depressive disorder, single episode, moderate: Secondary | ICD-10-CM | POA: Diagnosis not present

## 2023-08-21 ENCOUNTER — Other Ambulatory Visit: Payer: Self-pay | Admitting: Nurse Practitioner

## 2023-08-21 DIAGNOSIS — J4530 Mild persistent asthma, uncomplicated: Secondary | ICD-10-CM

## 2023-09-19 ENCOUNTER — Other Ambulatory Visit: Payer: Self-pay | Admitting: Nurse Practitioner

## 2023-09-19 DIAGNOSIS — J4530 Mild persistent asthma, uncomplicated: Secondary | ICD-10-CM

## 2023-09-26 ENCOUNTER — Other Ambulatory Visit: Payer: Self-pay | Admitting: Nurse Practitioner

## 2023-09-26 DIAGNOSIS — J4530 Mild persistent asthma, uncomplicated: Secondary | ICD-10-CM

## 2023-10-15 ENCOUNTER — Ambulatory Visit: Payer: BC Managed Care – PPO | Admitting: Nurse Practitioner

## 2023-10-17 DIAGNOSIS — F321 Major depressive disorder, single episode, moderate: Secondary | ICD-10-CM | POA: Diagnosis not present

## 2023-10-19 ENCOUNTER — Encounter: Payer: Self-pay | Admitting: Nurse Practitioner

## 2023-10-19 ENCOUNTER — Ambulatory Visit: Payer: BC Managed Care – PPO | Admitting: Nurse Practitioner

## 2023-10-19 VITALS — BP 146/84 | HR 69 | Temp 98.2°F | Resp 20 | Ht 64.0 in | Wt 157.0 lb

## 2023-10-19 DIAGNOSIS — E538 Deficiency of other specified B group vitamins: Secondary | ICD-10-CM

## 2023-10-19 DIAGNOSIS — I10 Essential (primary) hypertension: Secondary | ICD-10-CM

## 2023-10-19 DIAGNOSIS — Z23 Encounter for immunization: Secondary | ICD-10-CM | POA: Diagnosis not present

## 2023-10-19 DIAGNOSIS — K219 Gastro-esophageal reflux disease without esophagitis: Secondary | ICD-10-CM | POA: Diagnosis not present

## 2023-10-19 DIAGNOSIS — J4530 Mild persistent asthma, uncomplicated: Secondary | ICD-10-CM

## 2023-10-19 DIAGNOSIS — F5101 Primary insomnia: Secondary | ICD-10-CM

## 2023-10-19 DIAGNOSIS — Z91018 Allergy to other foods: Secondary | ICD-10-CM

## 2023-10-19 DIAGNOSIS — Z6832 Body mass index (BMI) 32.0-32.9, adult: Secondary | ICD-10-CM

## 2023-10-19 DIAGNOSIS — F411 Generalized anxiety disorder: Secondary | ICD-10-CM

## 2023-10-19 MED ORDER — ZOLPIDEM TARTRATE 10 MG PO TABS: 10.0000 mg | ORAL_TABLET | Freq: Every day | ORAL | 5 refills | Status: DC

## 2023-10-19 MED ORDER — HYDROCHLOROTHIAZIDE 12.5 MG PO CAPS: 12.5000 mg | ORAL_CAPSULE | Freq: Every day | ORAL | 1 refills | Status: DC

## 2023-10-19 MED ORDER — VILAZODONE HCL 20 MG PO TABS: 1.0000 | ORAL_TABLET | Freq: Every day | ORAL | 1 refills | Status: DC

## 2023-10-19 MED ORDER — PANTOPRAZOLE SODIUM 40 MG PO TBEC
40.0000 mg | DELAYED_RELEASE_TABLET | Freq: Every day | ORAL | 1 refills | Status: DC
Start: 2023-10-19 — End: 2024-04-12

## 2023-10-19 MED ORDER — FAMOTIDINE 40 MG PO TABS
40.0000 mg | ORAL_TABLET | Freq: Two times a day (BID) | ORAL | 5 refills | Status: DC
Start: 2023-10-19 — End: 2024-04-12

## 2023-10-19 MED ORDER — ALPRAZOLAM 0.25 MG PO TABS: ORAL_TABLET | ORAL | 2 refills | Status: DC

## 2023-10-19 MED ORDER — STIOLTO RESPIMAT 2.5-2.5 MCG/ACT IN AERS: 2.0000 | INHALATION_SPRAY | Freq: Two times a day (BID) | RESPIRATORY_TRACT | 3 refills | Status: DC

## 2023-10-19 NOTE — Patient Instructions (Signed)

## 2023-10-19 NOTE — Progress Notes (Signed)
Subjective:    Patient ID: Christine [name], female    DOB: 04/14/1976, 47 y.o.   MRN: [id number]   Chief Complaint: medical management of chronic issues     HPI:  Christine [name] is a 47 y.o. who identifies as a female who was assigned female at birth.   Social history: Lives with: husband    Comes in today for follow up of the following chronic medical issues:  1. Primary hypertension No c/o chest pain, sob or headache. Does not check blood pressure at home BP Readings from Last 3 Encounters:  07/03/23 135/87  05/04/23 (!) 143/78  10/27/22 126/78     2. Gastroesophageal reflux disease, unspecified whether esophagitis present Is on protonix daily  3. Generalized anxiety disorder Takes combination of xanax and viibryd. Seems to be dong well    07/03/2023    9:15 AM 05/04/2023    3:07 PM 10/23/2022    2:56 PM 09/15/2022    9:32 AM  GAD 7 : Generalized Anxiety Score  Nervous, Anxious, on Edge 2 3 1 1   Control/stop worrying 2 1 1 1   Worry too much - different things 2 2 0 1  Trouble relaxing 1 2 0 1  Restless 0 1 0 1  Easily annoyed or irritable 2 2 0 2  Afraid - awful might happen 1 0 0 1  Total GAD 7 Score 10 11 2 8   Anxiety Difficulty Somewhat difficult Somewhat difficult Somewhat difficult Somewhat difficult      4. Primary insomnia Is on ambien  to sleep at night. Is not able to sleep without meds  5. Allergy to alpha-gal Still continues to avoid red meat  6. B12 deficiency Lab Results  Component Value Date   VITAMINB12 213 (L) 05/04/2023     7. BMI 32.0-32.9,adult No recent weight changes Wt Readings from Last 3 Encounters:  10/19/23 157 lb (71.2 kg)  07/03/23 152 lb 6 oz (69.1 kg)  05/04/23 152 lb (68.9 kg)   BMI Readings from Last 3 Encounters:  10/19/23 26.95 kg/m  07/03/23 26.16 kg/m  05/04/23 26.09 kg/m     New complaints: None today  Allergies  Allergen Reactions   Sulfa Antibiotics Hives   Sulfasalazine Hives   Alpha-Gal     Doxycycline Hyclate    Ivp Dye [Iodinated Contrast Media] Hives   Other     ALPHA GAL - MEAT ALLERGY    Paxil [Paroxetine Hcl] Other (See Comments)    Made pt very angry    Phosphorus Diarrhea   Outpatient Encounter Medications as of 10/19/2023  Medication Sig   albuterol (PROVENTIL) (2.5 MG/3ML) 0.083% nebulizer solution Take 3 mLs (2.5 mg total) by nebulization every 4 (four) hours as needed for wheezing or shortness of breath.   ALPRAZolam (XANAX) 0.25 MG tablet 1 po daily prn   Cholecalciferol (VITAMIN D3) 1000 units CAPS Take by mouth.   Digestive Enzymes (DIGESTIVE ENZYME PO) Take 3 each by mouth daily.   EPINEPHrine (EPIPEN 2-PAK) 0.3 mg/0.3 mL IJ SOAJ injection Inject 0.3 mg into the muscle as needed for anaphylaxis.   ethynodiol-ethinyl estradiol (KELNOR 1/35) 1-35 MG-MCG tablet TAKE 1 TABLET BY MOUTH EVERY DAY   famotidine (PEPCID) 40 MG tablet Take 1 tablet (40 mg total) by mouth in the morning and at bedtime. (Patient taking differently: Take 40 mg by mouth 2 (two) times daily between meals as needed.)   Fluticasone Propionate (XHANCE) 93 MCG/ACT EXHU Place 2 sprays into the nose 2 (  two) times daily.   hydrochlorothiazide (MICROZIDE) 12.5 MG capsule Take 1 capsule (12.5 mg total) by mouth daily. (Patient not taking: Reported on 07/03/2023)   montelukast (SINGULAIR) 10 MG tablet TAKE 1 TABLET BY MOUTH EVERYDAY AT BEDTIME   pantoprazole (PROTONIX) 40 MG tablet Take 1 tablet (40 mg total) by mouth daily.   polyethylene glycol powder (GLYCOLAX/MIRALAX) 17 GM/SCOOP powder Take 17 g by mouth daily.   Tiotropium Bromide-Olodaterol (STIOLTO RESPIMAT) 2.5-2.5 MCG/ACT AERS INHALE 2 PUFFS INTO THE LUNGS IN THE MORNING AND AT BEDTIME.   VENTOLIN HFA 108 (90 Base) MCG/ACT inhaler TAKE 2 PUFFS BY MOUTH EVERY 6 HOURS AS NEEDED FOR WHEEZE OR SHORTNESS OF BREATH   Vilazodone HCl (VIIBRYD) 20 MG TABS Take 1 tablet (20 mg total) by mouth daily.   zolpidem (AMBIEN) 10 MG tablet Take 1 tablet (10  mg total) by mouth at bedtime.   No facility-administered encounter medications on file as of 10/19/2023.    Past Surgical History:  Procedure Laterality Date   BREAST BIOPSY Right 07/2015   fibrocystic changes   CESAREAN SECTION     TONSILLECTOMY AND ADENOIDECTOMY      Family History  Problem Relation Age of Onset   Diabetes Mother    Hypertension Mother    Hyperlipidemia Father    Hypertension Father    Allergic rhinitis Neg Hx    Angioedema Neg Hx    Asthma Neg Hx    Eczema Neg Hx    Immunodeficiency Neg Hx    Urticaria Neg Hx       Controlled substance contract: 05/13/23     Review of Systems  Constitutional:  Negative for diaphoresis.  Eyes:  Negative for pain.  Respiratory:  Negative for shortness of breath.   Cardiovascular:  Negative for chest pain, palpitations and leg swelling.  Gastrointestinal:  Negative for abdominal pain.  Endocrine: Negative for polydipsia.  Skin:  Negative for rash.  Neurological:  Negative for dizziness, weakness and headaches.  Hematological:  Does not bruise/bleed easily.  All other systems reviewed and are negative.      Objective:   Physical Exam Vitals and nursing note reviewed.  Constitutional:      General: She is not in acute distress.    Appearance: Normal appearance. She is well-developed.  HENT:     Head: Normocephalic.     Right Ear: Tympanic membrane normal.     Left Ear: Tympanic membrane normal.     Nose: Nose normal.     Mouth/Throat:     Mouth: Mucous membranes are moist.  Eyes:     Pupils: Pupils are equal, round, and reactive to light.  Neck:     Vascular: No carotid bruit or JVD.  Cardiovascular:     Rate and Rhythm: Normal rate and regular rhythm.     Heart sounds: Normal heart sounds.  Pulmonary:     Effort: Pulmonary effort is normal. No respiratory distress.     Breath sounds: Normal breath sounds. No wheezing or rales.  Chest:     Chest wall: No tenderness.  Abdominal:     General:  Bowel sounds are normal. There is no distension or abdominal bruit.     Palpations: Abdomen is soft. There is no hepatomegaly, splenomegaly, mass or pulsatile mass.     Tenderness: There is no abdominal tenderness.  Musculoskeletal:        General: Normal range of motion.     Cervical back: Normal range of motion and neck supple.  Lymphadenopathy:     Cervical: No cervical adenopathy.  Skin:    General: Skin is warm and dry.  Neurological:     Mental Status: She is alert and oriented to person, place, and time.     Deep Tendon Reflexes: Reflexes are normal and symmetric.  Psychiatric:        Behavior: Behavior normal.        Thought Content: Thought content normal.        Judgment: Judgment normal.     BP (!) 146/84   Pulse 69   Temp 98.2 F (36.8 C) (Temporal)   Resp 20   Ht 5\' 4"  (1.626 m)   Wt 157 lb (71.2 kg)   SpO2 99%   BMI 26.95 kg/m        Assessment & Plan:   CANDANCE HUCKE comes in today with chief complaint of Medical Management of Chronic Issues   Diagnosis and orders addressed:  1. Primary hypertension Low sodium diet - CBC with Differential/Platelet - CMP14+EGFR - Lipid panel - hydrochlorothiazide (MICROZIDE) 12.5 MG capsule; Take 1 capsule (12.5 mg total) by mouth daily.  Dispense: 90 capsule; Refill: 1  2. Gastroesophageal reflux disease, unspecified whether esophagitis present Avoid spicy foods Do not eat 2 hours prior to bedtime  - famotidine (PEPCID) 40 MG tablet; Take 1 tablet (40 mg total) by mouth in the morning and at bedtime.  Dispense: 60 tablet; Refill: 5 - pantoprazole (PROTONIX) 40 MG tablet; Take 1 tablet (40 mg total) by mouth daily.  Dispense: 90 tablet; Refill: 1  3. Generalized anxiety disorder Stress management - ALPRAZolam (XANAX) 0.25 MG tablet; 1 po daily prn  Dispense: 20 tablet; Refill: 2 - Vilazodone HCl (VIIBRYD) 20 MG TABS; Take 1 tablet (20 mg total) by mouth daily.  Dispense: 90 tablet; Refill: 1  4. Primary  insomnia Bedtime routine - zolpidem (AMBIEN) 10 MG tablet; Take 1 tablet (10 mg total) by mouth at bedtime.  Dispense: 30 tablet; Refill: 5  5. Allergy to alpha-gal Continue to avoid red meat  6. B12 deficiency Labs pending Continue b12 injections - Vitamin B12  7. BMI 32.0-32.9,adult Discussed diet and exercise for person with BMI >25 Will recheck weight in 3-6 months   8. Mild persistent asthma, uncomplicated - Tiotropium Bromide-Olodaterol (STIOLTO RESPIMAT) 2.5-2.5 MCG/ACT AERS; Inhale 2 puffs into the lungs in the morning and at bedtime.  Dispense: 4 g; Refill: 3   Labs pending Health Maintenance reviewed Diet and exercise encouraged  Follow up plan: 6 months   Mary-Margaret Daphine Deutscher, FNP

## 2023-10-20 LAB — CBC WITH DIFFERENTIAL/PLATELET
Basophils Absolute: 0.1 10*3/uL (ref 0.0–0.2)
Basos: 1 %
EOS (ABSOLUTE): 0.1 10*3/uL (ref 0.0–0.4)
Eos: 2 %
Hematocrit: 40.5 % (ref 34.0–46.6)
Hemoglobin: 13.3 g/dL (ref 11.1–15.9)
Immature Grans (Abs): 0 10*3/uL (ref 0.0–0.1)
Immature Granulocytes: 0 %
Lymphocytes Absolute: 3.1 10*3/uL (ref 0.7–3.1)
Lymphs: 41 %
MCH: 28.9 pg (ref 26.6–33.0)
MCHC: 32.8 g/dL (ref 31.5–35.7)
MCV: 88 fL (ref 79–97)
Monocytes Absolute: 0.4 10*3/uL (ref 0.1–0.9)
Monocytes: 5 %
Neutrophils Absolute: 3.9 10*3/uL (ref 1.4–7.0)
Neutrophils: 51 %
Platelets: 284 10*3/uL (ref 150–450)
RBC: 4.61 x10E6/uL (ref 3.77–5.28)
RDW: 14 % (ref 11.7–15.4)
WBC: 7.6 10*3/uL (ref 3.4–10.8)

## 2023-10-20 LAB — CMP14+EGFR
ALT: 19 [IU]/L (ref 0–32)
AST: 21 [IU]/L (ref 0–40)
Albumin: 4.2 g/dL (ref 3.9–4.9)
Alkaline Phosphatase: 84 [IU]/L (ref 44–121)
BUN/Creatinine Ratio: 11 (ref 9–23)
BUN: 9 mg/dL (ref 6–24)
Bilirubin Total: 0.4 mg/dL (ref 0.0–1.2)
CO2: 22 mmol/L (ref 20–29)
Calcium: 9.8 mg/dL (ref 8.7–10.2)
Chloride: 100 mmol/L (ref 96–106)
Creatinine, Ser: 0.83 mg/dL (ref 0.57–1.00)
Globulin, Total: 2.6 g/dL (ref 1.5–4.5)
Glucose: 73 mg/dL (ref 70–99)
Potassium: 4.6 mmol/L (ref 3.5–5.2)
Sodium: 137 mmol/L (ref 134–144)
Total Protein: 6.8 g/dL (ref 6.0–8.5)
eGFR: 86 mL/min/{1.73_m2} (ref >59)

## 2023-10-20 LAB — LIPID PANEL
Chol/HDL Ratio: 3.7 {ratio} (ref 0.0–4.4)
Cholesterol, Total: 251 mg/dL — ABNORMAL HIGH (ref 100–199)
HDL: 68 mg/dL (ref >39)
LDL Chol Calc (NIH): 171 mg/dL — ABNORMAL HIGH (ref 0–99)
Triglycerides: 73 mg/dL (ref 0–149)
VLDL Cholesterol Cal: 12 mg/dL (ref 5–40)

## 2023-10-20 LAB — VITAMIN B12: Vitamin B-12: 600 pg/mL (ref 232–1245)

## 2023-10-26 ENCOUNTER — Telehealth: Payer: Self-pay | Admitting: Nurse Practitioner

## 2023-10-26 ENCOUNTER — Encounter: Payer: Self-pay | Admitting: Nurse Practitioner

## 2023-10-26 ENCOUNTER — Other Ambulatory Visit: Payer: Self-pay

## 2023-10-26 ENCOUNTER — Ambulatory Visit: Payer: BC Managed Care – PPO | Admitting: Nurse Practitioner

## 2023-10-26 ENCOUNTER — Other Ambulatory Visit: Payer: Self-pay | Admitting: Nurse Practitioner

## 2023-10-26 VITALS — BP 143/82 | HR 94 | Temp 98.2°F | Resp 20 | Ht 64.0 in | Wt 157.0 lb

## 2023-10-26 DIAGNOSIS — R300 Dysuria: Secondary | ICD-10-CM

## 2023-10-26 DIAGNOSIS — N3000 Acute cystitis without hematuria: Secondary | ICD-10-CM

## 2023-10-26 DIAGNOSIS — Z87440 Personal history of urinary (tract) infections: Secondary | ICD-10-CM

## 2023-10-26 LAB — URINALYSIS, COMPLETE
Bilirubin, UA: NEGATIVE
Glucose, UA: NEGATIVE
Ketones, UA: NEGATIVE
Nitrite, UA: NEGATIVE
Specific Gravity, UA: 1.02 (ref 1.005–1.030)
Urobilinogen, Ur: 0.2 mg/dL (ref 0.2–1.0)
pH, UA: 7.5 (ref 5.0–7.5)

## 2023-10-26 LAB — MICROSCOPIC EXAMINATION
RBC, Urine: >30 /[HPF] — AB (ref 0–2)
Renal Epithel, UA: NONE SEEN /[HPF]
WBC, UA: >30 /[HPF] — AB (ref 0–5)
Yeast, UA: NONE SEEN

## 2023-10-26 MED ORDER — FLUCONAZOLE 150 MG PO TABS: 150.0000 mg | ORAL_TABLET | Freq: Once | ORAL | 0 refills | Status: DC

## 2023-10-26 MED ORDER — NITROFURANTOIN MONOHYD MACRO 100 MG PO CAPS: 100.0000 mg | ORAL_CAPSULE | Freq: Two times a day (BID) | ORAL | 0 refills | Status: DC

## 2023-10-26 MED ORDER — FLUCONAZOLE 150 MG PO TABS: ORAL_TABLET | ORAL | 0 refills | Status: DC

## 2023-10-26 NOTE — Telephone Encounter (Signed)
RX was called in wrong at the pharmacy and nurse took care of it. Patient was at the pharmacy.

## 2023-10-26 NOTE — Progress Notes (Signed)
Subjective:    Patient ID: Christine [name], female    DOB: Feb 22, 1976, 47 y.o.   MRN: [id number]   Chief Complaint: Dysuria (Tiny spot on blood in underwear/) and vaginal irritation and redness   Dysuria  This is a new problem. The current episode started in the past 7 days. The problem occurs every urination. The problem has been waxing and waning. The quality of the pain is described as burning. The pain is at a severity of 7/10. The pain is moderate. There has been no fever. She is Sexually active. There is A history of pyelonephritis. Associated symptoms include flank pain, hesitancy and urgency. Pertinent negatives include no chills or discharge. She has tried nothing for the symptoms. The treatment provided mild relief. Her past medical history is significant for recurrent UTIs.     Patient Active Problem List   Diagnosis Date Noted   B12 deficiency 05/15/2023   Allergy to alpha-gal 10/27/2022   Primary hypertension 06/13/2021   Weight gain due to medication 09/13/2020   Insomnia 04/27/2020   Gastroesophageal reflux disease 09/13/2018   TMJ pain dysfunction syndrome 09/25/2017   Allergic rhinitis 02/18/2016   White coat syndrome with high blood pressure without hypertension 09/06/2015   BMI 32.0-32.9,adult 09/06/2015   Mammographic breast lesion 11/24/2014   Mild persistent asthma, uncomplicated 02/10/2013   Generalized anxiety disorder 02/10/2013   Vitamin D deficiency        Review of Systems  Constitutional:  Negative for chills and fever.  Genitourinary:  Positive for dysuria, flank pain, hesitancy and urgency.       Objective:   Physical Exam Vitals reviewed.  Constitutional:      Appearance: Normal appearance.  Cardiovascular:     Rate and Rhythm: Normal rate and regular rhythm.     Heart sounds: Normal heart sounds.  Pulmonary:     Effort: Pulmonary effort is normal.     Breath sounds: Normal breath sounds.  Skin:    General: Skin is warm.   Neurological:     General: No focal deficit present.     Mental Status: She is alert and oriented to person, place, and time.  Psychiatric:        Mood and Affect: Mood normal.        Behavior: Behavior normal.    BP (!) 143/82   Pulse 94   Temp 98.2 F (36.8 C) (Temporal)   Resp 20   Ht 5\' 4"  (1.626 m)   Wt 157 lb (71.2 kg)   SpO2 100%   BMI 26.95 kg/m         Assessment & Plan:   Christine [name] in today with chief complaint of Dysuria (Tiny spot on blood in underwear/) and vaginal irritation and redness   1. Dysuria - Urinalysis, Complete - Urine Culture  2. Acute cystitis without hematuria Take medication as prescribe Cotton underwear Take shower not bath Cranberry juice, yogurt Force fluids AZO over the counter X2 days Culture pending RTO prn  - nitrofurantoin, macrocrystal-monohydrate, (MACROBID) 100 MG capsule; Take 1 capsule (100 mg total) by mouth 2 (two) times daily. 1 po BId  Dispense: 1 capsule; Refill: 0 - fluconazole (DIFLUCAN) 150 MG tablet; Take 1 tablet (150 mg total) by mouth once for 1 dose.  Dispense: 1 tablet; Refill: 0    The above assessment and management plan was discussed with the patient. The patient verbalized understanding of and has agreed to the management plan. Patient is aware to call  the clinic if symptoms persist or worsen. Patient is aware when to return to the clinic for a follow-up visit. Patient educated on when it is appropriate to go to the emergency department.   Mary-Margaret Daphine Deutscher, FNP

## 2023-10-26 NOTE — Patient Instructions (Signed)
Take medication as prescribe Cotton underwear Take shower not bath Cranberry juice, yogurt Force fluids AZO over the counter X2 days Culture pending RTO prn  

## 2023-10-26 NOTE — Telephone Encounter (Signed)
Copied from CRM [id number]. Topic: Clinical - Prescription Issue >> Oct 26, 2023  9:29 AM Orinda Kenner C wrote: Reason for CRM: Pt is at the [id number] - [place], [place] - [address] [place] [address] Phone:[phone]:[phone] Nitrofurantoin Monohyd Macro 100 MG (TAKE 1 CAPSULE BY MOUTH 2 DAILY DAILY) qty is incorrect and needs to update correctly. Clinical informed nursing staff and will fix issue right away.

## 2023-10-26 NOTE — Addendum Note (Signed)
Addended by: Bennie Pierini on: 10/26/2023 09:28 AM   Modules accepted: Orders

## 2023-10-27 ENCOUNTER — Telehealth: Payer: Self-pay | Admitting: Family Medicine

## 2023-10-27 NOTE — Telephone Encounter (Signed)
Copied from CRM [id number]. Topic: Clinical - Medication Question >> Oct 27, 2023 12:44 PM Clayton Bibles wrote: Reason for CRM: Christine [name] has a question about her med that was called in today. Med name Fluconazole. Please call her at [phone]

## 2023-10-27 NOTE — Telephone Encounter (Signed)
Called and spoke with patient about test results and and meds that were sent in. Patient verbalized understanding

## 2023-10-28 LAB — URINE CULTURE

## 2023-11-02 ENCOUNTER — Other Ambulatory Visit: Payer: BC Managed Care – PPO

## 2023-11-03 ENCOUNTER — Other Ambulatory Visit: Payer: BC Managed Care – PPO

## 2023-11-03 ENCOUNTER — Other Ambulatory Visit: Payer: Self-pay

## 2023-11-03 DIAGNOSIS — Z87440 Personal history of urinary (tract) infections: Secondary | ICD-10-CM

## 2023-11-03 LAB — URINALYSIS, COMPLETE
Bilirubin, UA: NEGATIVE
Glucose, UA: NEGATIVE
Ketones, UA: NEGATIVE
Leukocytes,UA: NEGATIVE
Nitrite, UA: NEGATIVE
Protein,UA: NEGATIVE
RBC, UA: NEGATIVE
Specific Gravity, UA: 1.015 (ref 1.005–1.030)
Urobilinogen, Ur: 0.2 mg/dL (ref 0.2–1.0)
pH, UA: 7 (ref 5.0–7.5)

## 2023-11-04 LAB — URINE CULTURE

## 2023-11-13 MED ORDER — ALBUTEROL SULFATE (2.5 MG/3ML) 0.083% IN NEBU: 2.5000 mg | INHALATION_SOLUTION | RESPIRATORY_TRACT | 1 refills | Status: AC | PRN

## 2023-12-05 ENCOUNTER — Other Ambulatory Visit: Payer: Self-pay | Admitting: Nurse Practitioner

## 2023-12-05 DIAGNOSIS — J4530 Mild persistent asthma, uncomplicated: Secondary | ICD-10-CM

## 2023-12-26 DIAGNOSIS — F321 Major depressive disorder, single episode, moderate: Secondary | ICD-10-CM | POA: Diagnosis not present

## 2024-01-04 ENCOUNTER — Encounter: Payer: Self-pay | Admitting: Nurse Practitioner

## 2024-01-04 ENCOUNTER — Ambulatory Visit (INDEPENDENT_AMBULATORY_CARE_PROVIDER_SITE_OTHER): Payer: BC Managed Care – PPO | Admitting: Nurse Practitioner

## 2024-01-04 VITALS — BP 151/88 | HR 71 | Temp 98.3°F | Ht 64.0 in | Wt 162.0 lb

## 2024-01-04 DIAGNOSIS — J069 Acute upper respiratory infection, unspecified: Secondary | ICD-10-CM | POA: Diagnosis not present

## 2024-01-04 MED ORDER — PREDNISONE 20 MG PO TABS: 40.0000 mg | ORAL_TABLET | Freq: Every day | ORAL | 0 refills | Status: AC

## 2024-01-04 NOTE — Progress Notes (Signed)
 Subjective:    Patient ID: Christine [name], female    DOB: 09/29/1976, 48 y.o.   MRN: [id number]   Chief Complaint: Cough and Nasal Congestion   URI  This is a new problem. The current episode started 1 to 4 weeks ago. The problem has been waxing and waning. There has been no fever. Associated symptoms include coughing and headaches. Pertinent negatives include no congestion or rhinorrhea. She has tried acetaminophen for the symptoms. The treatment provided mild relief.    Patient Active Problem List   Diagnosis Date Noted   B12 deficiency 05/15/2023   Allergy to alpha-gal 10/27/2022   Primary hypertension 06/13/2021   Weight gain due to medication 09/13/2020   Insomnia 04/27/2020   Gastroesophageal reflux disease 09/13/2018   TMJ pain dysfunction syndrome 09/25/2017   Allergic rhinitis 02/18/2016   White coat syndrome with high blood pressure without hypertension 09/06/2015   BMI 32.0-32.9,adult 09/06/2015   Mammographic breast lesion 11/24/2014   Mild persistent asthma, uncomplicated 02/10/2013   Generalized anxiety disorder 02/10/2013   Vitamin D  deficiency        Review of Systems  Constitutional:  Negative for chills and fever.  HENT:  Negative for congestion and rhinorrhea.   Respiratory:  Positive for cough.   Neurological:  Positive for headaches.       Objective:   Physical Exam Constitutional:      Appearance: Normal appearance.  Cardiovascular:     Rate and Rhythm: Normal rate and regular rhythm.     Heart sounds: Normal heart sounds.  Pulmonary:     Effort: Pulmonary effort is normal.     Breath sounds: Normal breath sounds. No wheezing or rales.     Comments: deep cough Skin:    General: Skin is warm.  Neurological:     General: No focal deficit present.     Mental Status: She is alert and oriented to person, place, and time.  Psychiatric:        Mood and Affect: Mood normal.        Behavior: Behavior normal.     BP (!) 151/88   Pulse 71    Temp 98.3 F (36.8 C) (Temporal)   Ht 5\' 4"  (1.626 m)   Wt 162 lb (73.5 kg)   SpO2 100%   BMI 27.81 kg/m        Assessment & Plan:  Christine [name] in today with chief complaint of Cough and Nasal Congestion   1. URI with cough and congestion (Primary) 1. Take meds as prescribed 2. Use a cool mist humidifier especially during the winter months and when heat has been humid. 3. Use saline nose sprays frequently 4. Saline irrigations of the nose can be very helpful if done frequently.  * 4X daily for 1 week*  * Use of a nettie pot can be helpful with this. Follow directions with this* 5. Drink plenty of fluids 6. Keep thermostat turn down low 7.For any cough or congestion- mucinex OTC 8. For fever or aces or pains- take tylenol or ibuprofen appropriate for age and weight.  * for fevers greater than 101 orally you may alternate ibuprofen and tylenol every  3 hours.    Meds ordered this encounter  Medications   predniSONE  (DELTASONE ) 20 MG tablet    Sig: Take 2 tablets (40 mg total) by mouth daily with breakfast for 5 days. 2 po daily for 5 days    Dispense:  10 tablet    Refill:  0    Supervising Provider:   Hilton Lucky [[id number]]      The above assessment and management plan was discussed with the patient. The patient verbalized understanding of and has agreed to the management plan. Patient is aware to call the clinic if symptoms persist or worsen. Patient is aware when to return to the clinic for a follow-up visit. Patient educated on when it is appropriate to go to the emergency department.   Christine Gaylyn Keas, FNP

## 2024-01-04 NOTE — Patient Instructions (Signed)

## 2024-01-19 DIAGNOSIS — Z1231 Encounter for screening mammogram for malignant neoplasm of breast: Secondary | ICD-10-CM | POA: Diagnosis not present

## 2024-01-19 DIAGNOSIS — Z124 Encounter for screening for malignant neoplasm of cervix: Secondary | ICD-10-CM | POA: Diagnosis not present

## 2024-01-19 DIAGNOSIS — N951 Menopausal and female climacteric states: Secondary | ICD-10-CM | POA: Diagnosis not present

## 2024-01-19 DIAGNOSIS — Z01411 Encounter for gynecological examination (general) (routine) with abnormal findings: Secondary | ICD-10-CM | POA: Diagnosis not present

## 2024-01-20 ENCOUNTER — Ambulatory Visit: Payer: BC Managed Care – PPO

## 2024-01-20 ENCOUNTER — Other Ambulatory Visit: Payer: Self-pay | Admitting: Obstetrics and Gynecology

## 2024-01-20 ENCOUNTER — Ambulatory Visit
Admission: RE | Admit: 2024-01-20 | Discharge: 2024-01-20 | Disposition: A | Discharge status: Home / Self Care | Payer: BC Managed Care – PPO | Source: Ambulatory Visit | Attending: Obstetrics and Gynecology | Admitting: Obstetrics and Gynecology

## 2024-01-20 DIAGNOSIS — Z1231 Encounter for screening mammogram for malignant neoplasm of breast: Secondary | ICD-10-CM

## 2024-01-22 ENCOUNTER — Other Ambulatory Visit: Payer: Self-pay | Admitting: Obstetrics and Gynecology

## 2024-01-22 DIAGNOSIS — R928 Other abnormal and inconclusive findings on diagnostic imaging of breast: Secondary | ICD-10-CM

## 2024-01-27 ENCOUNTER — Encounter: Payer: Self-pay | Admitting: Obstetrics and Gynecology

## 2024-02-01 ENCOUNTER — Ambulatory Visit
Admission: RE | Admit: 2024-02-01 | Discharge: 2024-02-01 | Disposition: A | Discharge status: Home / Self Care | Source: Ambulatory Visit | Attending: Obstetrics and Gynecology | Admitting: Obstetrics and Gynecology

## 2024-02-01 ENCOUNTER — Encounter: Payer: Self-pay | Admitting: Obstetrics and Gynecology

## 2024-02-01 DIAGNOSIS — R928 Other abnormal and inconclusive findings on diagnostic imaging of breast: Secondary | ICD-10-CM

## 2024-02-01 DIAGNOSIS — N6002 Solitary cyst of left breast: Secondary | ICD-10-CM

## 2024-02-01 LAB — HM PAP SMEAR: HPV, high-risk: NEGATIVE

## 2024-02-03 ENCOUNTER — Other Ambulatory Visit

## 2024-02-03 ENCOUNTER — Encounter

## 2024-02-04 ENCOUNTER — Other Ambulatory Visit: Payer: Self-pay | Admitting: Obstetrics and Gynecology

## 2024-02-04 DIAGNOSIS — N6320 Unspecified lump in the left breast, unspecified quadrant: Secondary | ICD-10-CM

## 2024-02-08 ENCOUNTER — Other Ambulatory Visit

## 2024-02-08 ENCOUNTER — Encounter

## 2024-02-20 DIAGNOSIS — F321 Major depressive disorder, single episode, moderate: Secondary | ICD-10-CM | POA: Diagnosis not present

## 2024-02-23 ENCOUNTER — Ambulatory Visit: Admitting: Family Medicine

## 2024-02-23 ENCOUNTER — Ambulatory Visit: Payer: Self-pay

## 2024-02-23 ENCOUNTER — Telehealth: Payer: Self-pay | Admitting: Family Medicine

## 2024-02-23 NOTE — Telephone Encounter (Signed)
 Patient also had a Mychart message asking for nurse to call. Contacted patient and she had already made an appointment with DOD for 4/2.

## 2024-02-23 NOTE — Telephone Encounter (Signed)
 Called and spoke with patient. She had already contacted the office and made an appointment for tomorrow

## 2024-02-23 NOTE — Telephone Encounter (Signed)
 Copied from CRM [id number]. Topic: Appointments - Appointment Scheduling >> Feb 23, 2024  2:12 PM Pierre Bali B wrote: Patient/patient representative is calling to schedule an appointment . Pt stated that she would like to talk with [facility] Dr.Martin nurse and see if she can come in asap.  Refer to attachments for appointment information.

## 2024-02-23 NOTE — Telephone Encounter (Signed)
 Appt made

## 2024-02-23 NOTE — Telephone Encounter (Signed)
 Copied from CRM [id number]. Topic: Clinical - Red Word Triage >> Feb 23, 2024  2:28 PM [place] B wrote: [facility] that prompted transfer to Nurse Triage: pt called in has frequent urination and pain  Chief Complaint: urination pain Symptoms: pain with urination, increased frequency Frequency: x 3 days  Pertinent Negatives: Patient denies fever or bloody urine Disposition: [] ED /[] Urgent Care (no appt availability in office) / [x] Appointment(In office/virtual)/ []  [facility]/ [] Home Care/ [] Refused Recommended Disposition /[] [facility]/ []  Follow-up with PCP Additional Notes: pt took at home UTI and results were positive: would like to schedule appt to get AB to help with s/s  Reason for Disposition  [1] Painful urination AND [2] EITHER frequency or urgency AND [3] has on-call doctor  Answer Assessment - Initial Assessment Questions 1. SEVERITY: "How bad is the pain?"  (e.g., Scale 1-10; mild, moderate, or severe)   - MILD (1-3): complains slightly about urination hurting   - MODERATE (4-7): interferes with normal activities     - SEVERE (8-10): excruciating, unwilling or unable to urinate because of the pain      Moderate to severe 2. FREQUENCY: "How many times have you had painful urination today?"      Often 3. PATTERN: "Is pain present every time you urinate or just sometimes?"      Comes and goes 4. ONSET: "When did the painful urination start?"      X 3 days 5. FEVER: "Do you have a fever?" If Yes, ask: "What is your temperature, how was it measured, and when did it start?"     no 6. PAST UTI: "Have you had a urine infection before?" If Yes, ask: "When was the last time?" and "What happened that time?"      yes 7. CAUSE: "What do you think is causing the painful urination?"  (e.g., UTI, scratch, Herpes sore)     UTI 8. OTHER SYMPTOMS: "Do you have any other symptoms?" (e.g., blood in urine, flank pain, genital sores, urgency, vaginal discharge)      Frequency, pain with urination 7/10, back pain mild 9. PREGNANCY: "Is there any chance you are pregnant?" "When was your last menstrual period?"     N/a  Protocols used: Urination Pain - Female-A-AH

## 2024-02-24 ENCOUNTER — Ambulatory Visit: Admitting: Nurse Practitioner

## 2024-02-24 ENCOUNTER — Encounter: Payer: Self-pay | Admitting: Nurse Practitioner

## 2024-02-24 VITALS — BP 137/86 | HR 86 | Temp 97.4°F | Ht 64.0 in | Wt 166.0 lb

## 2024-02-24 DIAGNOSIS — B379 Candidiasis, unspecified: Secondary | ICD-10-CM

## 2024-02-24 DIAGNOSIS — R300 Dysuria: Secondary | ICD-10-CM

## 2024-02-24 DIAGNOSIS — N898 Other specified noninflammatory disorders of vagina: Secondary | ICD-10-CM | POA: Diagnosis not present

## 2024-02-24 LAB — URINALYSIS, ROUTINE W REFLEX MICROSCOPIC
Bilirubin, UA: NEGATIVE
Glucose, UA: NEGATIVE
Nitrite, UA: NEGATIVE
Protein,UA: NEGATIVE
Specific Gravity, UA: 1.015 (ref 1.005–1.030)
Urobilinogen, Ur: 0.2 mg/dL (ref 0.2–1.0)
pH, UA: 6.5 (ref 5.0–7.5)

## 2024-02-24 LAB — MICROSCOPIC EXAMINATION: Renal Epithel, UA: NONE SEEN /HPF

## 2024-02-24 LAB — WET PREP FOR TRICH, YEAST, CLUE
Clue Cell Exam: NEGATIVE
Trichomonas Exam: NEGATIVE
Yeast Exam: POSITIVE — AB

## 2024-02-24 MED ORDER — NITROFURANTOIN MONOHYD MACRO 100 MG PO CAPS: 100.0000 mg | ORAL_CAPSULE | Freq: Two times a day (BID) | ORAL | 0 refills | Status: DC

## 2024-02-24 MED ORDER — FLUCONAZOLE 150 MG PO TABS: 150.0000 mg | ORAL_TABLET | Freq: Every day | ORAL | 0 refills | Status: DC

## 2024-02-24 NOTE — Progress Notes (Signed)
 Acute Office Visit  Subjective:     Patient ID: Christine [name], female    DOB: [birth year], 48 y.o.   MRN: [id number]  Chief Complaint  Patient presents with   Urinary Tract Infection    Unsure if its a yeast infection or UTI. Symptoms started last Wednesday. Burning, frequent urination, irritation.   HPI Christine [name] is a 48 y.o. female presents 02/24/2024 for an acute visit concerns for possible UTI. " Started last week with vaginal itchiness I am in perimenopause thought that it was a yeast infection. Later this week I develop  urinary frequency, urgency, pelvic pain and dysuria x 3 days, " did an AZO test from [facility], it  was positive for UTI". Denies flank pain, fever, chills, or abnormal vaginal discharge or bleeding.  Informed her both wet prep and UA were positive yeast, she wanted me to tell her how much yeast was in her urine, since the results did have a set value, I let her see the paper. Kammy wanted to put a note on the prescription so the pharmacy can get her med ready ASAP, so she can pick them up before she goes to work.  Tried to explain to him that once I sign the medication it is up to the pharmacy to fill them up when they reported.   Active Ambulatory Problems    Diagnosis Date Noted   Vitamin D deficiency    Mild persistent asthma, uncomplicated 02/10/2013   Generalized anxiety disorder 02/10/2013   White coat syndrome with high blood pressure without hypertension 09/06/2015   BMI 32.0-32.9,adult 09/06/2015   Allergic rhinitis 02/18/2016   TMJ pain dysfunction syndrome 09/25/2017   Mammographic breast lesion 11/24/2014   Gastroesophageal reflux disease 09/13/2018   Insomnia 04/27/2020   Weight gain due to medication 09/13/2020   Primary hypertension 06/13/2021   Allergy to alpha-gal 10/27/2022   B12 deficiency 05/15/2023   Candidiasis 02/24/2024   Vaginal itching 02/24/2024   Dysuria 02/24/2024   Resolved Ambulatory Problems    Diagnosis Date Noted    Anaphylactic shock due to adverse food reaction 09/06/2015   Acute sinusitis 02/18/2016   Loose stools 04/15/2016   Dyspnea 06/23/2017   Chronic anxiety 09/25/2017   Vulvitis 11/24/2014   Allergic urticaria 12/09/2018   Vaginal discharge 09/09/2021   Past Medical History:  Diagnosis Date   Asthma    GAD (generalized anxiety disorder)     ROS Negative unless indicated in HPI    Objective:    BP 137/86   Pulse 86   Temp (!) 97.4 F (36.3 C)   Ht 5\' 4"  (1.626 m)   Wt 166 lb (75.3 kg)   SpO2 98%   BMI 28.49 kg/m  BP Readings from Last 3 Encounters:  02/24/24 137/86  01/04/24 (!) 151/88  10/26/23 (!) 143/82   Wt Readings from Last 3 Encounters:  02/24/24 166 lb (75.3 kg)  01/04/24 162 lb (73.5 kg)  10/26/23 157 lb (71.2 kg)    Physical Exam Appears well, in no apparent distress.  Vital signs are normal. The abdomen is soft without tenderness, guarding, mass, rebound or organomegaly. No CVA tenderness or inguinal adenopathy noted.  Urine dipstick shows positive for RBC's, positive for leukocytes, and positive for ketones.   Micro exam: 0-5 WBC's per HPF, 0-2 RBC's per HPF, moderate + bacteria, epithelial cells 0-10 bacteria moderate and yeast present.  Microscopic wet-mount exam shows white blood cells 0-2, bacteria many, epithelial cells few and positive yeast.  No results found for any visits on 02/24/24.      Assessment & Plan:  Dysuria -     Urinalysis, Routine w reflex microscopic -     Nitrofurantoin Monohyd Macro; Take 1 capsule (100 mg total) by mouth 2 (two) times daily.  Dispense: 14 capsule; Refill: 0 -     Urine Culture  Vaginal itching -     WET PREP FOR TRICH, YEAST, CLUE -     Fluconazole; Take 1 tablet (150 mg total) by mouth daily.  Dispense: 2 tablet; Refill: 0  Candidiasis -     Fluconazole; Take 1 tablet (150 mg total) by mouth daily.  Dispense: 2 tablet; Refill: 0  Christine [name] is a 48 year old Caucasian female seen today for UTI candidiasis,  no acute distress  UTI uncomplicated without evidence of pyelonephritis Plan to treat with broad-spectrum antibiotic while waiting for culture result, client understand based on culture result and no antibiotic may be needed Microbid 100 mg 1 tablet twice daily #14 dispense Candidiasis: Diflucan 150 mg #2 dispense: Instructed to take 1 tablet still having symptoms to repeat in 1 week Increase hydration, may use Pyridium OTC prn. Call or return to clinic prn if these symptoms worsen or fail to improve as anticipated. Continue healthy lifestyle choices, including diet (rich in fruits, vegetables, and lean    The above assessment and management plan was discussed with the patient. The patient verbalized understanding of and has agreed to the management plan. Patient is aware to call the clinic if they develop any new symptoms or if symptoms persist or worsen. Patient is aware when to return to the clinic for a follow-up visit. Patient educated on when it is appropriate to go to the emergency department.   Return if symptoms worsen or fail to improve.  Arrie Aran Santa [name], [facility] [address] [place], [place] [address] [phone]  Note: This document was prepared by Reubin Milan voice dictation technology and any errors that results from this process are unintentional.

## 2024-02-27 LAB — URINE CULTURE

## 2024-03-08 ENCOUNTER — Other Ambulatory Visit: Payer: Self-pay | Admitting: Nurse Practitioner

## 2024-03-08 DIAGNOSIS — B379 Candidiasis, unspecified: Secondary | ICD-10-CM

## 2024-03-08 DIAGNOSIS — J4530 Mild persistent asthma, uncomplicated: Secondary | ICD-10-CM

## 2024-03-08 DIAGNOSIS — N898 Other specified noninflammatory disorders of vagina: Secondary | ICD-10-CM

## 2024-03-09 ENCOUNTER — Ambulatory Visit: Admitting: Family Medicine

## 2024-03-09 VITALS — BP 145/81 | HR 75 | Temp 97.7°F | Ht 64.0 in | Wt 164.8 lb

## 2024-03-09 DIAGNOSIS — N898 Other specified noninflammatory disorders of vagina: Secondary | ICD-10-CM

## 2024-03-09 DIAGNOSIS — B3731 Acute candidiasis of vulva and vagina: Secondary | ICD-10-CM | POA: Diagnosis not present

## 2024-03-09 LAB — URINALYSIS, ROUTINE W REFLEX MICROSCOPIC
Bilirubin, UA: NEGATIVE
Glucose, UA: NEGATIVE
Ketones, UA: NEGATIVE
Leukocytes,UA: NEGATIVE
Nitrite, UA: NEGATIVE
Protein,UA: NEGATIVE
RBC, UA: NEGATIVE
Specific Gravity, UA: 1.015 (ref 1.005–1.030)
Urobilinogen, Ur: 0.2 mg/dL (ref 0.2–1.0)
pH, UA: 6 (ref 5.0–7.5)

## 2024-03-09 LAB — WET PREP FOR TRICH, YEAST, CLUE
Clue Cell Exam: NEGATIVE
Trichomonas Exam: NEGATIVE
Yeast Exam: POSITIVE — AB

## 2024-03-09 MED ORDER — FLUCONAZOLE 150 MG PO TABS: ORAL_TABLET | ORAL | 0 refills | Status: DC

## 2024-03-09 NOTE — Patient Instructions (Addendum)
 Luvena  RepHresh

## 2024-03-09 NOTE — Progress Notes (Signed)
 Subjective:  Patient ID: Christine [name], female    DOB: October 30, 1976, 48 y.o.   MRN: [id number]  Patient Care Team: Bennie Pierini, FNP as PCP - General (Nurse Practitioner)   Chief Complaint:  vaginal irritation (X 3 days )   HPI: Christine [name] is a 48 y.o. female presenting on 03/09/2024 for vaginal irritation (X 3 days )   Discussed the use of AI scribe software for clinical note transcription with the patient, who gave verbal consent to proceed.  History of Present Illness   Christine [name] is a 49 year old female in perimenopause who presents with vaginal dryness and discomfort following a recent UTI and yeast infection.  Two weeks ago, she experienced a urinary tract infection (UTI) and a yeast infection, confirmed by lab tests. She completed a course of antibiotics for the UTI and took Diflucan for the yeast infection, with one dose at the start and one at the end of the antibiotic treatment. Despite this, she continues to experience symptoms.  She reports vaginal dryness and discomfort, describing a burning sensation internally, particularly where a wet prep was performed. The wet prep showed a discharge resembling the end of a menstrual period, described as 'really light brown.' The dryness 'comes and goes,' and she has not used any treatments for vaginal dryness recently.  She is in perimenopause and mentions that her symptoms often worsen during the week she takes sugar pills from her birth control regimen. She has not had intercourse since the onset of these symptoms and denies any pain with intercourse, but describes the area as 'very uncomfortable' and 'irritated.'  She has a history of irritable bowel syndrome (IBS) and notes that certain foods, like cantaloupe, can exacerbate her symptoms. She also mentions a low fluid intake, typically drinking half a can of soda in the morning and minimal fluids throughout the day, which may contribute to her symptoms.  No fever  or chills. She is concerned about the possibility of bacterial vaginosis or a recurrent yeast infection, given her recent antibiotic use and ongoing symptoms.          Relevant past medical, surgical, family, and social history reviewed and updated as indicated.  Allergies and medications reviewed and updated. Data reviewed: Chart in Epic.   Past Medical History:  Diagnosis Date   Asthma    GAD (generalized anxiety disorder)    Vitamin D deficiency     Past Surgical History:  Procedure Laterality Date   BREAST BIOPSY Right 07/2015   fibrocystic changes   CESAREAN SECTION     TONSILLECTOMY AND ADENOIDECTOMY      Social History   Socioeconomic History   Marital status: Married    Spouse name: Not on file   Number of children: Not on file   Years of education: Not on file   Highest education level: Associate degree: academic program  Occupational History   Not on file  Tobacco Use   Smoking status: Never   Smokeless tobacco: Never  Vaping Use   Vaping status: Never Used  Substance and Sexual Activity   Alcohol use: Yes   Drug use: No   Sexual activity: Not on file  Other Topics Concern   Not on file  Social History Narrative   Not on file   Social Drivers of Health   Financial Resource Strain: Low Risk  (03/09/2024)   Overall Financial Resource Strain (CARDIA)    Difficulty of Paying Living Expenses: Not very  hard  Food Insecurity: No Food Insecurity (03/09/2024)   Hunger Vital Sign    Worried About Running Out of Food in the Last Year: Never true    Ran Out of Food in the Last Year: Never true  Transportation Needs: No Transportation Needs (03/09/2024)   PRAPARE - Administrator, Civil Service (Medical): No    Lack of Transportation (Non-Medical): No  Physical Activity: Insufficiently Active (03/09/2024)   Exercise Vital Sign    Days of Exercise per Week: 1 day    Minutes of Exercise per Session: 20 min  Stress: Stress Concern Present (03/09/2024)    [employer] of Occupational Health - Occupational Stress Questionnaire    Feeling of Stress : Rather much  Social Connections: Moderately Isolated (03/09/2024)   Social Connection and Isolation Panel [NHANES]    Frequency of Communication with Friends and Family: Three times a week    Frequency of Social Gatherings with Friends and Family: Once a week    Attends Religious Services: Never    Database administrator or Organizations: No    Attends Engineer, structural: Not on file    Marital Status: Married  Intimate Partner Violence: Not on file    Outpatient Encounter Medications as of 03/09/2024  Medication Sig   albuterol (PROVENTIL) (2.5 MG/3ML) 0.083% nebulizer solution Take 3 mLs (2.5 mg total) by nebulization every 4 (four) hours as needed for wheezing or shortness of breath.   ALPRAZolam (XANAX) 0.25 MG tablet 1 po daily prn   Cholecalciferol (VITAMIN D3) 1000 units CAPS Take by mouth.   Digestive Enzymes (DIGESTIVE ENZYME PO) Take 3 each by mouth daily.   EPINEPHrine (EPIPEN 2-PAK) 0.3 mg/0.3 mL IJ SOAJ injection Inject 0.3 mg into the muscle as needed for anaphylaxis.   famotidine (PEPCID) 40 MG tablet Take 1 tablet (40 mg total) by mouth in the morning and at bedtime.   fluconazole (DIFLUCAN) 150 MG tablet 1 po q week x 4 weeks   [facility] 1/35 1-35 MG-MCG tablet TAKE 1 TABLET BY MOUTH EVERY DAY   montelukast (SINGULAIR) 10 MG tablet TAKE 1 TABLET BY MOUTH EVERYDAY AT BEDTIME   pantoprazole (PROTONIX) 40 MG tablet Take 1 tablet (40 mg total) by mouth daily.   polyethylene glycol powder (GLYCOLAX/MIRALAX) 17 GM/SCOOP powder Take 17 g by mouth daily.   Tiotropium Bromide-Olodaterol (STIOLTO RESPIMAT) 2.5-2.5 MCG/ACT AERS INHALE 2 PUFFS INTO THE LUNGS IN THE MORNING AND AT BEDTIME.   VENTOLIN HFA 108 (90 Base) MCG/ACT inhaler TAKE 2 PUFFS BY MOUTH EVERY 6 HOURS AS NEEDED FOR WHEEZE OR SHORTNESS OF BREATH   Vilazodone HCl (VIIBRYD) 20 MG TABS Take 1 tablet (20 mg total)  by mouth daily.   zolpidem (AMBIEN) 10 MG tablet Take 1 tablet (10 mg total) by mouth at bedtime.   [DISCONTINUED] fluconazole (DIFLUCAN) 150 MG tablet Take 1 tablet (150 mg total) by mouth daily.   Fluticasone Propionate (XHANCE) 93 MCG/ACT EXHU Place 2 sprays into the nose 2 (two) times daily. (Patient not taking: Reported on 03/09/2024)   hydrochlorothiazide (MICROZIDE) 12.5 MG capsule Take 1 capsule (12.5 mg total) by mouth daily. (Patient not taking: Reported on 03/09/2024)   nitrofurantoin, macrocrystal-monohydrate, (MACROBID) 100 MG capsule Take 1 capsule (100 mg total) by mouth 2 (two) times daily. (Patient not taking: Reported on 03/09/2024)   No facility-administered encounter medications on file as of 03/09/2024.    Allergies  Allergen Reactions   Sulfa Antibiotics Hives   Sulfasalazine Hives  Alpha-Gal    Doxycycline Hyclate    Ivp Dye [Iodinated Contrast Media] Hives   Other     ALPHA GAL - MEAT ALLERGY    Paxil [Paroxetine Hcl] Other (See Comments)    Made pt very angry    Phosphorus Diarrhea    Pertinent ROS per HPI, otherwise unremarkable      Objective:  BP (!) 145/81   Pulse 75   Temp 97.7 F (36.5 C)   Ht 5\' 4"  (1.626 m)   Wt 164 lb 12.8 oz (74.8 kg)   LMP 03/02/2024   SpO2 97%   BMI 28.29 kg/m    Wt Readings from Last 3 Encounters:  03/09/24 164 lb 12.8 oz (74.8 kg)  02/24/24 166 lb (75.3 kg)  01/04/24 162 lb (73.5 kg)    Physical Exam Vitals and nursing note reviewed.  Constitutional:      General: She is not in acute distress.    Appearance: Normal appearance. She is not ill-appearing, toxic-appearing or diaphoretic.  HENT:     Head: Normocephalic and atraumatic.     Nose: Nose normal.     Mouth/Throat:     Mouth: Mucous membranes are moist.  Eyes:     Conjunctiva/sclera: Conjunctivae normal.     Pupils: Pupils are equal, round, and reactive to light.  Cardiovascular:     Rate and Rhythm: Normal rate and regular rhythm.  Pulmonary:      Effort: Pulmonary effort is normal.     Breath sounds: Normal breath sounds.  Abdominal:     General: Bowel sounds are normal.     Palpations: Abdomen is soft.  Genitourinary:    Comments: Deferred, did self wet prep Musculoskeletal:     Cervical back: Neck supple.  Skin:    General: Skin is warm and dry.     Capillary Refill: Capillary refill takes less than 2 seconds.  Neurological:     General: No focal deficit present.     Mental Status: She is alert and oriented to person, place, and time.  Psychiatric:        Mood and Affect: Mood normal.        Behavior: Behavior normal.        Thought Content: Thought content normal.        Judgment: Judgment normal.      Results for orders placed or performed in visit on 02/24/24  WET PREP FOR TRICH, YEAST, CLUE   Collection Time: 02/24/24  8:21 AM   Specimen: Vaginal Fluid   Vaginal Flui  Result Value Ref Range   Trichomonas Exam Negative Negative   Yeast Exam Positive (A) Negative   Clue Cell Exam Negative Negative  Microscopic Examination   Collection Time: 02/24/24  8:21 AM   Urine  Result Value Ref Range   WBC, UA 0-5 0 - 5 /hpf   RBC, Urine 0-2 0 - 2 /hpf   Epithelial Cells (non renal) 0-10 0 - 10 /hpf   Renal Epithel, UA None seen None seen /hpf   Bacteria, UA Moderate (A) None seen/Few   Yeast, UA Present (A) None seen  Urinalysis, Routine w reflex microscopic   Collection Time: 02/24/24  8:21 AM  Result Value Ref Range   Specific Gravity, UA 1.015 1.005 - 1.030   pH, UA 6.5 5.0 - 7.5   Color, UA Yellow Yellow   Appearance Ur Clear Clear   Leukocytes,UA Trace (A) Negative   Protein,UA Negative Negative/Trace   Glucose, UA Negative Negative  Ketones, UA Trace (A) Negative   RBC, UA Trace (A) Negative   Bilirubin, UA Negative Negative   Urobilinogen, Ur 0.2 0.2 - 1.0 mg/dL   Nitrite, UA Negative Negative   Microscopic Examination See below:   Urine Culture   Collection Time: 02/24/24  3:14 PM   Specimen:  Urine   BN  Result Value Ref Range   Urine Culture, Routine Final report (A)    Organism ID, Bacteria Enterococcus faecalis (A)    ORGANISM ID, BACTERIA Comment    Antimicrobial Susceptibility Comment        Pertinent labs & imaging results that were available during my care of the patient were reviewed by me and considered in my medical decision making.  Assessment & Plan:  Derra was seen today for vaginal irritation.  Diagnoses and all orders for this visit:  Vaginal irritation -     WET PREP FOR TRICH, YEAST, CLUE -     Urinalysis, Routine w reflex microscopic -     Urine Culture  Yeast vaginitis -     fluconazole (DIFLUCAN) 150 MG tablet; 1 po q week x 4 weeks     Assessment and Plan    Vulvovaginal Candidiasis Persistent vulvovaginal candidiasis with burning and discomfort. Previous treatment with two doses of Diflucan was insufficient. Wet prep showed significant yeast presence, contributing to prolonged symptoms and brown discharge. Yeast discharge can adhere to vaginal walls, prolonging symptoms. - Prescribe Diflucan once weekly for four weeks. - Advise abstaining from intercourse for at least seven days after starting treatment. - Inform that if symptoms recur, partner may need evaluation and treatment.  Perimenopause Perimenopausal symptoms exacerbated during placebo week of birth control pills, including vaginal dryness and mood swings. She reports feeling dismissed by previous OB consultation. Discussed diet and exercise in symptom management and advised limiting alcohol, caffeine, and spicy foods. - Provide information sheet on perimenopause management. - Recommend over-the-counter lubricants Lofena and Refresh for vaginal dryness.  General Health Maintenance Concern about inadequate fluid intake potentially contributing to symptoms. Discussed hydration importance and strategies to increase water intake, advising half to three-quarters of body weight in ounces  daily. - Encourage increasing water intake to half or three-quarters of body weight in ounces daily. - Suggest flavoring water with natural fruits like lemon, cucumber, or mint to improve palatability.          Continue all other maintenance medications.  Follow up plan: Return if symptoms worsen or fail to improve.   Continue healthy lifestyle choices, including diet (rich in fruits, vegetables, and lean proteins, and low in salt and simple carbohydrates) and exercise (at least 30 minutes of moderate physical activity daily).  Educational handout given for yeast infection, perimenopause   The above assessment and management plan was discussed with the patient. The patient verbalized understanding of and has agreed to the management plan. Patient is aware to call the clinic if they develop any new symptoms or if symptoms persist or worsen. Patient is aware when to return to the clinic for a follow-up visit. Patient educated on when it is appropriate to go to the emergency department.   Kattie Parrot, FNP-C [facility] [phone]

## 2024-03-10 ENCOUNTER — Encounter: Payer: Self-pay | Admitting: Family Medicine

## 2024-03-11 ENCOUNTER — Encounter: Payer: Self-pay | Admitting: Family Medicine

## 2024-03-11 LAB — URINE CULTURE

## 2024-03-26 DIAGNOSIS — F321 Major depressive disorder, single episode, moderate: Secondary | ICD-10-CM | POA: Diagnosis not present

## 2024-03-27 ENCOUNTER — Other Ambulatory Visit: Payer: Self-pay | Admitting: Nurse Practitioner

## 2024-04-04 ENCOUNTER — Encounter: Payer: Self-pay | Admitting: Nurse Practitioner

## 2024-04-04 ENCOUNTER — Other Ambulatory Visit: Payer: Self-pay | Admitting: Nurse Practitioner

## 2024-04-04 ENCOUNTER — Ambulatory Visit: Admitting: Nurse Practitioner

## 2024-04-04 ENCOUNTER — Ambulatory Visit: Payer: Self-pay

## 2024-04-04 ENCOUNTER — Encounter: Payer: Self-pay | Admitting: Family Medicine

## 2024-04-04 VITALS — BP 195/108 | HR 80 | Temp 97.1°F | Ht 64.0 in | Wt 165.0 lb

## 2024-04-04 DIAGNOSIS — B3731 Acute candidiasis of vulva and vagina: Secondary | ICD-10-CM

## 2024-04-04 DIAGNOSIS — N9489 Other specified conditions associated with female genital organs and menstrual cycle: Secondary | ICD-10-CM

## 2024-04-04 DIAGNOSIS — F411 Generalized anxiety disorder: Secondary | ICD-10-CM

## 2024-04-04 LAB — WET PREP FOR TRICH, YEAST, CLUE
Clue Cell Exam: NEGATIVE
Trichomonas Exam: NEGATIVE
Yeast Exam: POSITIVE — AB

## 2024-04-04 MED ORDER — MONISTAT 1 COMBO PACK 1200 & 2 MG & % VA KIT: 1.0000 | PACK | Freq: Once | VAGINAL | 0 refills | Status: AC

## 2024-04-04 MED ORDER — FLUCONAZOLE 150 MG PO TABS
ORAL_TABLET | ORAL | 0 refills | Status: DC
Start: 2024-04-04 — End: 2024-07-04

## 2024-04-04 NOTE — Telephone Encounter (Signed)
 Copied from CRM [id number]. Topic: Clinical - Pink Word Triage >> Apr 04, 2024  9:48 AM Tiffany H wrote: Reason for Triage: Patient called to advise that she's continuing to have symptoms of a yeast infection that started 4 weeks ago. She was prescribed Diflucan  02/23/24. She would like to have that medication extended another couple of weeks.   Please call patient back.  Chief Complaint: vaginal itching, burning and painful Symptoms: see above Frequency: 4 weeks ago Pertinent Negatives: Patient denies fever, vaginal discharge Disposition: [] ED /[] Urgent Care (no appt availability in office) / [x] Appointment(In office/virtual)/ []  [facility]/ [] Home Care/ [] Refused Recommended Disposition /[] [facility]/ []  Follow-up with PCP Additional Notes: apt made for this afternoon; care advice given, denies questions; instructed to go to ER if becomes worse.   Reason for Disposition  [1] MILD-MODERATE pain AND [2] present > 24 hours  (Exception: Chronic pain.)  Answer Assessment - Initial Assessment Questions 1. SYMPTOM: "What's the main symptom you're concerned about?" (e.g., pain, itching, dryness)     Itching, pain, dryness 2. LOCATION: "Where is the  itching located?" (e.g., inside/outside, left/right)     vaginal 3. ONSET: "When did the  itching  start?"     A month ago 4. PAIN: "Is there any pain?" If Yes, ask: "How bad is it?" (Scale: 1-10; mild, moderate, severe)   -  MILD (1-3): Doesn't interfere with normal activities.    -  MODERATE (4-7): Interferes with normal activities (e.g., work or school) or awakens from sleep.     -  SEVERE (8-10): Excruciating pain, unable to do any normal activities.     moderate 5. ITCHING: "Is there any itching?" If Yes, ask: "How bad is it?" (Scale: 1-10; mild, moderate, severe)     moderate 6. CAUSE: "What do you think is causing the discharge?" "Have you had the same problem before? What happened then?"     Yeast infection 7. OTHER  SYMPTOMS: "Do you have any other symptoms?" (e.g., fever, itching, vaginal bleeding, pain with urination, injury to genital area, vaginal foreign body)     no 8. PREGNANCY: "Is there any chance you are pregnant?" "When was your last menstrual period?"     no  Protocols used: Vaginal Symptoms-A-AH

## 2024-04-04 NOTE — Telephone Encounter (Unsigned)
 Copied from CRM [id number]. Topic: Clinical - Pink Word Triage >> Apr 04, 2024  9:48 AM Tiffany H wrote: Reason for Triage: Patient called to advise that she's continuing to have symptoms of a yeast infection that started 4 weeks ago. She was prescribed Diflucan  02/23/24. She would like to have that medication extended another couple of weeks.   Please call patient back.

## 2024-04-04 NOTE — Progress Notes (Signed)
 Subjective:    Patient ID: Christine [name], female    DOB: 1976/06/18, 48 y.o.   MRN: [id number]   Chief Complaint: vaginal itching  HPI  Patient has had vaginal itching for greater then a month. She has been on diflucan  weekly for 4 weeks. She comes in today still c/o vaginal itching. No real discharge. Patient Active Problem List   Diagnosis Date Noted   Candidiasis 02/24/2024   Vaginal itching 02/24/2024   Dysuria 02/24/2024   B12 deficiency 05/15/2023   Allergy to alpha-gal 10/27/2022   Primary hypertension 06/13/2021   Weight gain due to medication 09/13/2020   Insomnia 04/27/2020   Gastroesophageal reflux disease 09/13/2018   TMJ pain dysfunction syndrome 09/25/2017   Allergic rhinitis 02/18/2016   White coat syndrome with high blood pressure without hypertension 09/06/2015   BMI 32.0-32.9,adult 09/06/2015   Mammographic breast lesion 11/24/2014   Mild persistent asthma, uncomplicated 02/10/2013   Generalized anxiety disorder 02/10/2013   Vitamin D  deficiency         Review of Systems  Constitutional:  Negative for diaphoresis.  Eyes:  Negative for pain.  Respiratory:  Negative for shortness of breath.   Cardiovascular:  Negative for chest pain, palpitations and leg swelling.  Gastrointestinal:  Negative for abdominal pain.  Endocrine: Negative for polydipsia.  Genitourinary:  Negative for dysuria, flank pain, urgency, vaginal discharge and vaginal pain.  Skin:  Negative for rash.  Neurological:  Negative for dizziness, weakness and headaches.  Hematological:  Does not bruise/bleed easily.  All other systems reviewed and are negative.      Objective:   Physical Exam Constitutional:      Appearance: Normal appearance.  Cardiovascular:     Rate and Rhythm: Normal rate and regular rhythm.     Heart sounds: Normal heart sounds.  Pulmonary:     Effort: Pulmonary effort is normal.     Breath sounds: Normal breath sounds.  Genitourinary:    General: Normal  vulva.     Vagina: No vaginal discharge.     Rectum: Normal.     Comments: Vaginal mucosa erythematous and moist appearing Skin:    General: Skin is warm.  Neurological:     General: No focal deficit present.     Mental Status: She is alert and oriented to person, place, and time.  Psychiatric:        Mood and Affect: Mood normal.        Behavior: Behavior normal.     BP (!) 195/108   Pulse 80   Temp (!) 97.1 F (36.2 C) (Temporal)   Ht 5\' 4"  (1.626 m)   Wt 165 lb (74.8 kg)   LMP 03/02/2024   SpO2 100%   BMI 28.32 kg/m   Wet prep- yeast      Assessment & Plan:  Christine [name] in today with chief complaint of vaginal irritation   1. Vaginal burning (Primary) - WET PREP FOR TRICH, YEAST, CLUE  2. Vaginal candidiasis Use monistat intra vaginally as prescribed No sexual intercourse for 1 week Avoid bubble baths RTO prn - miconazole (MONISTAT 1 COMBO PACK) kit; Place 1 each vaginally once for 1 dose.  Dispense: 1 each; Refill: 0 - fluconazole  (DIFLUCAN ) 150 MG tablet; 1 po q week x 4 weeks  Dispense: 4 tablet; Refill: 0    The above assessment and management plan was discussed with the patient. The patient verbalized understanding of and has agreed to the management plan. Patient is aware to  call the clinic if symptoms persist or worsen. Patient is aware when to return to the clinic for a follow-up visit. Patient educated on when it is appropriate to go to the emergency department.   Mary-Margaret Gaylyn Keas, FNP

## 2024-04-04 NOTE — Patient Instructions (Signed)

## 2024-04-05 ENCOUNTER — Ambulatory Visit: Payer: Self-pay | Admitting: Nurse Practitioner

## 2024-04-12 ENCOUNTER — Ambulatory Visit: Payer: BC Managed Care – PPO | Admitting: Nurse Practitioner

## 2024-04-12 ENCOUNTER — Encounter: Payer: Self-pay | Admitting: Nurse Practitioner

## 2024-04-12 VITALS — BP 141/87 | HR 69 | Temp 97.2°F | Ht 64.0 in | Wt 166.0 lb

## 2024-04-12 DIAGNOSIS — Z91018 Allergy to other foods: Secondary | ICD-10-CM

## 2024-04-12 DIAGNOSIS — Z6832 Body mass index (BMI) 32.0-32.9, adult: Secondary | ICD-10-CM

## 2024-04-12 DIAGNOSIS — F411 Generalized anxiety disorder: Secondary | ICD-10-CM

## 2024-04-12 DIAGNOSIS — K219 Gastro-esophageal reflux disease without esophagitis: Secondary | ICD-10-CM

## 2024-04-12 DIAGNOSIS — B379 Candidiasis, unspecified: Secondary | ICD-10-CM | POA: Diagnosis not present

## 2024-04-12 DIAGNOSIS — I10 Essential (primary) hypertension: Secondary | ICD-10-CM | POA: Diagnosis not present

## 2024-04-12 DIAGNOSIS — F5101 Primary insomnia: Secondary | ICD-10-CM

## 2024-04-12 DIAGNOSIS — E538 Deficiency of other specified B group vitamins: Secondary | ICD-10-CM

## 2024-04-12 DIAGNOSIS — E559 Vitamin D deficiency, unspecified: Secondary | ICD-10-CM

## 2024-04-12 LAB — WET PREP FOR TRICH, YEAST, CLUE
Clue Cell Exam: NEGATIVE
Trichomonas Exam: NEGATIVE
Yeast Exam: NEGATIVE

## 2024-04-12 MED ORDER — PANTOPRAZOLE SODIUM 40 MG PO TBEC: 40.0000 mg | DELAYED_RELEASE_TABLET | Freq: Every day | ORAL | 1 refills | Status: DC

## 2024-04-12 MED ORDER — VILAZODONE HCL 20 MG PO TABS: 1.0000 | ORAL_TABLET | Freq: Every day | ORAL | 1 refills | Status: DC

## 2024-04-12 MED ORDER — ALPRAZOLAM 0.25 MG PO TABS: ORAL_TABLET | ORAL | 2 refills | Status: DC

## 2024-04-12 MED ORDER — HYDROCHLOROTHIAZIDE 12.5 MG PO CAPS
12.5000 mg | ORAL_CAPSULE | Freq: Every day | ORAL | 1 refills | Status: DC
Start: 2024-04-12 — End: 2024-10-06

## 2024-04-12 MED ORDER — FAMOTIDINE 40 MG PO TABS: 40.0000 mg | ORAL_TABLET | Freq: Two times a day (BID) | ORAL | 5 refills | Status: DC

## 2024-04-12 MED ORDER — ZOLPIDEM TARTRATE 10 MG PO TABS: 10.0000 mg | ORAL_TABLET | Freq: Every day | ORAL | 5 refills | Status: DC

## 2024-04-12 NOTE — Progress Notes (Signed)
 Subjective:    Patient ID: Christine [name], female    DOB: [birth year], 48 y.o.   MRN: [id number]   Chief Complaint: medical management of chronic issues     HPI:  Christine [name] is a 48 y.o. who identifies as a female who was assigned female at birth.   Social history: Lives with: husband    Comes in today for follow up of the following chronic medical issues:  1. Primary hypertension No c/o chest pain, sob or headache. Does not check blood pressure at home BP Readings from Last 3 Encounters:  04/12/24 (!) 141/87  04/04/24 (!) 195/108  03/09/24 (!) 145/81     2. Gastroesophageal reflux disease, unspecified whether esophagitis present Is on protonix  daily  3. Generalized anxiety disorder Takes combination of xanax  and viibryd . Seems to be dong well    04/12/2024    3:30 PM 04/04/2024    3:48 PM 10/26/2023    8:52 AM 10/19/2023    3:32 PM  GAD 7 : Generalized Anxiety Score  Nervous, Anxious, on Edge 2 1 2 2   Control/stop worrying 2 1 1 2   Worry too much - different things 2 2 1 2   Trouble relaxing 1 1 1 1   Restless 2 1 1 1   Easily annoyed or irritable 2 1 1 2   Afraid - awful might happen 2 2 1 2   Total GAD 7 Score 13 9 8 12   Anxiety Difficulty Not difficult at all Not difficult at all Somewhat difficult Somewhat difficult      4. Primary insomnia Is on ambien   to sleep at night. Is not able to sleep without meds  5. Allergy to alpha-gal Still continues to avoid red meat  6. B12 deficiency Lab Results  Component Value Date   VITAMINB12 600 10/19/2023     7. Vaginal candidiasis Has had issues for over a month. She was recently given diflucan  to take weekly and monistat  intravaginally. Is some better but wants rechecked today.  8. BMI 32.0-32.9,adult No recent weight changes Wt Readings from Last 3 Encounters:  04/12/24 166 lb (75.3 kg)  04/04/24 165 lb (74.8 kg)  03/09/24 164 lb 12.8 oz (74.8 kg)   BMI Readings from Last 3 Encounters:  04/12/24  28.49 kg/m  04/04/24 28.32 kg/m  03/09/24 28.29 kg/m     New complaints: None today  Allergies  Allergen Reactions   Sulfa Antibiotics Hives   Sulfasalazine Hives   Alpha-Gal    Doxycycline  Hyclate    Ivp Dye [Iodinated Contrast Media] Hives   Other     ALPHA GAL - MEAT ALLERGY    Paxil [Paroxetine Hcl] Other (See Comments)    Made pt very angry    Phosphorus Diarrhea   Outpatient Encounter Medications as of 04/12/2024  Medication Sig   albuterol  (PROVENTIL ) (2.5 MG/3ML) 0.083% nebulizer solution Take 3 mLs (2.5 mg total) by nebulization every 4 (four) hours as needed for wheezing or shortness of breath.   ALPRAZolam  (XANAX ) 0.25 MG tablet 1 po daily prn   Cholecalciferol (VITAMIN D3) 1000 units CAPS Take by mouth.   Digestive Enzymes (DIGESTIVE ENZYME PO) Take 3 each by mouth daily.   EPINEPHrine  (EPIPEN  2-PAK) 0.3 mg/0.3 mL IJ SOAJ injection Inject 0.3 mg into the muscle as needed for anaphylaxis.   famotidine  (PEPCID ) 40 MG tablet Take 1 tablet (40 mg total) by mouth in the morning and at bedtime.   fluconazole  (DIFLUCAN ) 150 MG tablet 1 po q week x 4  weeks   hydrochlorothiazide  (MICROZIDE ) 12.5 MG capsule Take 1 capsule (12.5 mg total) by mouth daily.   KELNOR 1/35 1-35 MG-MCG tablet TAKE 1 TABLET BY MOUTH EVERY DAY   montelukast  (SINGULAIR ) 10 MG tablet TAKE 1 TABLET BY MOUTH EVERYDAY AT BEDTIME   pantoprazole  (PROTONIX ) 40 MG tablet Take 1 tablet (40 mg total) by mouth daily.   polyethylene glycol powder (GLYCOLAX /MIRALAX ) 17 GM/SCOOP powder Take 17 g by mouth daily.   Tiotropium Bromide -Olodaterol (STIOLTO RESPIMAT ) 2.5-2.5 MCG/ACT AERS INHALE 2 PUFFS INTO THE LUNGS IN THE MORNING AND AT BEDTIME.   VENTOLIN  HFA 108 (90 Base) MCG/ACT inhaler TAKE 2 PUFFS BY MOUTH EVERY 6 HOURS AS NEEDED FOR WHEEZE OR SHORTNESS OF BREATH   Vilazodone  HCl 20 MG TABS TAKE 1 TABLET BY MOUTH EVERY DAY   zolpidem  (AMBIEN ) 10 MG tablet Take 1 tablet (10 mg total) by mouth at bedtime.   No  facility-administered encounter medications on file as of 04/12/2024.    Past Surgical History:  Procedure Laterality Date   BREAST BIOPSY Right 07/2015   fibrocystic changes   CESAREAN SECTION     TONSILLECTOMY AND ADENOIDECTOMY      Family History  Problem Relation Age of Onset   Diabetes Mother    Hypertension Mother    Hyperlipidemia Father    Hypertension Father    Allergic rhinitis Neg Hx    Angioedema Neg Hx    Asthma Neg Hx    Eczema Neg Hx    Immunodeficiency Neg Hx    Urticaria Neg Hx       Controlled substance contract: 05/13/23     Review of Systems  Constitutional:  Negative for diaphoresis.  Eyes:  Negative for pain.  Respiratory:  Negative for shortness of breath.   Cardiovascular:  Negative for chest pain, palpitations and leg swelling.  Gastrointestinal:  Negative for abdominal pain.  Endocrine: Negative for polydipsia.  Skin:  Negative for rash.  Neurological:  Negative for dizziness, weakness and headaches.  Hematological:  Does not bruise/bleed easily.  All other systems reviewed and are negative.      Objective:   Physical Exam Vitals and nursing note reviewed.  Constitutional:      General: She is not in acute distress.    Appearance: Normal appearance. She is well-developed.  HENT:     Head: Normocephalic.     Right Ear: Tympanic membrane normal.     Left Ear: Tympanic membrane normal.     Nose: Nose normal.     Mouth/Throat:     Mouth: Mucous membranes are moist.  Eyes:     Pupils: Pupils are equal, round, and reactive to light.  Neck:     Vascular: No carotid bruit or JVD.  Cardiovascular:     Rate and Rhythm: Normal rate and regular rhythm.     Heart sounds: Normal heart sounds.  Pulmonary:     Effort: Pulmonary effort is normal. No respiratory distress.     Breath sounds: Normal breath sounds. No wheezing or rales.  Chest:     Chest wall: No tenderness.  Abdominal:     General: Bowel sounds are normal. There is no  distension or abdominal bruit.     Palpations: Abdomen is soft. There is no hepatomegaly, splenomegaly, mass or pulsatile mass.     Tenderness: There is no abdominal tenderness.  Musculoskeletal:        General: Normal range of motion.     Cervical back: Normal range of motion and  neck supple.  Lymphadenopathy:     Cervical: No cervical adenopathy.  Skin:    General: Skin is warm and dry.  Neurological:     Mental Status: She is alert and oriented to person, place, and time.     Deep Tendon Reflexes: Reflexes are normal and symmetric.  Psychiatric:        Behavior: Behavior normal.        Thought Content: Thought content normal.        Judgment: Judgment normal.     BP (!) 141/87   Pulse 69   Temp (!) 97.2 F (36.2 C) (Temporal)   Ht 5\' 4"  (1.626 m)   Wt 166 lb (75.3 kg)   SpO2 99%   BMI 28.49 kg/m   Wet prep negative for yeast     Assessment & Plan:   EMRYS MCEACHRON comes in today with chief complaint of Medical Management of Chronic Issues   Diagnosis and orders addressed:  1. Primary hypertension Low sodium diet - CBC with Differential/Platelet - CMP14+EGFR - Lipid panel - hydrochlorothiazide  (MICROZIDE ) 12.5 MG capsule; Take 1 capsule (12.5 mg total) by mouth daily.  Dispense: 90 capsule; Refill: 1  2. Gastroesophageal reflux disease, unspecified whether esophagitis present Avoid spicy foods Do not eat 2 hours prior to bedtime  - famotidine  (PEPCID ) 40 MG tablet; Take 1 tablet (40 mg total) by mouth in the morning and at bedtime.  Dispense: 60 tablet; Refill: 5 - pantoprazole  (PROTONIX ) 40 MG tablet; Take 1 tablet (40 mg total) by mouth daily.  Dispense: 90 tablet; Refill: 1  3. Generalized anxiety disorder Stress management - ALPRAZolam  (XANAX ) 0.25 MG tablet; 1 po daily prn  Dispense: 20 tablet; Refill: 2 - Vilazodone  HCl (VIIBRYD ) 20 MG TABS; Take 1 tablet (20 mg total) by mouth daily.  Dispense: 90 tablet; Refill: 1  4. Primary insomnia Bedtime  routine - zolpidem  (AMBIEN ) 10 MG tablet; Take 1 tablet (10 mg total) by mouth at bedtime.  Dispense: 30 tablet; Refill: 5  5. Allergy to alpha-gal Continue to avoid red meat  6. B12 deficiency Labs pending Continue b12 injections - Vitamin B12  7. BMI 32.0-32.9,adult Discussed diet and exercise for person with BMI >25 Will recheck weight in 3-6 months   8. Mild persistent asthma, uncomplicated - Tiotropium Bromide -Olodaterol (STIOLTO RESPIMAT ) 2.5-2.5 MCG/ACT AERS; Inhale 2 puffs into the lungs in the morning and at bedtime.  Dispense: 4 g; Refill: 3   Labs pending Health Maintenance reviewed Diet and exercise encouraged  Follow up plan: 6 months   Mary-Margaret Gaylyn Keas, FNP

## 2024-04-12 NOTE — Patient Instructions (Signed)
 Exercising to Stay Healthy To become healthy and stay healthy, it is recommended that you do moderate-intensity and vigorous-intensity exercise. You can tell that you are exercising at a moderate intensity if your heart starts beating faster and you start breathing faster but can still hold a conversation. You can tell that you are exercising at a vigorous intensity if you are breathing much harder and faster and cannot hold a conversation while exercising. How can exercise benefit me? Exercising regularly is important. It has many health benefits, such as: Improving overall fitness, flexibility, and endurance. Increasing bone density. Helping with weight control. Decreasing body fat. Increasing muscle strength and endurance. Reducing stress and tension, anxiety, depression, or anger. Improving overall health. What guidelines should I follow while exercising? Before you start a new exercise program, talk with your health care provider. Do not exercise so much that you hurt yourself, feel dizzy, or get very short of breath. Wear comfortable clothes and wear shoes with good support. Drink plenty of water while you exercise to prevent dehydration or heat stroke. Work out until your breathing and your heartbeat get faster (moderate intensity). How often should I exercise? Choose an activity that you enjoy, and set realistic goals. Your health care provider can help you make an activity plan that is individually designed and works best for you. Exercise regularly as told by your health care provider. This may include: Doing strength training two times a week, such as: Lifting weights. Using resistance bands. Push-ups. Sit-ups. Yoga. Doing a certain intensity of exercise for a given amount of time. Choose from these options: A total of 150 minutes of moderate-intensity exercise every week. A total of 75 minutes of vigorous-intensity exercise every week. A mix of moderate-intensity and  vigorous-intensity exercise every week. Children, pregnant women, people who have not exercised regularly, people who are overweight, and older adults may need to talk with a health care provider about what activities are safe to perform. If you have a medical condition, be sure to talk with your health care provider before you start a new exercise program. What are some exercise ideas? Moderate-intensity exercise ideas include: Walking 1 mile (1.6 km) in about 15 minutes. Biking. Hiking. Golfing. Dancing. Water aerobics. Vigorous-intensity exercise ideas include: Walking 4.5 miles (7.2 km) or more in about 1 hour. Jogging or running 5 miles (8 km) in about 1 hour. Biking 10 miles (16.1 km) or more in about 1 hour. Lap swimming. Roller-skating or in-line skating. Cross-country skiing. Vigorous competitive sports, such as football, basketball, and soccer. Jumping rope. Aerobic dancing. What are some everyday activities that can help me get exercise? Yard work, such as: Child psychotherapist. Raking and bagging leaves. Washing your car. Pushing a stroller. Shoveling snow. Gardening. Washing windows or floors. How can I be more active in my day-to-day activities? Use stairs instead of an elevator. Take a walk during your lunch break. If you drive, park your car farther away from your work or school. If you take public transportation, get off one stop early and walk the rest of the way. Stand up or walk around during all of your indoor phone calls. Get up, stretch, and walk around every 30 minutes throughout the day. Enjoy exercise with a friend. Support to continue exercising will help you keep a regular routine of activity. Where to find more information You can find more information about exercising to stay healthy from: U.[place]. Department of Health and Human Services: ThisPath.fi Centers for Disease Control and Prevention (  CDC): FootballExhibition.com.br Summary Exercising regularly is  important. It will improve your overall fitness, flexibility, and endurance. Regular exercise will also improve your overall health. It can help you control your weight, reduce stress, and improve your bone density. Do not exercise so much that you hurt yourself, feel dizzy, or get very short of breath. Before you start a new exercise program, talk with your health care provider. This information is not intended to replace advice given to you by your health care provider. Make sure you discuss any questions you have with your health care provider. Document Revised: 03/08/2021 Document Reviewed: 03/08/2021 Elsevier Patient Education  2024 [employer].

## 2024-04-16 ENCOUNTER — Other Ambulatory Visit: Payer: Self-pay | Admitting: Nurse Practitioner

## 2024-04-16 DIAGNOSIS — J4530 Mild persistent asthma, uncomplicated: Secondary | ICD-10-CM

## 2024-04-21 ENCOUNTER — Ambulatory Visit: Payer: Self-pay | Admitting: Nurse Practitioner

## 2024-05-14 DIAGNOSIS — F321 Major depressive disorder, single episode, moderate: Secondary | ICD-10-CM | POA: Diagnosis not present

## 2024-05-24 ENCOUNTER — Other Ambulatory Visit: Payer: Self-pay | Admitting: Nurse Practitioner

## 2024-05-24 DIAGNOSIS — J4530 Mild persistent asthma, uncomplicated: Secondary | ICD-10-CM

## 2024-05-26 ENCOUNTER — Other Ambulatory Visit: Payer: Self-pay | Admitting: Nurse Practitioner

## 2024-05-26 ENCOUNTER — Telehealth: Payer: Self-pay

## 2024-05-26 ENCOUNTER — Ambulatory Visit: Payer: Self-pay | Admitting: Nurse Practitioner

## 2024-05-26 DIAGNOSIS — J4530 Mild persistent asthma, uncomplicated: Secondary | ICD-10-CM

## 2024-05-26 MED ORDER — ETHYNODIOL DIAC-ETH ESTRADIOL 1-35 MG-MCG PO TABS: 1.0000 | ORAL_TABLET | Freq: Every day | ORAL | 11 refills | Status: DC

## 2024-05-26 MED ORDER — ETHYNODIOL DIAC-ETH ESTRADIOL 1-35 MG-MCG PO TABS: 1.0000 | ORAL_TABLET | Freq: Every day | ORAL | 11 refills | Status: AC

## 2024-05-26 NOTE — Telephone Encounter (Signed)
 Patient notified. Pt would like her [facility] script to state that she skips her placebo week pills as directed by OBGYN so she will be able to pick up refill on time.

## 2024-05-26 NOTE — Telephone Encounter (Signed)
 Copied from CRM [id number]. Topic: General - Other >> May 26, 2024  1:51 PM Yolanda T wrote: Reason for CRM: patient called to speak with nurse about the message provider sent her. Please have provider or nurse all her back.

## 2024-05-26 NOTE — Addendum Note (Signed)
 Addended by: Sharika Mosquera, MARY-MARGARET on: 05/26/2024 04:37 PM   Modules accepted: Orders

## 2024-07-04 ENCOUNTER — Other Ambulatory Visit: Payer: Self-pay | Admitting: Nurse Practitioner

## 2024-07-04 ENCOUNTER — Ambulatory Visit: Admitting: Nurse Practitioner

## 2024-07-04 ENCOUNTER — Encounter: Payer: Self-pay | Admitting: Nurse Practitioner

## 2024-07-04 ENCOUNTER — Ambulatory Visit: Payer: Self-pay | Admitting: Nurse Practitioner

## 2024-07-04 VITALS — BP 132/77 | HR 79 | Temp 98.3°F | Ht 64.0 in | Wt 166.0 lb

## 2024-07-04 DIAGNOSIS — B3731 Acute candidiasis of vulva and vagina: Secondary | ICD-10-CM

## 2024-07-04 DIAGNOSIS — B9689 Other specified bacterial agents as the cause of diseases classified elsewhere: Secondary | ICD-10-CM | POA: Diagnosis not present

## 2024-07-04 DIAGNOSIS — N760 Acute vaginitis: Secondary | ICD-10-CM | POA: Diagnosis not present

## 2024-07-04 DIAGNOSIS — R6889 Other general symptoms and signs: Secondary | ICD-10-CM | POA: Diagnosis not present

## 2024-07-04 LAB — MICROSCOPIC EXAMINATION
Bacteria, UA: NONE SEEN
Renal Epithel, UA: NONE SEEN /HPF
Yeast, UA: NONE SEEN

## 2024-07-04 LAB — WET PREP FOR TRICH, YEAST, CLUE
Clue Cell Exam: POSITIVE — AB
Trichomonas Exam: NEGATIVE
Yeast Exam: NEGATIVE

## 2024-07-04 LAB — URINALYSIS, ROUTINE W REFLEX MICROSCOPIC
Bilirubin, UA: NEGATIVE
Glucose, UA: NEGATIVE
Ketones, UA: NEGATIVE
Leukocytes,UA: NEGATIVE
Nitrite, UA: NEGATIVE
Protein,UA: NEGATIVE
RBC, UA: NEGATIVE
Specific Gravity, UA: 1.015 (ref 1.005–1.030)
Urobilinogen, Ur: 0.2 mg/dL (ref 0.2–1.0)
pH, UA: 7.5 (ref 5.0–7.5)

## 2024-07-04 MED ORDER — METRONIDAZOLE 500 MG PO TABS: 500.0000 mg | ORAL_TABLET | Freq: Three times a day (TID) | ORAL | 0 refills | Status: DC

## 2024-07-04 MED ORDER — METRONIDAZOLE 500 MG PO TABS
500.0000 mg | ORAL_TABLET | Freq: Two times a day (BID) | ORAL | 0 refills | Status: AC
Start: 2024-07-04 — End: 2024-07-11

## 2024-07-04 MED ORDER — FLUCONAZOLE 150 MG PO TABS: ORAL_TABLET | ORAL | 0 refills | Status: DC

## 2024-07-04 NOTE — Addendum Note (Signed)
 Addended by: MILAS KENT D on: 07/04/2024 01:48 PM   Modules accepted: Orders

## 2024-07-04 NOTE — Patient Instructions (Signed)

## 2024-07-04 NOTE — Progress Notes (Signed)
 Subjective:    Patient ID: Christine [name], female    DOB: 11-May-1976, 48 y.o.   MRN: [id number]   Chief Complaint: vaginal irritation  Vaginal Discharge The patient's primary symptoms include vaginal discharge. The patient's pertinent negatives include no genital itching. This is a recurrent problem. The current episode started more than 1 year ago. The problem has been waxing and waning. Pertinent negatives include no abdominal pain, headaches, painful intercourse or rash. The vaginal discharge was normal. She has not been passing clots.     Patient Active Problem List   Diagnosis Date Noted   Candidiasis 02/24/2024   Vaginal itching 02/24/2024   B12 deficiency 05/15/2023   Allergy to alpha-gal 10/27/2022   Primary hypertension 06/13/2021   Weight gain due to medication 09/13/2020   Insomnia 04/27/2020   Gastroesophageal reflux disease 09/13/2018   TMJ pain dysfunction syndrome 09/25/2017   Allergic rhinitis 02/18/2016   White coat syndrome with high blood pressure without hypertension 09/06/2015   BMI 32.0-32.9,adult 09/06/2015   Mammographic breast lesion 11/24/2014   Mild persistent asthma, uncomplicated 02/10/2013   GAD (generalized anxiety disorder) 02/10/2013   Vitamin D  deficiency        Review of Systems  Constitutional:  Negative for diaphoresis.  Eyes:  Negative for pain.  Respiratory:  Negative for shortness of breath.   Cardiovascular:  Negative for chest pain, palpitations and leg swelling.  Gastrointestinal:  Negative for abdominal pain.  Endocrine: Negative for polydipsia.  Genitourinary:  Positive for vaginal discharge.  Skin:  Negative for rash.  Neurological:  Negative for dizziness, weakness and headaches.  Hematological:  Does not bruise/bleed easily.  All other systems reviewed and are negative.      Objective:   Physical Exam Constitutional:      Appearance: Normal appearance. She is obese.  Cardiovascular:     Rate and Rhythm: Normal  rate and regular rhythm.     Heart sounds: Normal heart sounds.  Pulmonary:     Breath sounds: Normal breath sounds.  Skin:    General: Skin is warm.  Neurological:     General: No focal deficit present.     Mental Status: She is alert and oriented to person, place, and time.  Psychiatric:        Mood and Affect: Mood normal.        Behavior: Behavior normal.     BP 132/77   Pulse 79   Temp 98.3 F (36.8 C)   Ht 5' 4 (1.626 m)   Wt 166 lb (75.3 kg)   LMP 06/27/2024 (Approximate)   SpO2 100%   BMI 28.49 kg/m         Assessment & Plan:   [name] in today with chief complaint of Vaginitis   1. Acute vaginitis Avoid bubble baths - WET PREP FOR TRICH, YEAST, CLUE - Urinalysis, Routine w reflex microscopic - Urine Culture  2. Vaginal candidiasis Preventative from taking flagyl  - fluconazole  (DIFLUCAN ) 150 MG tablet; 1 po q week x 4 weeks  Dispense: 4 tablet; Refill: 0  3. Bacterial vaginosis (Primary) No douching - metroNIDAZOLE  (FLAGYL ) 500 MG tablet; Take 1 tablet (500 mg total) by mouth 2 (two) times daily for 7 days.  Dispense: 14 tablet; Refill: 0    The above assessment and management plan was discussed with the patient. The patient verbalized understanding of and has agreed to the management plan. Patient is aware to call the clinic if symptoms persist or worsen. Patient is  aware when to return to the clinic for a follow-up visit. Patient educated on when it is appropriate to go to the emergency department.   [name], FNP

## 2024-07-06 ENCOUNTER — Telehealth: Payer: Self-pay

## 2024-07-06 LAB — URINE CULTURE

## 2024-07-06 MED ORDER — CEPHALEXIN 500 MG PO CAPS: 500.0000 mg | ORAL_CAPSULE | Freq: Four times a day (QID) | ORAL | 0 refills | Status: DC

## 2024-07-06 NOTE — Telephone Encounter (Signed)
 Pt states that she is already taking Metronidazole  and Fluconazole  and is nervous about taking another ATB. States that she has a h/o terrible gi issues with ATB's. Pt states that she also usually takes Macrobid  for a UTI.

## 2024-07-06 NOTE — Telephone Encounter (Signed)
 Message has been sent to Dr. Maryanne who is covering Ronal Quant today.

## 2024-07-06 NOTE — Telephone Encounter (Signed)
 Copied from CRM [id number]. Topic: General - Other >> Jul 06, 2024 12:02 PM Santiya F wrote: Reason for CRM: Patient is calling in requesting to speak with Rosina. Patient has been messaging her on MyChart and wanted to speak with her directly. Please follow up with patient.

## 2024-07-23 ENCOUNTER — Other Ambulatory Visit: Payer: Self-pay | Admitting: Nurse Practitioner

## 2024-07-23 DIAGNOSIS — J4530 Mild persistent asthma, uncomplicated: Secondary | ICD-10-CM

## 2024-07-26 ENCOUNTER — Other Ambulatory Visit: Payer: Self-pay

## 2024-07-26 DIAGNOSIS — J4530 Mild persistent asthma, uncomplicated: Secondary | ICD-10-CM

## 2024-07-26 MED ORDER — MONTELUKAST SODIUM 10 MG PO TABS: ORAL_TABLET | ORAL | 1 refills | Status: AC

## 2024-07-28 ENCOUNTER — Other Ambulatory Visit: Payer: Self-pay | Admitting: Nurse Practitioner

## 2024-07-28 DIAGNOSIS — J4530 Mild persistent asthma, uncomplicated: Secondary | ICD-10-CM

## 2024-07-29 ENCOUNTER — Other Ambulatory Visit: Payer: Self-pay | Admitting: Nurse Practitioner

## 2024-07-29 DIAGNOSIS — J4530 Mild persistent asthma, uncomplicated: Secondary | ICD-10-CM

## 2024-07-29 MED ORDER — STIOLTO RESPIMAT 2.5-2.5 MCG/ACT IN AERS: 2.0000 | INHALATION_SPRAY | Freq: Every day | RESPIRATORY_TRACT | 0 refills | Status: DC

## 2024-07-29 NOTE — Telephone Encounter (Signed)
 Name from pharmacy: STIOLTO RESPIMAT  INHALER (60)  Pharmacy comment: Alternative Requested:INSURANCE WILL COVER A MAX OF 2 PUFFS DAILY.

## 2024-08-01 ENCOUNTER — Encounter: Payer: Self-pay | Admitting: Nurse Practitioner

## 2024-08-01 ENCOUNTER — Ambulatory Visit: Admitting: Nurse Practitioner

## 2024-08-01 VITALS — BP 153/90 | HR 86 | Temp 97.7°F | Ht 64.0 in | Wt 168.0 lb

## 2024-08-01 DIAGNOSIS — N898 Other specified noninflammatory disorders of vagina: Secondary | ICD-10-CM

## 2024-08-01 DIAGNOSIS — R300 Dysuria: Secondary | ICD-10-CM

## 2024-08-01 LAB — URINALYSIS, ROUTINE W REFLEX MICROSCOPIC
Bilirubin, UA: NEGATIVE
Glucose, UA: NEGATIVE
Ketones, UA: NEGATIVE
Leukocytes,UA: NEGATIVE
Nitrite, UA: NEGATIVE
Protein,UA: NEGATIVE
RBC, UA: NEGATIVE
Specific Gravity, UA: 1.015 (ref 1.005–1.030)
Urobilinogen, Ur: 0.2 mg/dL (ref 0.2–1.0)
pH, UA: 7 (ref 5.0–7.5)

## 2024-08-01 LAB — WET PREP FOR TRICH, YEAST, CLUE
Clue Cell Exam: NEGATIVE
Trichomonas Exam: NEGATIVE
Yeast Exam: NEGATIVE

## 2024-08-01 NOTE — Progress Notes (Signed)
   Subjective:    Patient ID: Christine [name], female    DOB: [birth year], 48 y.o.   MRN: [id number]   Chief Complaint: dysuria  HPI  Patient in c/o dysuria and urinary frequency. Denies vaginal dischare. Denies fishy smell. Patient Active Problem List   Diagnosis Date Noted   Candidiasis 02/24/2024   Vaginal itching 02/24/2024   B12 deficiency 05/15/2023   Allergy to alpha-gal 10/27/2022   Primary hypertension 06/13/2021   Weight gain due to medication 09/13/2020   Insomnia 04/27/2020   Gastroesophageal reflux disease 09/13/2018   TMJ pain dysfunction syndrome 09/25/2017   Allergic rhinitis 02/18/2016   White coat syndrome with high blood pressure without hypertension 09/06/2015   BMI 32.0-32.9,adult 09/06/2015   Mammographic breast lesion 11/24/2014   Mild persistent asthma, uncomplicated 02/10/2013   GAD (generalized anxiety disorder) 02/10/2013   Vitamin D  deficiency        Review of Systems  Constitutional:  Negative for diaphoresis.  Eyes:  Negative for pain.  Respiratory:  Negative for shortness of breath.   Cardiovascular:  Negative for chest pain, palpitations and leg swelling.  Gastrointestinal:  Negative for abdominal pain.  Endocrine: Negative for polydipsia.  Skin:  Negative for rash.  Neurological:  Negative for dizziness, weakness and headaches.  Hematological:  Does not bruise/bleed easily.  All other systems reviewed and are negative.      Objective:   Physical Exam Constitutional:      Appearance: Normal appearance.  Cardiovascular:     Rate and Rhythm: Normal rate and regular rhythm.     Heart sounds: Normal heart sounds.  Pulmonary:     Effort: Pulmonary effort is normal.     Breath sounds: Normal breath sounds.  Abdominal:     General: Abdomen is flat.     Palpations: Abdomen is soft.     Tenderness: There is no right CVA tenderness or left CVA tenderness.  Neurological:     General: No focal deficit present.     Mental Status: She is  alert and oriented to person, place, and time.  Psychiatric:        Mood and Affect: Mood normal.        Behavior: Behavior normal.     BP (!) 153/90   Pulse 86   Temp 97.7 F (36.5 C) (Temporal)   Ht 5' 4 (1.626 m)   Wt 168 lb (76.2 kg)   LMP 06/27/2024 (Approximate)   BMI 28.84 kg/m   Urine clear Wet prep- many bacterial      Assessment & Plan:   [name] in today with chief complaint of No chief complaint on file.   1. Dysuria (Primary) - Urinalysis, Routine w reflex microscopic - Urine Culture  2. Vaginal discharge Do medicated douche 1x\- vinegar douche let me know if no improvement - WET PREP FOR TRICH, YEAST, CLUE     The above assessment and management plan was discussed with the patient. The patient verbalized understanding of and has agreed to the management plan. Patient is aware to call the clinic if symptoms persist or worsen. Patient is aware when to return to the clinic for a follow-up visit. Patient educated on when it is appropriate to go to the emergency department.   Christine Gladis, FNP

## 2024-08-03 LAB — URINE CULTURE: Organism ID, Bacteria: NO GROWTH

## 2024-08-04 ENCOUNTER — Ambulatory Visit: Payer: Self-pay | Admitting: Nurse Practitioner

## 2024-08-12 NOTE — Telephone Encounter (Signed)
 Patient asking for a refill on her Flucanazol seen mmm for this

## 2024-08-15 ENCOUNTER — Ambulatory Visit: Payer: Self-pay | Admitting: Nurse Practitioner

## 2024-08-15 ENCOUNTER — Telehealth: Payer: Self-pay

## 2024-08-15 ENCOUNTER — Ambulatory Visit: Admitting: Nurse Practitioner

## 2024-08-15 ENCOUNTER — Ambulatory Visit: Payer: Self-pay

## 2024-08-15 VITALS — BP 160/99 | HR 75 | Temp 97.1°F | Ht 64.0 in | Wt 168.0 lb

## 2024-08-15 DIAGNOSIS — B9689 Other specified bacterial agents as the cause of diseases classified elsewhere: Secondary | ICD-10-CM

## 2024-08-15 DIAGNOSIS — N760 Acute vaginitis: Secondary | ICD-10-CM | POA: Diagnosis not present

## 2024-08-15 DIAGNOSIS — N898 Other specified noninflammatory disorders of vagina: Secondary | ICD-10-CM | POA: Diagnosis not present

## 2024-08-15 DIAGNOSIS — R300 Dysuria: Secondary | ICD-10-CM

## 2024-08-15 DIAGNOSIS — N3000 Acute cystitis without hematuria: Secondary | ICD-10-CM

## 2024-08-15 LAB — URINALYSIS, ROUTINE W REFLEX MICROSCOPIC
Bilirubin, UA: NEGATIVE
Glucose, UA: NEGATIVE
Ketones, UA: NEGATIVE
Nitrite, UA: POSITIVE — AB
Protein,UA: NEGATIVE
Specific Gravity, UA: 1.01 (ref 1.005–1.030)
Urobilinogen, Ur: 0.2 mg/dL (ref 0.2–1.0)
pH, UA: 6.5 (ref 5.0–7.5)

## 2024-08-15 LAB — WET PREP FOR TRICH, YEAST, CLUE
Clue Cell Exam: POSITIVE — AB
Trichomonas Exam: NEGATIVE
Yeast Exam: NEGATIVE

## 2024-08-15 LAB — MICROSCOPIC EXAMINATION: RBC, Urine: NONE SEEN /HPF (ref 0–2)

## 2024-08-15 MED ORDER — NITROFURANTOIN MONOHYD MACRO 100 MG PO CAPS: 100.0000 mg | ORAL_CAPSULE | Freq: Two times a day (BID) | ORAL | 0 refills | Status: DC

## 2024-08-15 MED ORDER — METRONIDAZOLE 500 MG PO TABS: 500.0000 mg | ORAL_TABLET | Freq: Two times a day (BID) | ORAL | 0 refills | Status: AC

## 2024-08-15 MED ORDER — FLUCONAZOLE 150 MG PO TABS: ORAL_TABLET | ORAL | 0 refills | Status: DC

## 2024-08-15 NOTE — Telephone Encounter (Signed)
 Appt today with MMM

## 2024-08-15 NOTE — Patient Instructions (Signed)

## 2024-08-15 NOTE — Telephone Encounter (Signed)
 error

## 2024-08-15 NOTE — Telephone Encounter (Signed)
 FYI Only or Action Required?: FYI only for provider.  Patient was last seen in primary care on 08/01/2024 by Christine Mustard, FNP.  Called Nurse Triage reporting Urinary Frequency.  Symptoms began x Friday.  Interventions attempted: Nothing.  Symptoms are: gradually worsening.  Triage Disposition: See Physician Within 24 Hours  Patient/caregiver understands and will follow disposition?: Yes     Copied from CRM [id number]. Topic: Clinical - Red Word Triage >> Aug 15, 2024  7:36 AM Christine [name] wrote: Red Word that prompted transfer to Nurse Triage: Patient thinks she has a UTI, burning when urinating, increased frequency, lower back pain, and nausea. Reason for Disposition  Urinating more frequently than usual (i.e., frequency) OR new-onset of the feeling of an urgent need to urinate (i.e., urgency)  Answer Assessment - Initial Assessment Questions 1. SYMPTOM: What's the main symptom you're concerned about? (e.g., frequency, incontinence)      burning when urinating, increased frequency 2. ONSET: When did the   start?     X Friday 3. PAIN: Is there any pain? If Yes, ask: How bad is it? (Scale: 1-10; mild, moderate, severe)     7 to 8/10  4. CAUSE: What do you think is causing the symptoms?     UTI 5. OTHER SYMPTOMS: Do you have any other symptoms? (e.g., blood in urine, fever, flank pain, pain with urination)     lower back pain, and nausea. 6. PREGNANCY: Is there any chance you are pregnant? When was your last menstrual period?     no  Protocols used: Urinary Symptoms-A-AH

## 2024-08-15 NOTE — Progress Notes (Signed)
 Subjective:    Patient ID: Christine [name], female    DOB: 1976/09/12, 48 y.o.   MRN: [id number]   Chief Complaint: Dysuria (Positive UTI test at home)   Dysuria  This is a new problem. The current episode started in the past 7 days. The problem occurs intermittently. The problem has been waxing and waning. The quality of the pain is described as burning. The pain is at a severity of 7/10. The pain is moderate. There has been no fever. She is Sexually active. There is No history of pyelonephritis. Associated symptoms include a discharge, frequency and urgency. She has tried nothing for the symptoms. The treatment provided no relief.    Patient Active Problem List   Diagnosis Date Noted   Candidiasis 02/24/2024   Vaginal itching 02/24/2024   B12 deficiency 05/15/2023   Allergy to alpha-gal 10/27/2022   Primary hypertension 06/13/2021   Weight gain due to medication 09/13/2020   Insomnia 04/27/2020   Gastroesophageal reflux disease 09/13/2018   TMJ pain dysfunction syndrome 09/25/2017   Allergic rhinitis 02/18/2016   White coat syndrome with high blood pressure without hypertension 09/06/2015   BMI 32.0-32.9,adult 09/06/2015   Mammographic breast lesion 11/24/2014   Mild persistent asthma, uncomplicated 02/10/2013   GAD (generalized anxiety disorder) 02/10/2013   Vitamin D  deficiency        Review of Systems  Constitutional:  Negative for fever.  Genitourinary:  Positive for dysuria, frequency, urgency and vaginal discharge.       Objective:   Physical Exam Constitutional:      Appearance: Normal appearance.  Cardiovascular:     Rate and Rhythm: Normal rate and regular rhythm.     Heart sounds: Normal heart sounds.  Pulmonary:     Effort: Pulmonary effort is normal.     Breath sounds: Normal breath sounds.  Abdominal:     Tenderness: There is no right CVA tenderness or left CVA tenderness.  Skin:    General: Skin is warm.  Neurological:     General: No focal  deficit present.     Mental Status: She is alert and oriented to person, place, and time.  Psychiatric:        Mood and Affect: Mood normal.        Behavior: Behavior normal.    BP (!) 160/99   Pulse 75   Temp (!) 97.1 F (36.2 C) (Temporal)   Ht 5' 4 (1.626 m)   Wt 168 lb (76.2 kg)   SpO2 98%   BMI 28.84 kg/m   Wet prep - cue cells UA- positive nitrites      Assessment & Plan:   Christine [name] in today with chief complaint of Dysuria (Positive UTI test at home)   1. Dysuria (Primary) - Urinalysis, Routine w reflex microscopic - Urine Culture  2. Acute cystitis without hematuria Take medication as prescribe Cotton underwear Take shower not bath Cranberry juice, yogurt Force fluids AZO over the counter X2 days Culture pending RTO prn  - nitrofurantoin , macrocrystal-monohydrate, (MACROBID ) 100 MG capsule; Take 1 capsule (100 mg total) by mouth 2 (two) times daily. 1 po BId  Dispense: 14 capsule; Refill: 0  3. Vaginal itching - WET PREP FOR TRICH, YEAST, CLUE  4. Bacterial vaginosis No douching No bubble bath - metroNIDAZOLE  (FLAGYL ) 500 MG tablet; Take 1 tablet (500 mg total) by mouth 2 (two) times daily for 7 days.  Dispense: 14 tablet; Refill: 0    The above assessment and management  plan was discussed with the patient. The patient verbalized understanding of and has agreed to the management plan. Patient is aware to call the clinic if symptoms persist or worsen. Patient is aware when to return to the clinic for a follow-up visit. Patient educated on when it is appropriate to go to the emergency department.   [name], FNP

## 2024-08-15 NOTE — Addendum Note (Signed)
 Addended by: GLADIS MUSTARD on: 08/15/2024 08:35 AM   Modules accepted: Orders

## 2024-08-18 ENCOUNTER — Telehealth: Payer: Self-pay | Admitting: Nurse Practitioner

## 2024-08-18 LAB — URINE CULTURE

## 2024-08-18 NOTE — Telephone Encounter (Unsigned)
 Copied from CRM [id number]. Topic: Clinical - Lab/Test Results >> Aug 18, 2024  3:10 PM Christine [name] wrote: Reason for CRM: Pt calling regarding lab results from 08/15/2024. Please call pt at [phone].

## 2024-08-18 NOTE — Telephone Encounter (Signed)
 Patient has a duplicate mychart message. Please refer to that

## 2024-09-06 ENCOUNTER — Ambulatory Visit: Admitting: Nurse Practitioner

## 2024-09-06 ENCOUNTER — Encounter: Payer: Self-pay | Admitting: Nurse Practitioner

## 2024-09-06 ENCOUNTER — Other Ambulatory Visit: Payer: Self-pay | Admitting: Nurse Practitioner

## 2024-09-06 VITALS — BP 163/88 | HR 78 | Temp 97.2°F | Ht 64.0 in | Wt 170.0 lb

## 2024-09-06 DIAGNOSIS — J4530 Mild persistent asthma, uncomplicated: Secondary | ICD-10-CM

## 2024-09-06 DIAGNOSIS — R300 Dysuria: Secondary | ICD-10-CM | POA: Diagnosis not present

## 2024-09-06 DIAGNOSIS — B3731 Acute candidiasis of vulva and vagina: Secondary | ICD-10-CM | POA: Diagnosis not present

## 2024-09-06 LAB — MICROSCOPIC EXAMINATION
RBC, Urine: NONE SEEN /HPF (ref 0–2)
Renal Epithel, UA: NONE SEEN /HPF
WBC, UA: NONE SEEN /HPF (ref 0–5)
Yeast, UA: NONE SEEN

## 2024-09-06 LAB — URINALYSIS, ROUTINE W REFLEX MICROSCOPIC
Bilirubin, UA: NEGATIVE
Glucose, UA: NEGATIVE
Ketones, UA: NEGATIVE
Leukocytes,UA: NEGATIVE
Nitrite, UA: NEGATIVE
Protein,UA: NEGATIVE
RBC, UA: NEGATIVE
Specific Gravity, UA: 1.01 (ref 1.005–1.030)
Urobilinogen, Ur: 0.2 mg/dL (ref 0.2–1.0)
pH, UA: 7 (ref 5.0–7.5)

## 2024-09-06 LAB — WET PREP FOR TRICH, YEAST, CLUE
Clue Cell Exam: NEGATIVE
Trichomonas Exam: NEGATIVE
Yeast Exam: POSITIVE — AB

## 2024-09-06 NOTE — Progress Notes (Signed)
   Subjective:    Patient ID: Christine [name], female    DOB: Feb 21, 1976, 48 y.o.   MRN: [id number]   Chief Complaint: Dysuria   Dysuria     Patient comes in today still c/o dysuria and discharge. She has been treated 3x for vaginal discharge and uti in the last several months. She was given Macrobid  at last visit . Urine culture grew Enterococcus Faecalia which is susceptible to Macrobid .  Patient Active Problem List   Diagnosis Date Noted   Candidiasis 02/24/2024   Vaginal itching 02/24/2024   B12 deficiency 05/15/2023   Allergy to alpha-gal 10/27/2022   Primary hypertension 06/13/2021   Weight gain due to medication 09/13/2020   Insomnia 04/27/2020   Gastroesophageal reflux disease 09/13/2018   TMJ pain dysfunction syndrome 09/25/2017   Allergic rhinitis 02/18/2016   White coat syndrome with high blood pressure without hypertension 09/06/2015   BMI 32.0-32.9,adult 09/06/2015   Mammographic breast lesion 11/24/2014   Mild persistent asthma, uncomplicated 02/10/2013   GAD (generalized anxiety disorder) 02/10/2013   Vitamin D  deficiency        Review of Systems  Constitutional:  Negative for diaphoresis.  Eyes:  Negative for pain.  Respiratory:  Negative for shortness of breath.   Cardiovascular:  Negative for chest pain, palpitations and leg swelling.  Gastrointestinal:  Negative for abdominal pain.  Endocrine: Negative for polydipsia.  Genitourinary:  Positive for dysuria.  Skin:  Negative for rash.  Neurological:  Negative for dizziness, weakness and headaches.  Hematological:  Does not bruise/bleed easily.  All other systems reviewed and are negative.      Objective:   Physical Exam Constitutional:      Appearance: Normal appearance.  Cardiovascular:     Rate and Rhythm: Normal rate and regular rhythm.     Heart sounds: Normal heart sounds.  Pulmonary:     Breath sounds: Normal breath sounds.  Skin:    General: Skin is warm.  Neurological:     General:  No focal deficit present.     Mental Status: She is alert and oriented to person, place, and time.  Psychiatric:        Mood and Affect: Mood normal.        Behavior: Behavior normal.    BP (!) 163/88   Pulse 78   Temp (!) 97.2 F (36.2 C) (Temporal)   Ht 5' 4 (1.626 m)   Wt 170 lb (77.1 kg)   SpO2 98%   BMI 29.18 kg/m         Assessment & Plan:   [name] in today with chief complaint of Dysuria   1. Dysuria (Primary) - Urinalysis, Routine w reflex microscopic - Urine Culture - WET PREP FOR TRICH, YEAST, CLUE  2. Vaginal candidiasis Proper hygiene Force fluids Void after intercourse Avoid bubble baths and hot tubs Take diflucan  as prescribed.  RTO prn    The above assessment and management plan was discussed with the patient. The patient verbalized understanding of and has agreed to the management plan. Patient is aware to call the clinic if symptoms persist or worsen. Patient is aware when to return to the clinic for a follow-up visit. Patient educated on when it is appropriate to go to the emergency department.   [name], FNP

## 2024-09-08 ENCOUNTER — Ambulatory Visit: Payer: Self-pay | Admitting: Nurse Practitioner

## 2024-09-08 LAB — URINE CULTURE

## 2024-09-17 DIAGNOSIS — F321 Major depressive disorder, single episode, moderate: Secondary | ICD-10-CM | POA: Diagnosis not present

## 2024-09-22 ENCOUNTER — Encounter: Payer: Self-pay | Admitting: Family Medicine

## 2024-09-22 ENCOUNTER — Ambulatory Visit: Payer: Self-pay | Admitting: Family Medicine

## 2024-09-22 ENCOUNTER — Ambulatory Visit: Admitting: Family Medicine

## 2024-09-22 VITALS — BP 157/93 | HR 89 | Temp 97.8°F | Wt 169.0 lb

## 2024-09-22 DIAGNOSIS — R102 Pelvic and perineal pain unspecified side: Secondary | ICD-10-CM

## 2024-09-22 LAB — WET PREP FOR TRICH, YEAST, CLUE
Clue Cell Exam: NEGATIVE
Trichomonas Exam: NEGATIVE
Yeast Exam: POSITIVE — AB

## 2024-09-22 MED ORDER — FLUCONAZOLE 100 MG PO TABS: ORAL_TABLET | ORAL | 0 refills | Status: DC

## 2024-09-22 NOTE — Progress Notes (Signed)
 Subjective:  Patient ID: Christine [name], female    DOB: 02/05/1976  Age: 48 y.o. MRN: [id number]  CC: Vaginitis ( Seen on 10/14 for this w/ MM. Abx given for uti and BV. After that she got a yeast infection so she took fluconazole  and monistat . Monistat  seemed to make worse. Still having burning. )   HPI  Discussed the use of AI scribe software for clinical note transcription with the patient, who gave verbal consent to proceed.  History of Present Illness Christine [name] is a 48 year old female with recurrent urinary tract infections who presents with a burning sensation in the pelvic area and recurrent yeast infections.  She experiences a burning sensation in the pelvic area, specifically extending into the vagina, without any discharge. This burning sensation has persisted despite previous treatments, including fluconazole  for a urinary tract infection and yeast infection a few weeks ago. She took one dose of fluconazole  on the first day of antibiotics and another on the last day, but the burning sensation persisted a few days after completing the treatment.  Recently, she used a mitoxantrone cream, which exacerbated the burning sensation. The burning was worse on the day of application but has since slightly improved, though it remains bothersome.  She has a history of recurrent urinary tract infections over the past few months, which have been distressing but seem to have resolved recently. She reports lower back pain, which she attributes to her sitting posture rather than a chronic issue.  She is married and notes that the condition is impacting her sexual activity.          09/06/2024    2:57 PM 08/15/2024   11:33 AM 07/04/2024   11:53 AM  Depression screen PHQ 2/9  Decreased Interest 0 0 1  Down, Depressed, Hopeless 1 1 1   PHQ - 2 Score 1 1 2   Altered sleeping 1 0 0  Tired, decreased energy 1 1 2   Change in appetite 0 2 0  Feeling bad or failure about yourself  1 1 1    Trouble concentrating 0 1 0  Moving slowly or fidgety/restless 0 0 0  Suicidal thoughts 0 0 0  PHQ-9 Score 4 6 5   Difficult doing work/chores Not difficult at all Somewhat difficult Not difficult at all    History Iyona has a past medical history of Allergy, Asthma, GAD (generalized anxiety disorder), GERD (gastroesophageal reflux disease) (2018), and Vitamin D  deficiency.   She has a past surgical history that includes Cesarean section; Tonsillectomy and adenoidectomy; and Breast biopsy (Right, 07/2015).   Her family history includes Asthma in her paternal grandfather; Diabetes in her mother; Hyperlipidemia in her father; Hypertension in her father and mother.She reports that she has never smoked. She has never used smokeless tobacco. She reports current alcohol use. She reports that she does not use drugs.    ROS Review of Systems  Genitourinary:  Positive for vaginal pain.    Objective:  BP (!) 157/93   Pulse 89   Temp 97.8 F (36.6 C)   Wt 169 lb (76.7 kg)   SpO2 99%   BMI 29.01 kg/m   BP Readings from Last 3 Encounters:  09/22/24 (!) 157/93  09/06/24 (!) 163/88  08/15/24 (!) 160/99    Wt Readings from Last 3 Encounters:  09/22/24 169 lb (76.7 kg)  09/06/24 170 lb (77.1 kg)  08/15/24 168 lb (76.2 kg)     Physical Exam Constitutional:      General:  She is not in acute distress.    Appearance: She is well-developed.  Cardiovascular:     Rate and Rhythm: Normal rate and regular rhythm.  Pulmonary:     Breath sounds: Normal breath sounds.  Musculoskeletal:        General: Normal range of motion.  Skin:    General: Skin is warm and dry.  Neurological:     Mental Status: She is alert and oriented to person, place, and time.    Physical Exam GENERAL: Alert, cooperative, well developed, no acute distress. HEENT: Normocephalic, normal oropharynx, moist mucous membranes. CHEST: Clear to auscultation bilaterally, no wheezes, rhonchi, or  crackles. CARDIOVASCULAR: Normal heart rate and rhythm, S1 and S2 normal without murmurs. ABDOMEN: Soft, non-tender, non-distended, without organomegaly, normal bowel sounds. No costovertebral angle tenderness. No bladder tenderness. EXTREMITIES: No cyanosis or edema. NEUROLOGICAL: Cranial nerves grossly intact, moves all extremities without gross motor or sensory deficit.   Assessment & Plan:  Vaginal pain -     WET PREP FOR TRICH, YEAST, CLUE  Other orders -     Fluconazole ; Take two with first dose. Then starting the next day take one daily until all are taken.  Dispense: 15 tablet; Refill: 0    Assessment and Plan Assessment & Plan Vulvovaginal candidiasis   Recurrent vulvovaginal candidiasis presents with a burning sensation in the pelvic region and increased white discharge. No bacterial vaginosis or trichomonas detected. Prescribe fluconazole  100 mg: take two tablets on the first day, then one tablet daily for 13 days. Advise using condoms with water-based lubricant during intercourse to prevent transmission to her partner. Instruct her partner to seek treatment if experiencing symptoms such as itching or rash.       Follow-up: Return if symptoms worsen or fail to improve.  Butler Der, M.D.

## 2024-09-25 ENCOUNTER — Encounter: Payer: Self-pay | Admitting: Family Medicine

## 2024-10-04 ENCOUNTER — Ambulatory Visit: Payer: Self-pay | Admitting: Nurse Practitioner

## 2024-10-06 ENCOUNTER — Encounter: Payer: Self-pay | Admitting: Nurse Practitioner

## 2024-10-06 ENCOUNTER — Ambulatory Visit: Admitting: Nurse Practitioner

## 2024-10-06 VITALS — BP 143/85 | HR 72 | Temp 97.5°F | Ht 64.0 in | Wt 171.0 lb

## 2024-10-06 DIAGNOSIS — E538 Deficiency of other specified B group vitamins: Secondary | ICD-10-CM

## 2024-10-06 DIAGNOSIS — K219 Gastro-esophageal reflux disease without esophagitis: Secondary | ICD-10-CM

## 2024-10-06 DIAGNOSIS — I10 Essential (primary) hypertension: Secondary | ICD-10-CM

## 2024-10-06 DIAGNOSIS — F411 Generalized anxiety disorder: Secondary | ICD-10-CM | POA: Diagnosis not present

## 2024-10-06 DIAGNOSIS — F5101 Primary insomnia: Secondary | ICD-10-CM

## 2024-10-06 DIAGNOSIS — B3731 Acute candidiasis of vulva and vagina: Secondary | ICD-10-CM | POA: Diagnosis not present

## 2024-10-06 LAB — WET PREP FOR TRICH, YEAST, CLUE
Clue Cell Exam: NEGATIVE
Trichomonas Exam: NEGATIVE
Yeast Exam: POSITIVE — AB

## 2024-10-06 MED ORDER — FLUCONAZOLE 150 MG PO TABS: 150.0000 mg | ORAL_TABLET | Freq: Once | ORAL | 0 refills | Status: DC

## 2024-10-06 MED ORDER — FAMOTIDINE 40 MG PO TABS
40.0000 mg | ORAL_TABLET | Freq: Two times a day (BID) | ORAL | 5 refills | Status: AC
Start: 2024-10-06 — End: ?

## 2024-10-06 MED ORDER — PANTOPRAZOLE SODIUM 40 MG PO TBEC: 40.0000 mg | DELAYED_RELEASE_TABLET | Freq: Every day | ORAL | 1 refills | Status: AC

## 2024-10-06 MED ORDER — ZOLPIDEM TARTRATE 10 MG PO TABS: 10.0000 mg | ORAL_TABLET | Freq: Every day | ORAL | 5 refills | Status: AC

## 2024-10-06 MED ORDER — FLUCONAZOLE 100 MG PO TABS: 100.0000 mg | ORAL_TABLET | Freq: Every day | ORAL | 1 refills | Status: AC

## 2024-10-06 MED ORDER — HYDROCHLOROTHIAZIDE 12.5 MG PO CAPS
12.5000 mg | ORAL_CAPSULE | Freq: Every day | ORAL | 1 refills | Status: AC
Start: 2024-10-06 — End: ?

## 2024-10-06 MED ORDER — ALPRAZOLAM 0.25 MG PO TABS: ORAL_TABLET | ORAL | 2 refills | Status: AC

## 2024-10-06 MED ORDER — VILAZODONE HCL 20 MG PO TABS: 1.0000 | ORAL_TABLET | Freq: Every day | ORAL | 1 refills | Status: AC

## 2024-10-06 NOTE — Progress Notes (Signed)
 Subjective:    Patient ID: Christine [name], female    DOB: 06-07-1976, 48 y.o.   MRN: [id number]   Chief Complaint: medical management of chronic issues     HPI:  Christine [name] is a 48 y.o. who identifies as a female who was assigned female at birth.   Social history: Lives with: husband    Comes in today for follow up of the following chronic medical issues:  1. Primary hypertension No c/o chest pain, sob or headache. Does not check blood pressure at home BP Readings from Last 3 Encounters:  09/22/24 (!) 157/93  09/06/24 (!) 163/88  08/15/24 (!) 160/99     2. Gastroesophageal reflux disease, unspecified whether esophagitis present Is on protonix  daily  3. Generalized anxiety disorder Takes combination of xanax  and viibryd . Seems to be dong well    09/06/2024    2:58 PM 08/15/2024   11:33 AM 07/04/2024   11:53 AM 04/12/2024    3:30 PM  GAD 7 : Generalized Anxiety Score  Nervous, Anxious, on Edge 1 1 1 2   Control/stop worrying 1 1 1 2   Worry too much - different things 1 1 2 2   Trouble relaxing 0 1 2 1   Restless 1 0 1 2  Easily annoyed or irritable 1 1 2 2   Afraid - awful might happen 0 1 0 2  Total GAD 7 Score 5 6 9 13   Anxiety Difficulty Not difficult at all Somewhat difficult Somewhat difficult Not difficult at all      4. Primary insomnia Is on ambien   to sleep at night. Is not able to sleep without meds  5. Allergy to alpha-gal Still continues to avoid red meat  6. B12 deficiency Lab Results  Component Value Date   VITAMINB12 600 10/19/2023     7. Vaginal candidiasis Has had issues for several months. Has been treated with diflucan  several  times.   8. BMI 32.0-32.9,adult No recent weight changes  Wt Readings from Last 3 Encounters:  10/06/24 171 lb (77.6 kg)  09/22/24 169 lb (76.7 kg)  09/06/24 170 lb (77.1 kg)   BMI Readings from Last 3 Encounters:  10/06/24 29.35 kg/m  09/22/24 29.01 kg/m  09/06/24 29.18 kg/m      New  complaints: None today  Allergies  Allergen Reactions   Sulfa Antibiotics Hives   Sulfasalazine Hives   Alpha-Gal    Doxycycline  Hyclate    Ivp Dye [Iodinated Contrast Media] Hives   Other     ALPHA GAL - MEAT ALLERGY    Paxil [Paroxetine Hcl] Other (See Comments)    Made pt very angry    Phosphorus Diarrhea   Outpatient Encounter Medications as of 10/06/2024  Medication Sig   albuterol  (PROVENTIL ) (2.5 MG/3ML) 0.083% nebulizer solution Take 3 mLs (2.5 mg total) by nebulization every 4 (four) hours as needed for wheezing or shortness of breath.   ALPRAZolam  (XANAX ) 0.25 MG tablet 1 po daily prn   Cholecalciferol (VITAMIN D3) 1000 units CAPS Take by mouth.   Digestive Enzymes (DIGESTIVE ENZYME PO) Take 3 each by mouth daily.   EPINEPHrine  (EPIPEN  2-PAK) 0.3 mg/0.3 mL IJ SOAJ injection Inject 0.3 mg into the muscle as needed for anaphylaxis.   ethynodiol-ethinyl estradiol  (KELNOR 1/35) 1-35 MG-MCG tablet Take 1 tablet by mouth daily. Patient does not take placebo pills and continuously takes birth control   famotidine  (PEPCID ) 40 MG tablet Take 1 tablet (40 mg total) by mouth in the morning and at  bedtime.   fluconazole  (DIFLUCAN ) 100 MG tablet Take two with first dose. Then starting the next day take one daily until all are taken.   hydrochlorothiazide  (MICROZIDE ) 12.5 MG capsule Take 1 capsule (12.5 mg total) by mouth daily.   montelukast  (SINGULAIR ) 10 MG tablet TAKE 1 TABLET BY MOUTH EVERYDAY AT BEDTIME   pantoprazole  (PROTONIX ) 40 MG tablet Take 1 tablet (40 mg total) by mouth daily.   polyethylene glycol powder (GLYCOLAX /MIRALAX ) 17 GM/SCOOP powder Take 17 g by mouth daily.   Tiotropium Bromide -Olodaterol (STIOLTO RESPIMAT ) 2.5-2.5 MCG/ACT AERS INHALE 2 PUFFS BY MOUTH INTO THE LUNGS DAILY   VENTOLIN  HFA 108 (90 Base) MCG/ACT inhaler TAKE 2 PUFFS BY MOUTH EVERY 6 HOURS AS NEEDED FOR WHEEZE OR SHORTNESS OF BREATH   Vilazodone  HCl 20 MG TABS Take 1 tablet (20 mg total) by mouth  daily.   zolpidem  (AMBIEN ) 10 MG tablet Take 1 tablet (10 mg total) by mouth at bedtime.   No facility-administered encounter medications on file as of 10/06/2024.    Past Surgical History:  Procedure Laterality Date   BREAST BIOPSY Right 07/2015   fibrocystic changes   CESAREAN SECTION     TONSILLECTOMY AND ADENOIDECTOMY      Family History  Problem Relation Age of Onset   Diabetes Mother    Hypertension Mother    Hyperlipidemia Father    Hypertension Father    Asthma Paternal Grandfather    Allergic rhinitis Neg Hx    Angioedema Neg Hx    Eczema Neg Hx    Immunodeficiency Neg Hx    Urticaria Neg Hx       Controlled substance contract: 05/13/23     Review of Systems  Constitutional:  Negative for diaphoresis.  Eyes:  Negative for pain.  Respiratory:  Negative for shortness of breath.   Cardiovascular:  Negative for chest pain, palpitations and leg swelling.  Gastrointestinal:  Negative for abdominal pain.  Endocrine: Negative for polydipsia.  Skin:  Negative for rash.  Neurological:  Negative for dizziness, weakness and headaches.  Hematological:  Does not bruise/bleed easily.  All other systems reviewed and are negative.      Objective:   Physical Exam Vitals and nursing note reviewed.  Constitutional:      General: She is not in acute distress.    Appearance: Normal appearance. She is well-developed.  HENT:     Head: Normocephalic.     Right Ear: Tympanic membrane normal.     Left Ear: Tympanic membrane normal.     Nose: Nose normal.     Mouth/Throat:     Mouth: Mucous membranes are moist.  Eyes:     Pupils: Pupils are equal, round, and reactive to light.  Neck:     Vascular: No carotid bruit or JVD.  Cardiovascular:     Rate and Rhythm: Normal rate and regular rhythm.     Heart sounds: Normal heart sounds.  Pulmonary:     Effort: Pulmonary effort is normal. No respiratory distress.     Breath sounds: Normal breath sounds. No wheezing or rales.   Chest:     Chest wall: No tenderness.  Abdominal:     General: Bowel sounds are normal. There is no distension or abdominal bruit.     Palpations: Abdomen is soft. There is no hepatomegaly, splenomegaly, mass or pulsatile mass.     Tenderness: There is no abdominal tenderness.  Musculoskeletal:        General: Normal range of motion.  Cervical back: Normal range of motion and neck supple.  Lymphadenopathy:     Cervical: No cervical adenopathy.  Skin:    General: Skin is warm and dry.  Neurological:     Mental Status: She is alert and oriented to person, place, and time.     Deep Tendon Reflexes: Reflexes are normal and symmetric.  Psychiatric:        Behavior: Behavior normal.        Thought Content: Thought content normal.        Judgment: Judgment normal.     BP (!) 143/85   Pulse 72   Temp (!) 97.5 F (36.4 C) (Temporal)   Ht 5' 4 (1.626 m)   Wt 171 lb (77.6 kg)   SpO2 99%   BMI 29.35 kg/m        Assessment & Plan:   KEIR FOLAND comes in today with chief complaint of medical management of chronic issues    Diagnosis and orders addressed:  1. Primary hypertension Low sodium diet - CBC with Differential/Platelet - CMP14+EGFR - Lipid panel - hydrochlorothiazide  (MICROZIDE ) 12.5 MG capsule; Take 1 capsule (12.5 mg total) by mouth daily.  Dispense: 90 capsule; Refill: 1  2. Gastroesophageal reflux disease, unspecified whether esophagitis present Avoid spicy foods Do not eat 2 hours prior to bedtime  - famotidine  (PEPCID ) 40 MG tablet; Take 1 tablet (40 mg total) by mouth in the morning and at bedtime.  Dispense: 60 tablet; Refill: 5 - pantoprazole  (PROTONIX ) 40 MG tablet; Take 1 tablet (40 mg total) by mouth daily.  Dispense: 90 tablet; Refill: 1  3. Generalized anxiety disorder Stress management - ALPRAZolam  (XANAX ) 0.25 MG tablet; 1 po daily prn  Dispense: 20 tablet; Refill: 2 - Vilazodone  HCl (VIIBRYD ) 20 MG TABS; Take 1 tablet (20 mg total) by  mouth daily.  Dispense: 90 tablet; Refill: 1  4. Primary insomnia Bedtime routine - zolpidem  (AMBIEN ) 10 MG tablet; Take 1 tablet (10 mg total) by mouth at bedtime.  Dispense: 30 tablet; Refill: 5  5. Allergy to alpha-gal Continue to avoid red meat  6. B12 deficiency Labs pending Continue b12 injections - Vitamin B12  7. BMI 32.0-32.9,adult Discussed diet and exercise for person with BMI >25 Will recheck weight in 3-6 months   8. Mild persistent asthma, uncomplicated - Tiotropium Bromide -Olodaterol (STIOLTO RESPIMAT ) 2.5-2.5 MCG/ACT AERS; Inhale 2 puffs into the lungs in the morning and at bedtime.  Dispense: 4 g; Refill: 3  9. Yeast vaginitis Meds ordered this encounter  Medications   DISCONTD: fluconazole  (DIFLUCAN ) 150 MG tablet    Sig: Take 1 tablet (150 mg total) by mouth once for 1 dose.    Dispense:  1 tablet    Refill:  0    Supervising Provider:   DETTINGER, JOSHUA A [[id number]]   fluconazole  (DIFLUCAN ) 100 MG tablet    Sig: Take 1 tablet (100 mg total) by mouth daily.    Dispense:  30 tablet    Refill:  1    Supervising Provider:   MARYANNE CHEW A [id number]     Labs pending Health Maintenance reviewed Diet and exercise encouraged  Follow up plan: 6 months   [name], FNP

## 2024-10-10 ENCOUNTER — Ambulatory Visit: Payer: Self-pay | Admitting: Nurse Practitioner

## 2024-10-11 ENCOUNTER — Other Ambulatory Visit: Payer: Self-pay | Admitting: Nurse Practitioner

## 2024-10-11 DIAGNOSIS — J4530 Mild persistent asthma, uncomplicated: Secondary | ICD-10-CM

## 2024-10-15 DIAGNOSIS — F321 Major depressive disorder, single episode, moderate: Secondary | ICD-10-CM | POA: Diagnosis not present

## 2024-10-16 ENCOUNTER — Other Ambulatory Visit: Payer: Self-pay | Admitting: Nurse Practitioner

## 2024-11-01 ENCOUNTER — Ambulatory Visit

## 2024-11-01 NOTE — Telephone Encounter (Signed)
 Only thing I know to do at this point is do referral  to GYN. Where would she like to go?

## 2024-11-02 ENCOUNTER — Ambulatory Visit: Admitting: Family Medicine

## 2024-11-02 DIAGNOSIS — N898 Other specified noninflammatory disorders of vagina: Secondary | ICD-10-CM | POA: Diagnosis not present

## 2024-11-02 DIAGNOSIS — Z87440 Personal history of urinary (tract) infections: Secondary | ICD-10-CM | POA: Diagnosis not present

## 2024-11-02 DIAGNOSIS — N760 Acute vaginitis: Secondary | ICD-10-CM | POA: Diagnosis not present

## 2024-11-07 NOTE — Telephone Encounter (Signed)
 Good morning thank you for letting us  know you will not be marked as a no show. Thank you
